# Patient Record
Sex: Female | Born: 1935 | Race: White | Hispanic: No | Marital: Married | Smoking: Never smoker
Health system: Southern US, Community
[De-identification: ages and names within clinical notes are randomized; demographics above are authoritative.]

## PROBLEM LIST (undated history)

## (undated) DIAGNOSIS — K648 Other hemorrhoids: Secondary | ICD-10-CM

## (undated) DIAGNOSIS — K222 Esophageal obstruction: Secondary | ICD-10-CM

## (undated) DIAGNOSIS — K589 Irritable bowel syndrome without diarrhea: Secondary | ICD-10-CM

## (undated) DIAGNOSIS — G20 Parkinson's disease: Secondary | ICD-10-CM

## (undated) DIAGNOSIS — K224 Dyskinesia of esophagus: Secondary | ICD-10-CM

## (undated) DIAGNOSIS — G20A1 Parkinson's disease without dyskinesia, without mention of fluctuations: Secondary | ICD-10-CM

## (undated) DIAGNOSIS — F0390 Unspecified dementia without behavioral disturbance: Secondary | ICD-10-CM

## (undated) DIAGNOSIS — E039 Hypothyroidism, unspecified: Secondary | ICD-10-CM

## (undated) DIAGNOSIS — F419 Anxiety disorder, unspecified: Secondary | ICD-10-CM

## (undated) DIAGNOSIS — K559 Vascular disorder of intestine, unspecified: Secondary | ICD-10-CM

## (undated) DIAGNOSIS — K219 Gastro-esophageal reflux disease without esophagitis: Secondary | ICD-10-CM

## (undated) DIAGNOSIS — E559 Vitamin D deficiency, unspecified: Secondary | ICD-10-CM

## (undated) DIAGNOSIS — N183 Chronic kidney disease, stage 3 unspecified: Secondary | ICD-10-CM

## (undated) DIAGNOSIS — K449 Diaphragmatic hernia without obstruction or gangrene: Secondary | ICD-10-CM

## (undated) DIAGNOSIS — G459 Transient cerebral ischemic attack, unspecified: Secondary | ICD-10-CM

## (undated) DIAGNOSIS — F329 Major depressive disorder, single episode, unspecified: Secondary | ICD-10-CM

## (undated) DIAGNOSIS — K5790 Diverticulosis of intestine, part unspecified, without perforation or abscess without bleeding: Secondary | ICD-10-CM

## (undated) DIAGNOSIS — G4733 Obstructive sleep apnea (adult) (pediatric): Secondary | ICD-10-CM

## (undated) DIAGNOSIS — B159 Hepatitis A without hepatic coma: Secondary | ICD-10-CM

## (undated) DIAGNOSIS — I10 Essential (primary) hypertension: Secondary | ICD-10-CM

## (undated) DIAGNOSIS — J189 Pneumonia, unspecified organism: Secondary | ICD-10-CM

## (undated) DIAGNOSIS — K56609 Unspecified intestinal obstruction, unspecified as to partial versus complete obstruction: Secondary | ICD-10-CM

## (undated) HISTORY — DX: Irritable bowel syndrome, unspecified: K58.9

## (undated) HISTORY — PX: INTRAOCULAR PROSTHESES INSERTION: SHX360

## (undated) HISTORY — DX: Gastro-esophageal reflux disease without esophagitis: K21.9

## (undated) HISTORY — DX: Unspecified intestinal obstruction, unspecified as to partial versus complete obstruction: K56.609

## (undated) HISTORY — DX: Vitamin D deficiency, unspecified: E55.9

## (undated) HISTORY — DX: Chronic kidney disease, stage 3 (moderate): N18.3

## (undated) HISTORY — DX: Essential (primary) hypertension: I10

## (undated) HISTORY — DX: Chronic kidney disease, stage 3 unspecified: N18.30

## (undated) HISTORY — DX: Hypothyroidism, unspecified: E03.9

## (undated) HISTORY — DX: Diaphragmatic hernia without obstruction or gangrene: K44.9

## (undated) HISTORY — DX: Vascular disorder of intestine, unspecified: K55.9

## (undated) HISTORY — DX: Transient cerebral ischemic attack, unspecified: G45.9

## (undated) HISTORY — DX: Parkinson's disease: G20

## (undated) HISTORY — DX: Parkinson's disease without dyskinesia, without mention of fluctuations: G20.A1

## (undated) HISTORY — DX: Esophageal obstruction: K22.2

## (undated) HISTORY — DX: Obstructive sleep apnea (adult) (pediatric): G47.33

## (undated) HISTORY — DX: Unspecified dementia, unspecified severity, without behavioral disturbance, psychotic disturbance, mood disturbance, and anxiety: F03.90

## (undated) HISTORY — PX: BLADDER SURGERY: SHX569

## (undated) HISTORY — PX: CATARACT EXTRACTION: SUR2

## (undated) HISTORY — DX: Other hemorrhoids: K64.8

## (undated) HISTORY — DX: Anxiety disorder, unspecified: F41.9

## (undated) HISTORY — DX: Major depressive disorder, single episode, unspecified: F32.9

## (undated) HISTORY — DX: Pneumonia, unspecified organism: J18.9

## (undated) HISTORY — DX: Diverticulosis of intestine, part unspecified, without perforation or abscess without bleeding: K57.90

## (undated) HISTORY — PX: CORNEAL TRANSPLANT: SHX108

## (undated) HISTORY — DX: Hepatitis a without hepatic coma: B15.9

## (undated) HISTORY — DX: Dyskinesia of esophagus: K22.4

---

## 1966-03-04 HISTORY — PX: TUBAL LIGATION: SHX77

## 1967-03-05 HISTORY — PX: ABDOMINAL HYSTERECTOMY: SHX81

## 1967-03-05 HISTORY — PX: APPENDECTOMY: SHX54

## 1993-03-04 HISTORY — PX: BREAST LUMPECTOMY: SHX2

## 1998-03-04 HISTORY — PX: CHOLECYSTECTOMY: SHX55

## 2001-04-21 ENCOUNTER — Encounter: Payer: Self-pay | Admitting: Gastroenterology

## 2003-03-05 DIAGNOSIS — K222 Esophageal obstruction: Secondary | ICD-10-CM

## 2003-03-05 HISTORY — DX: Esophageal obstruction: K22.2

## 2003-03-17 ENCOUNTER — Encounter: Admission: RE | Admit: 2003-03-17 | Discharge: 2003-03-17 | Payer: Self-pay | Admitting: Internal Medicine

## 2003-07-15 ENCOUNTER — Ambulatory Visit (HOSPITAL_COMMUNITY): Admission: RE | Admit: 2003-07-15 | Discharge: 2003-07-15 | Payer: Self-pay | Admitting: Gastroenterology

## 2003-07-18 ENCOUNTER — Encounter: Payer: Self-pay | Admitting: Gastroenterology

## 2003-07-18 ENCOUNTER — Other Ambulatory Visit: Admission: RE | Admit: 2003-07-18 | Discharge: 2003-07-18 | Payer: Self-pay | Admitting: Gastroenterology

## 2003-10-31 ENCOUNTER — Encounter: Admission: RE | Admit: 2003-10-31 | Discharge: 2003-10-31 | Payer: Self-pay | Admitting: Internal Medicine

## 2004-05-07 ENCOUNTER — Ambulatory Visit: Payer: Self-pay | Admitting: Family Medicine

## 2004-05-24 ENCOUNTER — Ambulatory Visit: Payer: Self-pay | Admitting: Family Medicine

## 2004-08-28 ENCOUNTER — Ambulatory Visit: Payer: Self-pay | Admitting: Family Medicine

## 2004-09-13 ENCOUNTER — Ambulatory Visit: Payer: Self-pay | Admitting: Family Medicine

## 2004-09-28 ENCOUNTER — Encounter: Admission: RE | Admit: 2004-09-28 | Discharge: 2004-09-28 | Payer: Self-pay | Admitting: Family Medicine

## 2004-10-01 ENCOUNTER — Ambulatory Visit: Payer: Self-pay | Admitting: Family Medicine

## 2004-10-08 ENCOUNTER — Ambulatory Visit: Payer: Self-pay

## 2004-12-07 ENCOUNTER — Ambulatory Visit: Payer: Self-pay | Admitting: Internal Medicine

## 2005-02-07 ENCOUNTER — Ambulatory Visit: Payer: Self-pay | Admitting: Family Medicine

## 2005-10-14 ENCOUNTER — Ambulatory Visit: Payer: Self-pay | Admitting: Family Medicine

## 2005-12-19 ENCOUNTER — Ambulatory Visit: Payer: Self-pay | Admitting: Family Medicine

## 2005-12-24 ENCOUNTER — Ambulatory Visit: Payer: Self-pay | Admitting: Family Medicine

## 2005-12-24 LAB — CONVERTED CEMR LAB
AST: 21 units/L (ref 0–37)
BUN: 14 mg/dL (ref 6–23)
CO2: 30 meq/L (ref 19–32)
Calcium: 9 mg/dL (ref 8.4–10.5)
Chloride: 99 meq/L (ref 96–112)
Chol/HDL Ratio, serum: 2.4
Cholesterol: 183 mg/dL (ref 0–200)
Creatinine, Ser: 1.2 mg/dL (ref 0.4–1.2)
GFR calc non Af Amer: 47 mL/min
Glomerular Filtration Rate, Af Am: 56 mL/min/{1.73_m2}
HDL: 75.2 mg/dL (ref >39.0)
Hgb A1c MFr Bld: 5.7 % (ref 4.6–6.0)
Total Bilirubin: 0.5 mg/dL (ref 0.3–1.2)
Triglyceride fasting, serum: 54 mg/dL (ref 0–149)

## 2006-03-18 ENCOUNTER — Ambulatory Visit: Payer: Self-pay | Admitting: Family Medicine

## 2006-03-18 LAB — CONVERTED CEMR LAB
AST: 30 units/L (ref 0–37)
Albumin: 3.8 g/dL (ref 3.5–5.2)
Alkaline Phosphatase: 62 units/L (ref 39–117)
BUN: 9 mg/dL (ref 6–23)
Basophils Relative: 0.8 % (ref 0.0–1.0)
Calcium: 9.7 mg/dL (ref 8.4–10.5)
GFR calc Af Amer: 62 mL/min
GFR calc non Af Amer: 52 mL/min
HCT: 37.5 % (ref 36.0–46.0)
Hemoglobin: 12.6 g/dL (ref 12.0–15.0)
MCHC: 33.6 g/dL (ref 30.0–36.0)
Monocytes Absolute: 0.6 10*3/uL (ref 0.2–0.7)
Monocytes Relative: 9.6 % (ref 3.0–11.0)
Neutrophils Relative %: 53.4 % (ref 43.0–77.0)
RBC: 3.84 M/uL — ABNORMAL LOW (ref 3.87–5.11)
RDW: 11.4 % — ABNORMAL LOW (ref 11.5–14.6)
Total Bilirubin: 0.7 mg/dL (ref 0.3–1.2)
Total Protein: 7 g/dL (ref 6.0–8.3)

## 2006-04-07 ENCOUNTER — Ambulatory Visit: Payer: Self-pay | Admitting: Family Medicine

## 2006-04-07 LAB — CONVERTED CEMR LAB
BUN: 15 mg/dL (ref 6–23)
Basophils Relative: 0.1 % (ref 0.0–1.0)
Calcium: 9.4 mg/dL (ref 8.4–10.5)
Chloride: 100 meq/L (ref 96–112)
Creatinine, Ser: 1 mg/dL (ref 0.4–1.2)
Eosinophils Relative: 4.4 % (ref 0.0–5.0)
Folate: 8.3 ng/mL
Free T4: 0.5 ng/dL — ABNORMAL LOW (ref 0.6–1.6)
GFR calc non Af Amer: 58 mL/min
Monocytes Relative: 7.4 % (ref 3.0–11.0)
Platelets: 139 10*3/uL — ABNORMAL LOW (ref 150–400)
RBC: 3.7 M/uL — ABNORMAL LOW (ref 3.87–5.11)
RDW: 11.7 % (ref 11.5–14.6)
Sodium: 136 meq/L (ref 135–145)
T3, Free: 2.5 pg/mL (ref 2.3–4.2)
TSH: 2.59 microintl units/mL (ref 0.35–5.50)
Vitamin B-12: 234 pg/mL (ref 211–911)
WBC: 6.3 10*3/uL (ref 4.5–10.5)

## 2006-04-14 ENCOUNTER — Encounter: Admission: RE | Admit: 2006-04-14 | Discharge: 2006-04-14 | Payer: Self-pay | Admitting: Family Medicine

## 2006-04-15 ENCOUNTER — Ambulatory Visit: Payer: Self-pay

## 2006-04-17 ENCOUNTER — Ambulatory Visit: Payer: Self-pay | Admitting: Family Medicine

## 2006-05-12 ENCOUNTER — Encounter: Payer: Self-pay | Admitting: Family Medicine

## 2006-06-02 DIAGNOSIS — R42 Dizziness and giddiness: Secondary | ICD-10-CM | POA: Insufficient documentation

## 2006-06-02 DIAGNOSIS — J309 Allergic rhinitis, unspecified: Secondary | ICD-10-CM | POA: Insufficient documentation

## 2006-06-02 DIAGNOSIS — R32 Unspecified urinary incontinence: Secondary | ICD-10-CM | POA: Insufficient documentation

## 2006-06-02 DIAGNOSIS — F329 Major depressive disorder, single episode, unspecified: Secondary | ICD-10-CM

## 2006-06-02 DIAGNOSIS — J45909 Unspecified asthma, uncomplicated: Secondary | ICD-10-CM | POA: Insufficient documentation

## 2006-06-02 DIAGNOSIS — F3289 Other specified depressive episodes: Secondary | ICD-10-CM | POA: Insufficient documentation

## 2006-06-02 DIAGNOSIS — N816 Rectocele: Secondary | ICD-10-CM

## 2006-06-02 DIAGNOSIS — Z9189 Other specified personal risk factors, not elsewhere classified: Secondary | ICD-10-CM

## 2006-06-02 DIAGNOSIS — Z9889 Other specified postprocedural states: Secondary | ICD-10-CM

## 2006-06-02 DIAGNOSIS — I10 Essential (primary) hypertension: Secondary | ICD-10-CM

## 2006-06-02 DIAGNOSIS — Z8709 Personal history of other diseases of the respiratory system: Secondary | ICD-10-CM | POA: Insufficient documentation

## 2006-06-23 ENCOUNTER — Ambulatory Visit: Payer: Self-pay | Admitting: Internal Medicine

## 2006-06-30 ENCOUNTER — Encounter: Payer: Self-pay | Admitting: Family Medicine

## 2006-06-30 ENCOUNTER — Ambulatory Visit: Payer: Self-pay | Admitting: Family Medicine

## 2006-06-30 DIAGNOSIS — J209 Acute bronchitis, unspecified: Secondary | ICD-10-CM | POA: Insufficient documentation

## 2006-07-03 ENCOUNTER — Encounter: Payer: Self-pay | Admitting: Family Medicine

## 2006-07-03 ENCOUNTER — Ambulatory Visit: Payer: Self-pay | Admitting: Family Medicine

## 2006-07-07 ENCOUNTER — Encounter: Payer: Self-pay | Admitting: Family Medicine

## 2006-07-07 ENCOUNTER — Ambulatory Visit: Payer: Self-pay | Admitting: Family Medicine

## 2006-07-16 ENCOUNTER — Ambulatory Visit: Payer: Self-pay | Admitting: Emergency Medicine

## 2006-07-30 ENCOUNTER — Ambulatory Visit: Payer: Self-pay | Admitting: Family Medicine

## 2006-07-30 ENCOUNTER — Encounter: Payer: Self-pay | Admitting: Family Medicine

## 2006-08-11 ENCOUNTER — Encounter: Payer: Self-pay | Admitting: Family Medicine

## 2006-08-12 ENCOUNTER — Telehealth: Payer: Self-pay | Admitting: Family Medicine

## 2006-08-26 ENCOUNTER — Ambulatory Visit: Payer: Self-pay | Admitting: Emergency Medicine

## 2006-08-29 ENCOUNTER — Encounter: Payer: Self-pay | Admitting: Family Medicine

## 2006-09-02 ENCOUNTER — Ambulatory Visit: Payer: Self-pay | Admitting: Family Medicine

## 2006-09-11 ENCOUNTER — Encounter: Payer: Self-pay | Admitting: Family Medicine

## 2006-09-15 ENCOUNTER — Telehealth (INDEPENDENT_AMBULATORY_CARE_PROVIDER_SITE_OTHER): Payer: Self-pay | Admitting: *Deleted

## 2006-09-16 ENCOUNTER — Ambulatory Visit: Payer: Self-pay | Admitting: Family Medicine

## 2006-09-16 ENCOUNTER — Encounter: Admission: RE | Admit: 2006-09-16 | Discharge: 2006-09-16 | Payer: Self-pay | Admitting: Family Medicine

## 2006-09-16 DIAGNOSIS — R413 Other amnesia: Secondary | ICD-10-CM

## 2006-09-17 ENCOUNTER — Encounter (INDEPENDENT_AMBULATORY_CARE_PROVIDER_SITE_OTHER): Payer: Self-pay | Admitting: *Deleted

## 2006-09-18 ENCOUNTER — Telehealth (INDEPENDENT_AMBULATORY_CARE_PROVIDER_SITE_OTHER): Payer: Self-pay | Admitting: *Deleted

## 2006-09-19 ENCOUNTER — Ambulatory Visit: Payer: Self-pay | Admitting: Family Medicine

## 2006-10-01 ENCOUNTER — Ambulatory Visit: Payer: Self-pay | Admitting: Family Medicine

## 2006-10-01 DIAGNOSIS — R55 Syncope and collapse: Secondary | ICD-10-CM

## 2006-10-02 LAB — CONVERTED CEMR LAB
Calcium: 9.2 mg/dL (ref 8.4–10.5)
Chloride: 99 meq/L (ref 96–112)
Creatinine, Ser: 1.1 mg/dL (ref 0.4–1.2)
Eosinophils Absolute: 0.6 10*3/uL (ref 0.0–0.6)
Eosinophils Relative: 6.2 % — ABNORMAL HIGH (ref 0.0–5.0)
GFR calc non Af Amer: 52 mL/min
Glucose, Bld: 88 mg/dL (ref 70–99)
MCV: 95.2 fL (ref 78.0–100.0)
Platelets: 215 10*3/uL (ref 150–400)
RBC: 3.81 M/uL — ABNORMAL LOW (ref 3.87–5.11)
RDW: 12.7 % (ref 11.5–14.6)
TSH: 2.56 microintl units/mL (ref 0.35–5.50)
WBC: 9.4 10*3/uL (ref 4.5–10.5)

## 2006-10-20 ENCOUNTER — Telehealth (INDEPENDENT_AMBULATORY_CARE_PROVIDER_SITE_OTHER): Payer: Self-pay | Admitting: *Deleted

## 2006-11-12 ENCOUNTER — Telehealth (INDEPENDENT_AMBULATORY_CARE_PROVIDER_SITE_OTHER): Payer: Self-pay | Admitting: *Deleted

## 2006-11-13 ENCOUNTER — Ambulatory Visit: Payer: Self-pay | Admitting: Family Medicine

## 2006-11-17 ENCOUNTER — Ambulatory Visit: Payer: Self-pay | Admitting: Critical Care Medicine

## 2006-11-17 ENCOUNTER — Telehealth: Payer: Self-pay | Admitting: Family Medicine

## 2006-11-19 ENCOUNTER — Telehealth (INDEPENDENT_AMBULATORY_CARE_PROVIDER_SITE_OTHER): Payer: Self-pay | Admitting: *Deleted

## 2006-12-20 DIAGNOSIS — Z9079 Acquired absence of other genital organ(s): Secondary | ICD-10-CM | POA: Insufficient documentation

## 2006-12-20 DIAGNOSIS — Z9089 Acquired absence of other organs: Secondary | ICD-10-CM

## 2006-12-20 DIAGNOSIS — K219 Gastro-esophageal reflux disease without esophagitis: Secondary | ICD-10-CM

## 2006-12-22 ENCOUNTER — Ambulatory Visit: Payer: Self-pay | Admitting: Family Medicine

## 2006-12-24 ENCOUNTER — Telehealth (INDEPENDENT_AMBULATORY_CARE_PROVIDER_SITE_OTHER): Payer: Self-pay | Admitting: *Deleted

## 2006-12-25 ENCOUNTER — Ambulatory Visit: Payer: Self-pay | Admitting: Cardiology

## 2006-12-25 ENCOUNTER — Inpatient Hospital Stay (HOSPITAL_COMMUNITY): Admission: EM | Admit: 2006-12-25 | Discharge: 2006-12-27 | Payer: Self-pay | Admitting: Emergency Medicine

## 2006-12-25 ENCOUNTER — Encounter: Payer: Self-pay | Admitting: Family Medicine

## 2006-12-26 ENCOUNTER — Ambulatory Visit: Payer: Self-pay | Admitting: Internal Medicine

## 2006-12-26 ENCOUNTER — Encounter: Payer: Self-pay | Admitting: Family Medicine

## 2006-12-30 ENCOUNTER — Telehealth (INDEPENDENT_AMBULATORY_CARE_PROVIDER_SITE_OTHER): Payer: Self-pay | Admitting: *Deleted

## 2007-01-01 ENCOUNTER — Ambulatory Visit: Payer: Self-pay | Admitting: Internal Medicine

## 2007-01-01 DIAGNOSIS — R269 Unspecified abnormalities of gait and mobility: Secondary | ICD-10-CM | POA: Insufficient documentation

## 2007-01-02 ENCOUNTER — Ambulatory Visit: Payer: Self-pay | Admitting: Internal Medicine

## 2007-01-04 ENCOUNTER — Encounter: Admission: RE | Admit: 2007-01-04 | Discharge: 2007-01-04 | Payer: Self-pay | Admitting: Internal Medicine

## 2007-01-05 ENCOUNTER — Telehealth (INDEPENDENT_AMBULATORY_CARE_PROVIDER_SITE_OTHER): Payer: Self-pay | Admitting: *Deleted

## 2007-01-06 ENCOUNTER — Telehealth (INDEPENDENT_AMBULATORY_CARE_PROVIDER_SITE_OTHER): Payer: Self-pay | Admitting: *Deleted

## 2007-01-08 ENCOUNTER — Encounter: Payer: Self-pay | Admitting: Internal Medicine

## 2007-01-09 ENCOUNTER — Telehealth: Payer: Self-pay | Admitting: Internal Medicine

## 2007-01-13 ENCOUNTER — Ambulatory Visit: Payer: Self-pay | Admitting: Emergency Medicine

## 2007-01-13 ENCOUNTER — Ambulatory Visit: Payer: Self-pay | Admitting: Family Medicine

## 2007-01-14 ENCOUNTER — Encounter: Payer: Self-pay | Admitting: Internal Medicine

## 2007-01-16 ENCOUNTER — Encounter: Payer: Self-pay | Admitting: Internal Medicine

## 2007-01-21 ENCOUNTER — Ambulatory Visit: Payer: Self-pay | Admitting: Family Medicine

## 2007-01-21 DIAGNOSIS — G459 Transient cerebral ischemic attack, unspecified: Secondary | ICD-10-CM | POA: Insufficient documentation

## 2007-01-21 DIAGNOSIS — R197 Diarrhea, unspecified: Secondary | ICD-10-CM | POA: Insufficient documentation

## 2007-01-30 LAB — CONVERTED CEMR LAB
Basophils Relative: 0.6 % (ref 0.0–1.0)
CO2: 31 meq/L (ref 19–32)
Creatinine, Ser: 0.9 mg/dL (ref 0.4–1.2)
Glucose, Bld: 99 mg/dL (ref 70–99)
HCT: 36 % (ref 36.0–46.0)
Hemoglobin: 12.2 g/dL (ref 12.0–15.0)
Monocytes Absolute: 0.9 10*3/uL — ABNORMAL HIGH (ref 0.2–0.7)
Neutrophils Relative %: 67 % (ref 43.0–77.0)
Potassium: 4 meq/L (ref 3.5–5.1)
RDW: 12.5 % (ref 11.5–14.6)
Sodium: 137 meq/L (ref 135–145)
TSH: 4.01 microintl units/mL (ref 0.35–5.50)
Total Bilirubin: 0.6 mg/dL (ref 0.3–1.2)
Total Protein: 6.7 g/dL (ref 6.0–8.3)

## 2007-02-02 ENCOUNTER — Ambulatory Visit (HOSPITAL_COMMUNITY): Admission: RE | Admit: 2007-02-02 | Discharge: 2007-02-02 | Payer: Self-pay | Admitting: Internal Medicine

## 2007-02-02 ENCOUNTER — Telehealth: Payer: Self-pay | Admitting: Family Medicine

## 2007-02-04 ENCOUNTER — Encounter: Payer: Self-pay | Admitting: Family Medicine

## 2007-02-09 ENCOUNTER — Encounter: Payer: Self-pay | Admitting: Family Medicine

## 2007-02-27 ENCOUNTER — Telehealth (INDEPENDENT_AMBULATORY_CARE_PROVIDER_SITE_OTHER): Payer: Self-pay | Admitting: *Deleted

## 2007-03-11 ENCOUNTER — Emergency Department (HOSPITAL_COMMUNITY): Admission: EM | Admit: 2007-03-11 | Discharge: 2007-03-11 | Payer: Self-pay | Admitting: Emergency Medicine

## 2007-04-08 ENCOUNTER — Telehealth (INDEPENDENT_AMBULATORY_CARE_PROVIDER_SITE_OTHER): Payer: Self-pay | Admitting: *Deleted

## 2007-04-27 ENCOUNTER — Encounter: Payer: Self-pay | Admitting: Family Medicine

## 2007-06-22 ENCOUNTER — Encounter: Payer: Self-pay | Admitting: Family Medicine

## 2007-07-30 ENCOUNTER — Emergency Department (HOSPITAL_COMMUNITY): Admission: EM | Admit: 2007-07-30 | Discharge: 2007-07-30 | Payer: Self-pay | Admitting: Emergency Medicine

## 2007-10-07 ENCOUNTER — Ambulatory Visit: Payer: Self-pay | Admitting: Gastroenterology

## 2007-10-07 DIAGNOSIS — R1319 Other dysphagia: Secondary | ICD-10-CM | POA: Insufficient documentation

## 2007-10-14 ENCOUNTER — Telehealth: Payer: Self-pay | Admitting: Gastroenterology

## 2007-11-03 ENCOUNTER — Encounter: Payer: Self-pay | Admitting: Gastroenterology

## 2007-11-03 ENCOUNTER — Other Ambulatory Visit: Admission: RE | Admit: 2007-11-03 | Discharge: 2007-11-03 | Payer: Self-pay | Admitting: Gastroenterology

## 2007-11-03 ENCOUNTER — Ambulatory Visit: Payer: Self-pay | Admitting: Gastroenterology

## 2007-11-05 ENCOUNTER — Telehealth (INDEPENDENT_AMBULATORY_CARE_PROVIDER_SITE_OTHER): Payer: Self-pay

## 2007-11-06 ENCOUNTER — Encounter: Payer: Self-pay | Admitting: Gastroenterology

## 2008-02-22 ENCOUNTER — Telehealth (INDEPENDENT_AMBULATORY_CARE_PROVIDER_SITE_OTHER): Payer: Self-pay | Admitting: *Deleted

## 2008-02-25 ENCOUNTER — Encounter: Payer: Self-pay | Admitting: Family Medicine

## 2008-03-04 DIAGNOSIS — K559 Vascular disorder of intestine, unspecified: Secondary | ICD-10-CM

## 2008-03-04 HISTORY — DX: Vascular disorder of intestine, unspecified: K55.9

## 2008-05-23 ENCOUNTER — Encounter: Payer: Self-pay | Admitting: Family Medicine

## 2008-11-10 ENCOUNTER — Encounter: Payer: Self-pay | Admitting: Gastroenterology

## 2008-11-20 ENCOUNTER — Encounter: Payer: Self-pay | Admitting: Gastroenterology

## 2008-11-23 ENCOUNTER — Encounter: Payer: Self-pay | Admitting: Gastroenterology

## 2008-12-19 ENCOUNTER — Encounter (INDEPENDENT_AMBULATORY_CARE_PROVIDER_SITE_OTHER): Payer: Self-pay | Admitting: *Deleted

## 2008-12-19 ENCOUNTER — Ambulatory Visit: Payer: Self-pay | Admitting: Gastroenterology

## 2008-12-19 DIAGNOSIS — K5730 Diverticulosis of large intestine without perforation or abscess without bleeding: Secondary | ICD-10-CM | POA: Insufficient documentation

## 2008-12-20 ENCOUNTER — Encounter: Payer: Self-pay | Admitting: Gastroenterology

## 2008-12-20 ENCOUNTER — Ambulatory Visit (HOSPITAL_COMMUNITY): Admission: RE | Admit: 2008-12-20 | Discharge: 2008-12-20 | Payer: Self-pay | Admitting: Gastroenterology

## 2008-12-22 ENCOUNTER — Telehealth: Payer: Self-pay | Admitting: Gastroenterology

## 2009-01-09 ENCOUNTER — Emergency Department (HOSPITAL_COMMUNITY): Admission: EM | Admit: 2009-01-09 | Discharge: 2009-01-09 | Payer: Self-pay | Admitting: Emergency Medicine

## 2009-01-23 ENCOUNTER — Telehealth: Payer: Self-pay | Admitting: Gastroenterology

## 2009-04-27 ENCOUNTER — Encounter: Admission: RE | Admit: 2009-04-27 | Discharge: 2009-04-27 | Payer: Self-pay | Admitting: Family Medicine

## 2009-05-16 ENCOUNTER — Emergency Department (HOSPITAL_COMMUNITY): Admission: EM | Admit: 2009-05-16 | Discharge: 2009-05-16 | Payer: Self-pay | Admitting: Emergency Medicine

## 2009-08-01 ENCOUNTER — Emergency Department (HOSPITAL_COMMUNITY): Admission: EM | Admit: 2009-08-01 | Discharge: 2009-08-02 | Payer: Self-pay | Admitting: Emergency Medicine

## 2009-08-09 ENCOUNTER — Encounter: Admission: RE | Admit: 2009-08-09 | Discharge: 2009-08-09 | Payer: Self-pay | Admitting: Neurology

## 2009-09-08 ENCOUNTER — Telehealth: Payer: Self-pay | Admitting: Gastroenterology

## 2009-10-15 ENCOUNTER — Emergency Department (HOSPITAL_COMMUNITY): Admission: EM | Admit: 2009-10-15 | Discharge: 2009-10-15 | Payer: Self-pay | Admitting: Emergency Medicine

## 2010-01-31 ENCOUNTER — Encounter: Admission: RE | Admit: 2010-01-31 | Discharge: 2010-01-31 | Payer: Self-pay | Admitting: Family Medicine

## 2010-02-05 ENCOUNTER — Emergency Department (HOSPITAL_COMMUNITY)
Admission: EM | Admit: 2010-02-05 | Discharge: 2010-02-05 | Payer: Self-pay | Source: Home / Self Care | Admitting: Emergency Medicine

## 2010-02-07 ENCOUNTER — Emergency Department (HOSPITAL_COMMUNITY)
Admission: EM | Admit: 2010-02-07 | Discharge: 2010-02-07 | Payer: Self-pay | Source: Home / Self Care | Admitting: Emergency Medicine

## 2010-02-22 ENCOUNTER — Ambulatory Visit: Payer: Self-pay | Admitting: Gastroenterology

## 2010-02-22 DIAGNOSIS — K2990 Gastroduodenitis, unspecified, without bleeding: Secondary | ICD-10-CM

## 2010-02-22 DIAGNOSIS — R1013 Epigastric pain: Secondary | ICD-10-CM

## 2010-02-22 DIAGNOSIS — K2970 Gastritis, unspecified, without bleeding: Secondary | ICD-10-CM | POA: Insufficient documentation

## 2010-02-28 ENCOUNTER — Telehealth: Payer: Self-pay | Admitting: Gastroenterology

## 2010-03-02 ENCOUNTER — Telehealth: Payer: Self-pay | Admitting: Gastroenterology

## 2010-03-07 ENCOUNTER — Ambulatory Visit: Admit: 2010-03-07 | Payer: Self-pay | Admitting: Gastroenterology

## 2010-04-03 NOTE — Progress Notes (Signed)
Summary: Schedule Colonoscopy   Phone Note Outgoing Call Call back at [facility] Phone [phone]   Call placed by: Harlow Mares CMA Duncan Dull),  September 08, 2009 12:04 PM Call placed to: Patient Summary of Call: Left message on patients machine to call back. patient was due for recall colonoscopy 04/2009  Initial call taken by: Harlow Mares CMA Duncan Dull),  September 08, 2009 12:04 PM  Follow-up for Phone Call        notice in the chart that the patient had a colonoscopy in [place] 11/23/2008, do you wish to change the recall colonoscopy?  Follow-up by: Harlow Mares CMA Duncan Dull),  September 13, 2009 10:17 AM  Additional Follow-up for Phone Call Additional follow up Details #1::        Per my last office note please cancel all colonoscopy recalls. Additional Follow-up by: Meryl Dare MD Clementeen Graham,  September 13, 2009 10:30 AM    Additional Follow-up for Phone Call Additional follow up Details #2::    noted and pt notified Follow-up by: Harlow Mares CMA Duncan Dull),  September 13, 2009 1:43 PM

## 2010-04-04 ENCOUNTER — Other Ambulatory Visit: Payer: Self-pay | Admitting: Urology

## 2010-04-04 ENCOUNTER — Ambulatory Visit (HOSPITAL_COMMUNITY)
Admission: RE | Admit: 2010-04-04 | Discharge: 2010-04-04 | Disposition: A | Discharge status: Home / Self Care | Payer: MEDICARE | Source: Ambulatory Visit | Attending: Urology | Admitting: Urology

## 2010-04-04 DIAGNOSIS — I771 Stricture of artery: Secondary | ICD-10-CM | POA: Insufficient documentation

## 2010-04-04 DIAGNOSIS — R05 Cough: Secondary | ICD-10-CM

## 2010-04-04 DIAGNOSIS — R059 Cough, unspecified: Secondary | ICD-10-CM | POA: Insufficient documentation

## 2010-04-05 NOTE — Progress Notes (Signed)
Summary: Triage   Phone Note Call from Patient Call back at Home Phone [phone]   Caller: Patient Call For: Dr. Russella Dar Reason for Call: Talk to Nurse Summary of Call: bleeding ulcers dx in [place] 8 mths ago, abd pain Initial call taken by: Karna Christmas,  March 02, 2010 3:43 PM  Follow-up for Phone Call        Patient calling to report for the last several days, she has had right sided abdominal pain. She states the pain is worse today. The pain is sharp, achy pain above the belly button. She states she did not have the pain when here on 02/22/10. Denies blood in stool now but states she had dark blood in stool several days ago. She states she has dark brown stools yesterday. She is taking Omeprazole 40 mg daily. Patient states she had "three bleeding ulcers 6-8 months ago at [facility]." Per Dr. Leone Payor- Increase Omeprazole to two times a day this weekend. If pain gets worse or has bleeding, go to ER or Urgent Care over the weekend.  Will need an OV next week with MD or extender. Follow-up by: Jesse Fall RN,  March 02, 2010 4:40 PM  Additional Follow-up for Phone Call Additional follow up Details #1::        Patient aware of above recommendations of Dr. Leone Payor. Patient will increase Omeprazole to two times a day. She understands to go to the ER or Urgent Care if needed over the weekend. We will call her on 03/06/10 to schedule appt. with extender next week. Additional Follow-up by: Jesse Fall RN,  March 02, 2010 4:42 PM     Appended Document: Triage Patient scheduled for 03/07/10 at 10:00 AM with Mike Gip, PA. Patient aware.   Appended Document: Triage Message left for patient to call back to r/s the appointment for tomorrow. Jesse Fall, RN 1/3/112 2:00PM  Appended Document: Triage Spoke with patient to r/s appointment with Mike Gip, PA. Patient does not want to r/s the appointment at this time. She states that when she increased the medication the  stomach pain and bleeding went away. She is having back pain and is seeing her MD for this tomorrow. She feels this is her main problem to focus on now.

## 2010-04-05 NOTE — Assessment & Plan Note (Signed)
Summary: med refill--ch.   History of Present Illness Visit Type: Follow-up Visit Primary GI MD: Elie Goody MD [facility] Primary Provider: Tracey Harries, MD Requesting Provider: Tracey Harries, M.D. Chief Complaint: Med refill, change medication, omeprazole does not agree with patient History of Present Illness:   Mrs. patient relates increased epigastric pain after she was changed to omeprazole. She discontinued omeprazole and notes a fairly constant epigastric pain that does not appear to change with meals, bowel movements or time of day. She has ongoing constipation. Her appetite is good and her weight is stable.   GI Review of Systems    Reports abdominal pain.     Location of  Abdominal pain: epigastric area.    Denies acid reflux, belching, bloating, chest pain, dysphagia with liquids, dysphagia with solids, heartburn, loss of appetite, nausea, vomiting, vomiting blood, weight loss, and  weight gain.      Reports constipation.     Denies anal fissure, black tarry stools, change in bowel habit, diarrhea, diverticulosis, fecal incontinence, heme positive stool, hemorrhoids, irritable bowel syndrome, jaundice, light color stool, liver problems, rectal bleeding, and  rectal pain.   Current Medications (verified): 1)  Singulair 10 Mg Tabs (Montelukast Sodium) .Marland Kitchen.. 1 Tablet By Mouth Once Daily 2)  Atenolol 25 Mg Tabs (Atenolol) .... Take 1 Tablet By Mouth Once Daily 3)  Imipramine Hcl 50 Mg  Tabs (Imipramine Hcl) .Marland Kitchen.. 1 Tablet Po  At Bedtime 4)  Nebulizer   Misc (Nebulizers) .... As Directed 5)  Albuterol Sulfate (2.5 Mg/73ml) 0.083%  Nebu (Albuterol Sulfate) .Marland Kitchen.. 1 Via Neb Qid As Needed 6)  Symbicort 160-4.5 Mcg/act  Aero (Budesonide-Formoterol Fumarate) .... 2 Puffs Two Times A Day 7)  Lorazepam 1 Mg  Tabs (Lorazepam) .Marland Kitchen.. 1 Tablet By Mouth Hree Times A Day As Needed 8)  Aspirin 81 Mg Tbec (Aspirin) .Marland Kitchen.. 1 Tablet By Mouth Once Daily 9)  Ropinirole Hcl 2 Mg Tabs (Ropinirole Hcl) .Marland Kitchen.. 1  Tablet By Mouth Three Times A Day 10)  Carbidopa-Levodopa 25-100 Mg Tabs (Carbidopa-Levodopa) .Marland Kitchen.. 1 Tablet By Mouth Three Times A Day 11)  Perphenazine 4 Mg Tabs (Perphenazine) .... Take 1 Tablet By Mouth Once Daily 12)  Sanctura Xr 60 Mg Xr24h-Cap (Trospium Chloride) .... Take 1 Capsule By Mouth Once Daily  Allergies (verified): 1)  ! Penicillin 2)  ! Erythromycin  Past History:  Past Medical History: Reviewed history from 12/19/2008 and no changes required. Allergic rhinitis Asthma Depression Dizziness or vertigo Hypertension Pneumonia, hx of Urinary incontinence Hiatal Hernia GERD Transient ischemic attacks Esophageal Stricture Diverticulosis Hepatitis A Hypothyroidism Parkinson's Disease Ischemic colitis 2010  Past Surgical History: Reviewed history from 12/19/2008 and no changes required. Hysterectomy (1969) Cholecystectomy (2000) Tubal [phone] Appendectomy(1969) Lt. Eye Implant at [facility] History: Reviewed history from 12/19/2008 and no changes required. Lung cancer: Father died at 20. Sister died at 82 with Cherlynn Polo disease Family History of Liver Disease/Cirrhosis:sister Family History of Colon Cancer:  Social History: Reviewed history from 10/07/2007 and no changes required. Married Retired Patient has never smoked.  Alcohol Use - no Daily Caffeine Use very rarely  Review of Systems       The patient complains of allergy/sinus, arthritis/joint pain, cough, fatigue, shortness of breath, and urination - excessive.         The pertinent positives and negatives are noted as above and in the HPI. All other ROS were reviewed and were negative.   Vital Signs:  Patient profile:   77 year old  female Height:      60 inches Weight:      150.38 pounds BMI:     29.48 Pulse rate:   80 / minute Pulse rhythm:   regular BP sitting:   164 / 82  (left arm) Cuff size:   regular  Vitals Entered By: June McMurray CMA Duncan Dull) (February 22, 2010  9:10 AM)  Physical Exam  General:  Well developed, well nourished, no acute distress. In a wheelchair. Head:  Normocephalic and atraumatic. Eyes:  PERRLA, no icterus. Mouth:  No deformity or lesions, dentition normal. Lungs:  Clear throughout to auscultation. Heart:  Regular rate and rhythm; no murmurs, rubs,  or bruits. Abdomen:  Soft, nontender and nondistended. No masses, hepatosplenomegaly or hernias noted. Normal bowel sounds. Neurologic:  Alert and  oriented x4;  grossly normal neurologically.  Impression & Recommendations:  Problem # 1:  ABDOMINAL PAIN-EPIGASTRIC (ICD-789.06) Epigastric pain exacerbated by omeprazole. I presume this is gastritis or reflux with omeprazole side effects. She appeared to tolerate pantoprazole well in the past, and therefore, will change to pantoprazole. If her epigastric pain does not adequately resolve, she will contact us for further followup.  Problem # 2:  GASTROESOPHAGEAL REFLUX DISEASE (ICD-530.81) See problem #1.  Problem # 3:  GASTRITIS (ICD-535.50) See problem #1.  Patient Instructions: 1)  Your prescription has been sent to your pharmacy.  2)  Please schedule a follow-up appointment as needed.  3)  Copy sent to : Tracey Harries, MD 4)  The medication list was reviewed and reconciled.  All changed / newly prescribed medications were explained.  A complete medication list was provided to the patient / caregiver.  Prescriptions: PANTOPRAZOLE SODIUM 40 MG TBEC (PANTOPRAZOLE SODIUM) one tablet by mouth once daily  #30 x 11   Entered by:   Christie Nottingham CMA (AAMA)   Authorized by:   Meryl Dare MD [facility]   Signed by:   Meryl Dare MD [facility] on 02/22/2010   Method used:   Electronically to        [facility]* (retail)       [address] [place], [place]  [address]       Ph: [phone]       Fax: [phone]   RxID:   [id number]

## 2010-04-05 NOTE — Progress Notes (Signed)
Summary: can not retain medicine ordered  Medications Added OMEPRAZOLE 40 MG CPDR (OMEPRAZOLE) one tablet by mouth once daily       Phone Note Call from Patient   Caller: spouseMolly Maduro [phone] Call For: Dr Russella Dar Reason for Call: Refill Medication Summary of Call: Cannot retain Pantoprazole and would like to speak to nurse. Initial call taken by: Leanor Kail [facility],  February 28, 2010 10:23 AM  Follow-up for Phone Call        Pt states she has tried pantoprazole for several weeks and this medication does not work as well as the one before she has tried. She also states that pantoprazole made her stomach ache and she would really like to be switched back. Told pt to stop the pantoprazole and I will send the omeprazole back into the pharmacy. Pt verbalized understanding.  Follow-up by: Christie Nottingham CMA Duncan Dull),  February 28, 2010 10:42 AM    New/Updated Medications: OMEPRAZOLE 40 MG CPDR (OMEPRAZOLE) one tablet by mouth once daily Prescriptions: OMEPRAZOLE 40 MG CPDR (OMEPRAZOLE) one tablet by mouth once daily  #30 x 11   Entered by:   Christie Nottingham CMA (AAMA)   Authorized by:   Meryl Dare MD [facility]   Signed by:   Christie Nottingham CMA Duncan Dull) on 02/28/2010   Method used:   Electronically to        [facility]* (retail)       [address] [place], [place]  [address]       Ph: [phone]       Fax: [phone]   RxID:   [id number]

## 2010-05-15 LAB — POCT I-STAT, CHEM 8
Creatinine, Ser: 1.2 mg/dL (ref 0.4–1.2)
Hemoglobin: 13.3 g/dL (ref 12.0–15.0)
Sodium: 136 mEq/L (ref 135–145)
TCO2: 27 mmol/L (ref 0–100)

## 2010-05-21 LAB — CBC
HCT: 33.7 % — ABNORMAL LOW (ref 36.0–46.0)
Hemoglobin: 11.5 g/dL — ABNORMAL LOW (ref 12.0–15.0)
MCV: 91.2 fL (ref 78.0–100.0)
RBC: 3.7 MIL/uL — ABNORMAL LOW (ref 3.87–5.11)
WBC: 6.8 10*3/uL (ref 4.0–10.5)

## 2010-05-21 LAB — GLUCOSE, CAPILLARY: Glucose-Capillary: 119 mg/dL — ABNORMAL HIGH (ref 70–99)

## 2010-05-21 LAB — URINALYSIS, ROUTINE W REFLEX MICROSCOPIC
Hgb urine dipstick: NEGATIVE
Protein, ur: NEGATIVE mg/dL
Urobilinogen, UA: 0.2 mg/dL (ref 0.0–1.0)

## 2010-05-21 LAB — DIFFERENTIAL
Eosinophils Absolute: 0.5 10*3/uL (ref 0.0–0.7)
Eosinophils Relative: 8 % — ABNORMAL HIGH (ref 0–5)
Lymphs Abs: 1.9 10*3/uL (ref 0.7–4.0)
Monocytes Absolute: 0.8 10*3/uL (ref 0.1–1.0)
Monocytes Relative: 12 % (ref 3–12)

## 2010-05-21 LAB — URINE MICROSCOPIC-ADD ON

## 2010-05-21 LAB — BASIC METABOLIC PANEL
BUN: 14 mg/dL (ref 6–23)
Chloride: 95 mEq/L — ABNORMAL LOW (ref 96–112)
Potassium: 3.6 mEq/L (ref 3.5–5.1)
Sodium: 129 mEq/L — ABNORMAL LOW (ref 135–145)

## 2010-05-22 ENCOUNTER — Emergency Department (HOSPITAL_COMMUNITY)
Admission: EM | Admit: 2010-05-22 | Discharge: 2010-05-22 | Disposition: A | Discharge status: Home / Self Care | Payer: MEDICARE | Attending: Emergency Medicine | Admitting: Emergency Medicine

## 2010-05-22 ENCOUNTER — Emergency Department (HOSPITAL_COMMUNITY): Payer: MEDICARE

## 2010-05-22 DIAGNOSIS — M542 Cervicalgia: Secondary | ICD-10-CM | POA: Insufficient documentation

## 2010-05-22 DIAGNOSIS — E039 Hypothyroidism, unspecified: Secondary | ICD-10-CM | POA: Insufficient documentation

## 2010-05-22 DIAGNOSIS — F3289 Other specified depressive episodes: Secondary | ICD-10-CM | POA: Insufficient documentation

## 2010-05-22 DIAGNOSIS — M25469 Effusion, unspecified knee: Secondary | ICD-10-CM | POA: Insufficient documentation

## 2010-05-22 DIAGNOSIS — R220 Localized swelling, mass and lump, head: Secondary | ICD-10-CM | POA: Insufficient documentation

## 2010-05-22 DIAGNOSIS — S022XXA Fracture of nasal bones, initial encounter for closed fracture: Secondary | ICD-10-CM | POA: Insufficient documentation

## 2010-05-22 DIAGNOSIS — G20 Parkinson's disease: Secondary | ICD-10-CM | POA: Insufficient documentation

## 2010-05-22 DIAGNOSIS — I10 Essential (primary) hypertension: Secondary | ICD-10-CM | POA: Insufficient documentation

## 2010-05-22 DIAGNOSIS — Y92009 Unspecified place in unspecified non-institutional (private) residence as the place of occurrence of the external cause: Secondary | ICD-10-CM | POA: Insufficient documentation

## 2010-05-22 DIAGNOSIS — Z7982 Long term (current) use of aspirin: Secondary | ICD-10-CM | POA: Insufficient documentation

## 2010-05-22 DIAGNOSIS — Z79899 Other long term (current) drug therapy: Secondary | ICD-10-CM | POA: Insufficient documentation

## 2010-05-22 DIAGNOSIS — S0003XA Contusion of scalp, initial encounter: Secondary | ICD-10-CM | POA: Insufficient documentation

## 2010-05-22 DIAGNOSIS — G20A1 Parkinson's disease without dyskinesia, without mention of fluctuations: Secondary | ICD-10-CM | POA: Insufficient documentation

## 2010-05-22 DIAGNOSIS — M25569 Pain in unspecified knee: Secondary | ICD-10-CM | POA: Insufficient documentation

## 2010-05-22 DIAGNOSIS — R221 Localized swelling, mass and lump, neck: Secondary | ICD-10-CM | POA: Insufficient documentation

## 2010-05-22 DIAGNOSIS — W108XXA Fall (on) (from) other stairs and steps, initial encounter: Secondary | ICD-10-CM | POA: Insufficient documentation

## 2010-05-22 DIAGNOSIS — S51009A Unspecified open wound of unspecified elbow, initial encounter: Secondary | ICD-10-CM | POA: Insufficient documentation

## 2010-05-22 DIAGNOSIS — J45909 Unspecified asthma, uncomplicated: Secondary | ICD-10-CM | POA: Insufficient documentation

## 2010-05-22 DIAGNOSIS — F329 Major depressive disorder, single episode, unspecified: Secondary | ICD-10-CM | POA: Insufficient documentation

## 2010-05-22 DIAGNOSIS — R51 Headache: Secondary | ICD-10-CM | POA: Insufficient documentation

## 2010-05-22 DIAGNOSIS — M25529 Pain in unspecified elbow: Secondary | ICD-10-CM | POA: Insufficient documentation

## 2010-05-22 DIAGNOSIS — S8000XA Contusion of unspecified knee, initial encounter: Secondary | ICD-10-CM | POA: Insufficient documentation

## 2010-05-22 DIAGNOSIS — K219 Gastro-esophageal reflux disease without esophagitis: Secondary | ICD-10-CM | POA: Insufficient documentation

## 2010-05-23 ENCOUNTER — Ambulatory Visit: Payer: MEDICARE | Admitting: Gastroenterology

## 2010-06-28 ENCOUNTER — Other Ambulatory Visit (HOSPITAL_COMMUNITY): Payer: Self-pay | Admitting: Gastroenterology

## 2010-06-28 ENCOUNTER — Ambulatory Visit (INDEPENDENT_AMBULATORY_CARE_PROVIDER_SITE_OTHER): Payer: Medicare Other | Admitting: Gastroenterology

## 2010-06-28 ENCOUNTER — Encounter: Payer: Self-pay | Admitting: Gastroenterology

## 2010-06-28 VITALS — BP 90/50 | HR 72 | Ht 62.0 in | Wt 154.0 lb

## 2010-06-28 DIAGNOSIS — K5900 Constipation, unspecified: Secondary | ICD-10-CM

## 2010-06-28 DIAGNOSIS — K219 Gastro-esophageal reflux disease without esophagitis: Secondary | ICD-10-CM

## 2010-06-28 DIAGNOSIS — R1319 Other dysphagia: Secondary | ICD-10-CM

## 2010-06-28 NOTE — Progress Notes (Signed)
History of Present Illness: This is a 77 year old female who is with her husband with several complaints. She has intermittent solid food dysphagia, intermittent reflux symptoms, occasional regurgitation after meals and constipation requiring occasional laxative usage. She underwent colonoscopy and BE at [facility] in 11/2008 for ischemic colitis. She has had prior upper endoscopies, most recently in 2009, and has known GERD and a prior history of a peptic stricture. She has worsening Parkinson's disease for the past 2 years.  Current Medications, Allergies, Past Medical History, Past Surgical History, Family History and Social History were reviewed in [employer] record.  Physical Exam: General: Well developed , well nourished, no acute distress. Debilitated in a wheelchair. Head: Normocephalic and atraumatic Eyes:  sclerae anicteric, EOMI Ears: Normal auditory acuity Mouth: No deformity or lesions Lungs: Clear throughout to auscultation Heart: Regular rate and rhythm; no murmurs, rubs or bruits Abdomen: Soft, non tender and non distended. No masses, hepatosplenomegaly or hernias noted. Normal Bowel sounds Musculoskeletal: Symmetrical with no gross deformities  Pulses:  Normal pulses noted Extremities: No clubbing, cyanosis, edema or deformities noted Neurological: Alert oriented x 4, generalized muscle weakness and slow movements consistent with Parkinson's disease Psychological:  Alert and cooperative. Normal mood and affect  Assessment and Recommendations:  1. GERD. With regurgitation. Intensify all antireflux measures. Trial of Dexilant 60 mg daily in place of omeprazole.  2. Dysphagia. I suspect one component is oropharyngeal dysphagia and is related to Parkinson's disease. Rule out a recurrent peptic stricture. Schedule barium esophagram and modified barium swallow study. If an esophageal stricture is noted will plan to proceed with upper endoscopy and  dilation. If her symptoms are oropharyngeal in nature, related to Parkinson's disease, further management per her neurologist and per recommendations made by speech pathology.  3. Chronic constipation. Likely related to limited mobility, Parkinson's disease and medication side effects. Continue laxative use as needed.

## 2010-06-28 NOTE — Patient Instructions (Addendum)
You have been scheduled for a Barium Swallow on 07/04/10 at 9:00am and a Modified Barium Swallow on 07/04/10 at 11:00am at [facility] 1st floor admitting department. Please arrive at 8:45am for check in and nothing to eat or drink after midnight.  Cc: Storm Frisk, MD         Kenney Houseman, MD

## 2010-06-29 ENCOUNTER — Telehealth: Payer: Self-pay

## 2010-06-29 NOTE — Telephone Encounter (Signed)
Called pt with no answer and no voicemail to inform her of her appt date and time for her Modified Barium Swallow and Barium Swallow on 07/05/10 at 9:45am.

## 2010-07-02 NOTE — Telephone Encounter (Signed)
Notified pt of appt date and time of MBS and BS. Pt's husband verbalized understanding of both appts.

## 2010-07-03 ENCOUNTER — Other Ambulatory Visit (HOSPITAL_COMMUNITY): Payer: Medicare Other

## 2010-07-03 ENCOUNTER — Telehealth: Payer: Self-pay | Admitting: Gastroenterology

## 2010-07-03 NOTE — Telephone Encounter (Signed)
I have left a voicemail for the patient to call back to reschedule asap.  I have canceled with radiology and speech therapy

## 2010-07-04 ENCOUNTER — Other Ambulatory Visit (HOSPITAL_COMMUNITY): Payer: Medicare Other

## 2010-07-04 ENCOUNTER — Ambulatory Visit (HOSPITAL_COMMUNITY): Payer: Medicare Other

## 2010-07-05 ENCOUNTER — Other Ambulatory Visit (HOSPITAL_COMMUNITY): Payer: Medicare Other

## 2010-07-05 ENCOUNTER — Ambulatory Visit (HOSPITAL_COMMUNITY): Payer: Medicare Other

## 2010-07-05 ENCOUNTER — Telehealth: Payer: Self-pay | Admitting: Gastroenterology

## 2010-07-05 NOTE — Telephone Encounter (Signed)
Patient is rescheduled for 07/17/10 11:00 Modified barium swallow and barium swallow at  9:30.  Patient is advised.

## 2010-07-06 ENCOUNTER — Other Ambulatory Visit (HOSPITAL_COMMUNITY): Payer: Self-pay | Admitting: Family Medicine

## 2010-07-17 ENCOUNTER — Ambulatory Visit (HOSPITAL_COMMUNITY)
Admission: RE | Admit: 2010-07-17 | Discharge: 2010-07-17 | Disposition: A | Discharge status: Home / Self Care | Payer: Medicare Other | Source: Ambulatory Visit | Attending: Gastroenterology | Admitting: Gastroenterology

## 2010-07-17 ENCOUNTER — Ambulatory Visit (HOSPITAL_COMMUNITY)
Admission: RE | Admit: 2010-07-17 | Discharge: 2010-07-17 | Disposition: A | Discharge status: Home / Self Care | Payer: Medicare Other | Source: Ambulatory Visit | Attending: Family Medicine | Admitting: Family Medicine

## 2010-07-17 ENCOUNTER — Other Ambulatory Visit (HOSPITAL_COMMUNITY): Payer: Medicare Other

## 2010-07-17 DIAGNOSIS — R1310 Dysphagia, unspecified: Secondary | ICD-10-CM | POA: Insufficient documentation

## 2010-07-17 DIAGNOSIS — K219 Gastro-esophageal reflux disease without esophagitis: Secondary | ICD-10-CM | POA: Insufficient documentation

## 2010-07-17 DIAGNOSIS — K224 Dyskinesia of esophagus: Secondary | ICD-10-CM | POA: Insufficient documentation

## 2010-07-17 DIAGNOSIS — G20A1 Parkinson's disease without dyskinesia, without mention of fluctuations: Secondary | ICD-10-CM | POA: Insufficient documentation

## 2010-07-17 DIAGNOSIS — G20 Parkinson's disease: Secondary | ICD-10-CM | POA: Insufficient documentation

## 2010-07-17 DIAGNOSIS — R112 Nausea with vomiting, unspecified: Secondary | ICD-10-CM | POA: Insufficient documentation

## 2010-07-17 NOTE — Assessment & Plan Note (Signed)
[facility]                             PULMONARY OFFICE NOTE   NAME:Deanna Page, Deanna Page                    MRN:          [id number]  DATE:11/17/2006                            DOB:          Jul 06, 1929    PULMONARY WORK-IN OFFICE NOTE.   Ms. Deanna Page is seen today as a work-in.  A 77 year old white female  previously labeled as having reflux disease only with chronic dyspnea,  no significant airflow obstruction by physical exam.  She comes in today  still coughing, wheezing, bringing up yellow mucus for 3 weeks, having  increased shortness of breath, no fever.  Primary care physician just  gave the patient 400 mg daily of Avelox which she just completed.  She  is short of breath, having increased cough.  She is on no inhaled  medications.  Her maintenance program includes Prilosec 20 mg daily,  Risperdal 2 mg h.s., Premarin daily, Singulair 10 mg daily, Norvasc 5 mg  daily, imipramine 150 mg h.s., Lorazepam 1 mg t.i.d. p.r.n.  She is  still having breakthrough heartburn despite being on daily Prilosec.   EXAM:  Temp 97, blood pressure 124/70, pulse 98, saturation 95% room  air.  CHEST:  Inspiratory and expiratory wheeze, scattered rhonchi.  CARDIAC EXAM:  A regular rate and rhythm without S3, normal S1 S2.  ABDOMEN:  Soft, nontender.  EXTREMITIES:  No edema, clubbing or venous disease.  SKIN:  Clear.  NEUROLOGIC EXAM:  Intact.  HEENT EXAM:  No jugular venous distention, no lymphadenopathy,  oropharynx clear.  NECK:  Supple.   LABORATORY DATA:  His pulmonary functions show severe obstructive defect  with an FEV1 of 42% predicted, FVC of 50% predicted, FEV1/FVC ratio of  81% but the FEF25/75 is low at 29% predicted, compatible with severe  obstructive defect.   IMPRESSION:  Asthmatic bronchitis with clear evidence of chronic airflow  obstruction.  Despite the fact the patient was never a smoker, I am  labeling her as severe asthma with acute  bronchitis exacerbated by  reflux disease.   PLAN:  1. Switch Prilosec to Zegerid 40 mg daily.  2. The patient was given a reflux diet and we went over this in      detail.  3. Initiate Symbicort 160/4.5 two sprays b.i.d.  4. Pulse prednisone 40 mg with a rapid taper.  5. Administer Levaquin 750 mg for 5 days.  6. The patient will return in 2 weeks for a recheck with Dr. Delton Coombes.     Charlcie Cradle Delford Field, MD, [facility]  Electronically Signed    PEW/MedQ  DD: 11/18/2006  DT: 11/18/2006  Job #: [id number]   cc:   Leslye Peer, MD  Lelon Perla, DO

## 2010-07-17 NOTE — Assessment & Plan Note (Signed)
[facility]                             PULMONARY OFFICE NOTE   NAME:Stoke, ALPHA CHOUINARD                    MRN:          [id number]  DATE:08/26/2006                            DOB:          [birth year]    SUBJECTIVE:  Ms. Chahal is a 77 year old woman with a possible history  of asthma, diagnosed clinically 10 years ago.  She presented to see me  in May for chronic cough and frequent episodes of mucopurulent  bronchitis.  She returns today for a regularly scheduled followup visit.  She was supposed to have pulmonary function tests today but  unfortunately she was unable to perform the test.  In particular, she  was unable to blow out for greater than 6 seconds which would be more  consistent with restrictive disease than with obstructive disease.  She  tells me that her breathing is doing quite well and her cough is much  improved.  She believes that it has improved following discontinuation  of her Singulair.  Her cough is less frequent and is only occasionally  productive.  She has had some problems with allergic rhinitis during the  spring but this is getting better.  She is not currently using any  medications for allergies.  She has also had problems with GERD and uses  Prilosec 20 mg daily.   MEDICATIONS:  1. Imipramine 150 mg q.h.s.  2. Norvasc 5 mg daily.  3. Premarin 0.625 mg daily.  4. __________ 1000 mcg daily.  5. Risperdal 1 mg q.h.s. and 0.5 mg q.a.m.  6. Prilosec 20 mg daily.  7. Lorazepam 1 mg t.i.d. p.r.n.   PHYSICAL EXAMINATION:  GENERAL:  This is a pleasant, elderly woman in no  distress.  Weight is 164 pounds, temperature 98.1, blood pressure  124/78, heart rate is 96, SPO2 93% on room air.  HEENT EXAM:  The posterior pharynx is benign with only some mild  erythema.  NECK:  Supple without lymphadenopathy or stridor.  LUNGS:  Clear to auscultation bilaterally.  HEART:  Regular rate and rhythm without murmur.  ABDOMEN:   Obese but soft, nontender with positive bowel sounds.  EXTREMITIES:  No clubbing, cyanosis or edema.   IMPRESSION:  1. Chronic cough.  2. Possible history of airflow limitation. She was unable to do      pulmonary function testing but given the fact that she was unable      to blow for 6 seconds, and her clinical history which is      inconsistent with chronic FO limitation, I doubt that she has true      asthma.  3. Gastroesophageal reflux disease which may be factor contributing to      her cough.  4. Allergic rhinitis which is probably another factor contributing to      her cough.   PLAN:  1. I will not start her on any bronchodilator's.  Have asked her to      use her Prilosec 20 mg daily and to use it twice a day for 1 month      at periods of time when  her cough is worsening.  2. I have asked her to consider using Claritin, a nasal steroid and      possible nasal saline washes when her allergies flare next spring.  3. She will followup with Dr. Laury Axon and with me on an as needed basis.     Leslye Peer, MD  Electronically Signed    RSB/MedQ  DD: 08/26/2006  DT: 08/26/2006  Job #: [id number]   cc:   Lelon Perla, DO

## 2010-07-17 NOTE — Consult Note (Signed)
NAMECAROLLEE, NUSSBAUMER             ACCOUNT NO.:  [id number]   MEDICAL RECORD NO.:  [id number]          PATIENT TYPE:  INP   LOCATION:  6743                         FACILITY:  [facility]   PHYSICIAN:  Corwin Levins, MD      DATE OF BIRTH:  05-30-1929   DATE OF CONSULTATION:  DATE OF DISCHARGE:  12/27/2006                                 CONSULTATION   DISCHARGE DIAGNOSES:  Include:  1. Asthmatic bronchitis, questionable left lower lobe pneumonia.  2. Dysphagia by modified barium swallow, question if exacerbated by      breathing symptoms.  3. Questionable recurrent episodes versus weakness and falls secondary      to #1.  4. Gastroesophageal reflux disease.  5. Hypertension.  6. History asthma.  7. Questionable obstructive sleep apnea.  8. Status post cholecystectomy.  9. Status post total abdominohysterectomy.  10.Status post appendectomy.  11.Status post hysterectomy.   PROCEDURE:  CT angio of the chest:  No evidence of pulmonary embolism  December 25, 2006.   CONSULTS:  None.   HISTORY AND PHYSICAL:  See that dictated date of admission, December 25, 2006, per Dr. Felicity Coyer.   HOSPITAL COURSE:  Ms. Banbury is a 77 year old white female who  presented with recurrent falls of unclear etiology.  CT of the head was  negative.  Seizure was very much doubted because of lack of actual loss  of consciousness or amnesia event; there was no jerking and evidence;  therefore, EEG was not pursued.  A 2-D echocardiogram was obtained which  was essentially within normal limits, no wall motion abnormalities and  normal ejection fraction.  Telemetry proved normal throughout her  hospitalization.  Her main symptoms otherwise surrounded asthma  exacerbation.  There was some sense that possibly hypoxia may have  contributed to her weakness and falls.  There were no falls or evidence  of weakness thereafter.  When she was hospitalized, she was treated in  the usual fashion with IV fluids,  antibiotics, nebulizer treatments to  which she responded rapidly.  She was changed to p.o. Avelox, breathing  treatments, O2 and p.o. prednisone.  At the time of discharge, she was  doing quite nicely, ambulatory without difficulty, no longer hypoxic  with few wheezes evident, and she was tolerating her other medications  well.  As she was in this condition, we felt she had gained maximum  benefit from this hospitalization and is to be discharged home.  It is  to be noted that she had some difficulty with swallowing initially and  due to the brief hospitalization, there was no followup on the MBS that  showed some swallowing delay with chin tuck; there was some silent  penetration of thin liquids.  Recommendation at the time was to change  liquids to nectar-thick, medications full and pureed and small bites and  sips otherwise.  It was felt that she might show some improvement with  treatment otherwise and it is felt this would not delay her discharge.  Suggestion was made by Dr. Felicity Coyer that she might pursue home health  speech therapy to follow up after  discharge.   DISPOSITION:  Discharge to home in good condition.  There are no  activity or dietary restrictions besides that on admission with low  cholesterol, low-sodium diet.  She will follow up with Dr. Lysbeth Galas, her  primary care physician, within one to two weeks.   DISCHARGE MEDICATIONS:  To include:  1. Avelox 400 mg p.o. q.day for eight days.  2. Prednisone taper.  3. Citalopram 20 mg p.o. q.day.  4. Amlodipine 5 mg p.o. q.day  5.Singulairri 10 mg p.o. q.day.  1. Imipramine 150 mg q.h.s.  2. Enablex 15 mg p.o. q.day.  3. Premarin 0.625 mg p.o. q.day.  4. Risperdal mg q.h.s.  5. Lorazepam 1 mg t.i.d. p.r.n.      Corwin Levins, MD  Electronically Signed     JWJ/MEDQ  D:  12/27/2006  T:  12/27/2006  Job:  [id number]

## 2010-07-17 NOTE — H&P (Signed)
NAME:  Deanna Page, Deanna Page                 ACCOUNT NO.:  o   MEDICAL RECORD NO.:  [id number]          PATIENT TYPE:  EMS   LOCATION:  MAJO                         FACILITY:  [facility]   PHYSICIAN:  Valerie A. Felicity Coyer, MDDATE OF BIRTH:  [birth year]   DATE OF ADMISSION:  12/25/2006  DATE OF DISCHARGE:                              HISTORY & PHYSICAL   PRIMARY CARE PHYSICIAN:  Lelon Perla, D.O.   PULMONOLOGIST:  Leslye Peer, M.D.   CHIEF COMPLAINT:  Recurrent falls, cough.   HISTORY OF PRESENT ILLNESS:  The patient is a 77 year old white female  with history of severe reflux disease prompting bronchitic asthma who  presents to the emergency room today with several complaints.  The first  complaint is that of recurrent falls.  She reports that yesterday while  walking out of the drug store to her car, she suddenly fell without  warning signs of her legs giving out and hit her head against the  concrete.  She had no loss of consciousness, no loss of bowel or  bladder, no evidence of seizure or history of seizure disorder.  She was  able to pick herself up and had no amnesia of the event and subsequently  went home.  The day prior to that, she had a similar event that occurred  while walking to the mailbox.  She reports on route to the mailbox from  her house, she suddenly began to feel as if her legs were giving way.  Says she remembers falling but could not stop herself from doing so and  landed on the ground.  There was no injury known at that time, and  again, she was able to pick refill up and proceed on with her chores.  When asked if she had previous other attacks such as this, she reports  approximately 6 months ago a single event while walking across the  street to a neighbor where she fell down without symptoms.  Because of  concerns regarding the increase of this attacks, she came to the  emergency room for evaluation, specifically concerned that something was  wrong with  her head.   She also notes she was seen by her primary care physician on Monday  prior to these falls for increasing cough.  She was placed on Levaquin  as well as a prednisone taper by her primary M.D. due to concerns for  asthmatic bronchitis.  She says she has been having increasing amounts  of yellow phlegm every morning but no specific fevers or chills.  She  does have chest pain with coughing but no travel history, no leg  swelling, no pleuritic-type pain.   PAST MEDICAL HISTORY:  1. Hypertension.  2. Asthmatic bronchitis.  3. Possible obstructive sleep apnea.  4. GERD.  5. History of anxiety and depression.   PAST SURGICAL HISTORY:  1. Recent collagen injections into her bladder by Dr. Annabell Howells 2 weeks      ago and 8 weeks ago.  2. Status post cholecystectomy in 2000 secondary to cholecystitis.  3. Status post hysterectomy and 1969 and bilateral  tubal ligation in      1968.  4. Status post appendectomy in 1969.   CURRENT MEDICATIONS:  1. Celexa 20 mg daily.  2. Levaquin 750 mg daily x3 days.  3. Norvasc 5 mg daily.  4. Ativan 1 mg t.i.d. p.r.n.  5. Prednisone taper, currently at 30 mg daily since December 22, 2006.  6. Risperdal 2 mg q.h.s.  7. Singulair 10 mg daily p.r.n.  8. Premarin 0.625 mg daily.  9. Imipram 150 mg q.h.s.  10.Enablex 15 mg daily.   ALLERGIES:  SHE IS ALLERGIC TO PENICILLIN AND ERYTHROMYCIN BOTH OF WHICH  CAUSE RASH.   FAMILY HISTORY:  Brother with heart disease, another brother with  asthma, and a sister with an unknown type of cancer.   SOCIAL HISTORY:  No history of tobacco or alcohol.  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   REVIEW OF SYSTEMS:  Please see HPI above for pertinent positives.  She  also notes chronic dyspnea on exertion.  She notes reflux with her  regurgitation, specifically noting that solid foods stick in her  throat, pointing to her epiglottis area, and reports she must  regurgitate food if she does  not swallow this the small enough.  Chronic  cough but worse in the last week.  Also notes cardiac catheterization 5-  6 years ago without blockage per her report.  Again, no personal history  of coronary disease or history of seizures.  Other review of systems  reviewed and negative.   PHYSICAL EXAMINATION:  VITAL SIGNS:  Temperature of 97.0, blood pressure  174/97, heart rate of 89, respirations 18, saturation 98% on room air by  ER report.  She is at 93-95% on 1.5 liters at my visit.  GENERAL:  She is a heavy-set but appropriately appearing age white  female with her spouse at bedside.  She is in no acute distress, angled  at a 45-degree recline on the ER stretcher.  HEENT:  Unremarkable.  PERRL.  EOMI.  NECK:  Thick but supple.  No appreciable JVD or goiter.  No masses felt.  LUNGS:  Bilateral rhonchi with considerable upper airway noise, also  crackles at the right base with decreased air movement at the left base.  There is no expiratory wheeze or increased work of breathing.  CARDIOVASCULAR:  Regular rate and rhythm.  No rubs or gallops.  ABDOMEN:  Obese, soft, nontender, without palpable masses.  No bruits.  Good bowel sounds.  EXTREMITIES:  No edema or swelling bilaterally in the lower extremities.  NEUROLOGIC:  She is awake, alert, oriented x4.  Cranial nerves II-XII  are symmetrically intact.  She moves all extremities spontaneously with  good motion and strength.   LABORATORY DATA:  Normal CBC with white count of 7.6, hemoglobin 12.6,  and platelets 185.  ISTAT 8 and creatinine unremarkable.   Two-view chest x-ray with patchy left lower lobe infiltrate.  Head CT  without contrast:  No acute changes, atrophy, and small-vessel disease.  EKG shows normal sinus rhythm at 90 beats per minute, septal Qs, and  left atrial enlargement but no acute change.   ASSESSMENT AND PLAN:  1. Recurrent falls.  Question if these are drop attacks.  She has      had 3 in the last 3 days.  No  loss of consciousness with      uncontrolled falling to the ground with exertional activity and no      warning symptoms.  No evidence of incontinence or seizures.  I      question arrhythmia, question hypoxic event, question hemodynamic      instability.  Will admit to telemetry, check 2-D echocardiogram  in      to evaluate left ventricular function, cycle cardiac enzymes, will      check oxygen saturations with and without oxygen at rest and      exertion, check orthostatics.  Head CT is negative.  I doubt      seizure activity given no loss of consciousness or amnesia events,      no jerking reported, so will not pursue an electroencephalogram at      this time.  Again, no acute changes on electrocardiogram.  Further      workup and recommendations to follow based on these results.  2. Probable left lower lobe pneumonia.  Chest x-ray changes as noted      with clinical sputum and recent addition of Levaquin 3 days ago by      primary doctor.  The patient has had no fever, and laboratory has      no leukocytosis.  Will continue her antibiotics, changing to      intravenous Avelox.  Continue steroids for the bronchitic asthma      component of her disease and also check CT chest to rule out      pulmonary embolism given symptoms of #1 and to further evaluate      lung parenchyma.  I wonder if there is an element of reflux and/or      aspiration given her history of choking on solids.  Will continue      proton pump inhibitor (although this medication is not currently in      her home arsenal supply which she brought) and check with ST for a      modified barium swallow to rule out esophageal dysmotility,      stricture, or other problems.  Consider gastroenterology evaluation      as needed and will also continue home oxygen as needed for      pneumonia.  Question element of obstructive sleep apnea.      Outpatient sleep study if not previously done.  3. History of hypertension.   Continue Norvasc and will add as needed      clonidine to uncontrolled nature.  Will avoid angiotensin-      converting enzyme inhibitors due to cough history and reflux.  4. Gastroesophageal reflux disease.  See number 2.  Continue proton      pump inhibitors.  Consider Reglan.  5. History of anxiety/depression.  Continue home psychiatric      medications.  6. Recent urologic procedure.  Continue Enablex and outpatient      followup with Dr. Annabell Howells as needed.      Valerie A. Felicity Coyer, MD  Electronically Signed     VAL/MEDQ  D:  12/25/2006  T:  12/26/2006  Job:  [id number]

## 2010-07-17 NOTE — Assessment & Plan Note (Signed)
[facility]                             PULMONARY OFFICE NOTE   NAME:Page, Deanna PESNELL                    MRN:          [id number]  DATE:01/13/2007                            DOB:          April 19, 1929    SUBJECTIVE:  Deanna Page is a 77 year old woman with a history of  suspected asthma, a chronic cough and reflux disease.  I have followed  her for her chronic cough and episodic mucopurulent bronchitis.  She was  last seen in our office as a work-in by Dr. Charlcie Cradle. Delford Field on  November 17, 2006.  At that time, her exam was significant for wheezing  in conjunction with her cough.  On my previous exam she had not shown  any wheezing.  She also had in-office spirometry that showed evidence  for air flow limitation, versus mixed disease.  She was started on  Symbicort and treated with a prednisone  taper with improvement.  Unfortunately, since that visit she has had other problems.  She has  experienced what she and her husband believe to be TIAs.  She is  scheduled to see neurology tomorrow.  She has also been seen by Dr. Willow Ora at the end of October for dyspnea and suspected pneumonia.  A CT  pulmonary angiogram was performed which ruled out pulmonary embolus and  which is detailed further below.  She was treated with antibiotics on  that occasion.  She now tells me that her breathing is back to normal.  During our exam, she repeats questions frequently and she is a little  confused.   CURRENT MEDICATIONS:  1. Imipramine 150 mg q.h.s.  2. Norvasc 5 mg daily.  3. Singulair 10 mg daily.  4. Premarin 0.625 mg daily.  5. Risperdal 2 mg q.h.s.  6. Symbicort 160/4.5 mg, two puffs twice daily.  7. Aspirin 81 mg daily.  8. Citalopram 20 mg daily.  9. Pantoprazole 40 mg daily.  10.Enablex 15 mg daily.  11.Lorazepam 1 mg three times daily p.r.n.  12.Albuterol two puffs q.4h. p.r.n. shortness of breath.   PHYSICAL EXAMINATION:  GENERAL:  This is an  elderly somewhat confused  woman, in no distress.  She is repeating questions and shows poor short-  term memory, which is new, compared with our previous exams.  NECK:  Supple, without lymphadenopathy or stridor.  HEENT:  Oropharynx clear.  LUNGS:  Clear to auscultation bilaterally.  She has no wheezing on  forced expiration.  HEART:  Regular without murmur.  ABDOMEN:  Obese, soft, nontender, with positive bowel sounds.  EXTREMITIES:  No clubbing, cyanosis or edema.   STUDIES:  A CT pulmonary angiogram was performed on December 25, 2006.  This showed no evidence of a pulmonary embolus or aortic aneurysm.  There were no notable infiltrates.  She did have some dependent  bilateral lower lobe atelectasis.  There is no evidence for pneumonia.   IMPRESSION:  1. Chronic cough, which appears to be well-compensated at this time.  2. Probable asthma, although we have been unable to document this by      spirometry or  by pulmonary function testing to date.  Her inter-      office spirometry performed at her last visit was as consistent      with restrictive disease as air flow limitation.  3. Gastroesophageal reflux disease.  4. Transient ischemic attacks and short-term memory effects.   PLAN:  1. I will continue her Symbicort as ordered.  2. She will use albuterol on an as-needed basis.  3. She will follow up with me in three months, or sooner if she has      any difficulty in the interim.     Leslye Peer, MD  Electronically Signed    RSB/MedQ  DD: 01/13/2007  DT: 01/13/2007  Job #: [id number]   cc:   Lelon Perla, DO

## 2010-07-17 NOTE — Assessment & Plan Note (Signed)
[facility]                             PULMONARY OFFICE NOTE   NAME:Deanna Page, Deanna Page                    MRN:          [id number]  DATE:07/16/2006                            DOB:          August 19, 1929    REASON FOR CONSULTATION:  We were asked by Dr. Loreen Freud to evaluate  Ms. Babiarz for cough and frequent episodes of bronchitis.   BRIEF HISTORY:  Ms. Frederic is a 77 year old woman with a history of  hypertension, reported obstructive sleep apnea, and possible asthma.  She tells me that she has had frequent episodes of bronchitis with  purulent sputum for many years.  She was diagnosed by Dr. Quincy Simmonds  in [place], [place] with probable asthma approximately 10 years  ago.  She states that she has episodes of severe bronchitis at least  once a year since that diagnosis was made.  She has been treated in the  past with both Advair and short-acting bronchodilators.  She tells me  that inhalers do not help.  Approximately 3-1/2 to 4 weeks ago she  developed a dry cough in the setting of body aches and upper respiratory  infection-type symptoms.  Over approximately 1 week this evolved into a  deeper cough with purulent green to yellow sputum.  She was seen by Dr.  Laury Axon, and treated with a prednisone taper, and was started on  Symbicort.  She stopped the Symbicort approximately 1 week ago.  She  tells me that her cough is now resolved.  Notably, her lisinopril was  also changed to Norvasc.  She tells me that she is now at her usual  baseline.  She does have daily dyspnea with exertion, although she is  able to walk indefinitely at her own pace.  She keeps her grandchild,  and this does tire her, but she is able to do the work.  She is able to  walk through a store without stopping.  She has significant allergy  symptoms when she is exposed to pet dander.   REVIEW OF SYSTEMS:  Otherwise, significant for some acid indigestion and  heartburn,  difficulty swallowing, some nasal congestion, anxiety and  depression.   PAST MEDICAL HISTORY:  1. Hypertension.  2. Asthma diagnosed approximately 10 years ago.  She states that she      has had pulmonary function testing, but she is not sure when these      were done, or where they were done.  3. Possible obstructive sleep apnea.  4. Cholecystitis, status post cholecystectomy 8 years ago.  5. Hysterectomy in 1969.  6. Tubal ligation 1968.  7. Appendectomy 1969.   ALLERGIES:  1. PENICILLIN.  2. ERYTHROMYCIN, BOTH CAUSE A RASH.   SOCIAL HISTORY:  Patient is married.  She lives with her husband.  She  is a retired Engineer, manufacturing.  She has also worked in a hospital in the  past, although she denies any known TB exposure.  She currently takes  care of her 59-month-old grandson.  She is a never-smoker, and does not  use alcohol.   FAMILY HISTORY:  Significant  for asthma in her brother, coronary artery  disease in her brother, and cancer of unknown type in her sister.   PHYSICAL EXAMINATION:  GENERAL:  This is a pleasant, somewhat forgetful,  elderly woman who is in no distress on room air.  Her weight is 160 pounds.  Temperature 97.9.  Blood pressure 132/78.  Heart rate 95.  SPO2 95% on room air.  HEENT EXAM:  The oropharynx is clear.  She has some mild posterior  oropharyngeal erythema.  She has no stridor or lymphadenopathy in her neck.  LUNGS:  Without wheezing or crackles.  There is no wheeze on forced  expiration.  HEART:  Has a regular rate and rhythm without murmur.  ABDOMEN:  Soft, non-tender, non-distended.  Positive bowel sounds.  EXTREMITIES:  Have no cyanosis, clubbing or edema.  NEUROLOGIC:  She has grossly nonfocal exam, although she is somewhat  forgetful, as mentioned above.   X-RAY DATA:  A chest x-ray performed the end of April 2008 was within  normal limits without any evidence of infiltrate or effusion.   IMPRESSION:  Cough with frequent episodes of  mucopurulent bronchitis.  She has been given the diagnosis of asthma, but it is not clear to me  that she actually has reactive airways disease.  This may be chronic  cough and upper airway instability due to her allergies, or also due to  the influence of gastroesophageal reflux disease.   EXAMINATION:  1. We will obtain full pulmonary function testing to establish the      presence or absence of airflow limitation.  If she does have      asthma, then she would likely merit maintenance therapy.  2. I agree with discontinuation of her ACE inhibitor.  3. We may need to start empiric therapy for GERD or allergic rhinitis      if her cough continues.  4. I will follow up with Ms. Rode at our next available      appointment to review the results of her pulmonary function      testing, and to plan bronchodilator therapy if it is indicated.     Leslye Peer, MD     RSB/MedQ  DD: 07/16/2006  DT: 07/16/2006  Job #: [id number]   cc:   Lelon Perla, DO

## 2010-07-26 ENCOUNTER — Encounter: Payer: Self-pay | Admitting: Gastroenterology

## 2010-09-20 ENCOUNTER — Other Ambulatory Visit: Payer: Self-pay | Admitting: Family Medicine

## 2010-09-20 NOTE — Telephone Encounter (Signed)
Last OV 01-21-07, last Filled 08-28-09 #[phone]

## 2010-09-21 ENCOUNTER — Encounter: Payer: Self-pay | Admitting: *Deleted

## 2010-09-21 NOTE — Telephone Encounter (Signed)
Rx sent to pharmacy. Letter Mail

## 2010-09-26 ENCOUNTER — Telehealth: Payer: Self-pay | Admitting: *Deleted

## 2010-09-26 NOTE — Telephone Encounter (Addendum)
Deanna Page called to report that he received letter from our office advise Pt that she was due for OV. Deanna Page states that Pt is no longer seeing dr Laury Axon as her PCP. Pt currently has new PCP that is treating and Rx her med for her. Called pharmacy and cancel Rx sent in for Singulair. Pharmacy advise Pt has pick up 1 month of Rx thus far. Pharmacy advise to void all future refill on med and to contact Pt new PCP for new Rx.

## 2010-10-25 ENCOUNTER — Other Ambulatory Visit: Payer: Self-pay | Admitting: Urology

## 2010-10-25 ENCOUNTER — Encounter (HOSPITAL_COMMUNITY): Payer: Medicare Other

## 2010-10-25 LAB — CBC
HCT: 37.9 % (ref 36.0–46.0)
MCHC: 33.2 g/dL (ref 30.0–36.0)
MCV: 93.3 fL (ref 78.0–100.0)
Platelets: 201 10*3/uL (ref 150–400)
RDW: 13.5 % (ref 11.5–15.5)
WBC: 7.2 10*3/uL (ref 4.0–10.5)

## 2010-10-25 LAB — SURGICAL PCR SCREEN
MRSA, PCR: NEGATIVE
Staphylococcus aureus: NEGATIVE

## 2010-10-25 LAB — COMPREHENSIVE METABOLIC PANEL
AST: 22 U/L (ref 0–37)
Albumin: 3.9 g/dL (ref 3.5–5.2)
BUN: 22 mg/dL (ref 6–23)
Chloride: 99 mEq/L (ref 96–112)
Creatinine, Ser: 1.24 mg/dL — ABNORMAL HIGH (ref 0.50–1.10)
Total Bilirubin: 0.2 mg/dL — ABNORMAL LOW (ref 0.3–1.2)
Total Protein: 7.6 g/dL (ref 6.0–8.3)

## 2010-10-26 LAB — URINE CULTURE
Colony Count: 80000
Culture  Setup Time: 201208240043
Special Requests: NEGATIVE

## 2010-11-01 ENCOUNTER — Ambulatory Visit (HOSPITAL_COMMUNITY)
Admission: RE | Admit: 2010-11-01 | Discharge: 2010-11-01 | Disposition: A | Discharge status: Home / Self Care | Payer: Medicare Other | Source: Ambulatory Visit | Attending: Urology | Admitting: Urology

## 2010-11-01 DIAGNOSIS — G20A1 Parkinson's disease without dyskinesia, without mention of fluctuations: Secondary | ICD-10-CM | POA: Insufficient documentation

## 2010-11-01 DIAGNOSIS — Z01812 Encounter for preprocedural laboratory examination: Secondary | ICD-10-CM | POA: Insufficient documentation

## 2010-11-01 DIAGNOSIS — J45909 Unspecified asthma, uncomplicated: Secondary | ICD-10-CM | POA: Insufficient documentation

## 2010-11-01 DIAGNOSIS — N3642 Intrinsic sphincter deficiency (ISD): Secondary | ICD-10-CM | POA: Insufficient documentation

## 2010-11-01 DIAGNOSIS — N393 Stress incontinence (female) (male): Secondary | ICD-10-CM | POA: Insufficient documentation

## 2010-11-01 DIAGNOSIS — I10 Essential (primary) hypertension: Secondary | ICD-10-CM | POA: Insufficient documentation

## 2010-11-01 DIAGNOSIS — G20 Parkinson's disease: Secondary | ICD-10-CM | POA: Insufficient documentation

## 2010-11-07 NOTE — Op Note (Signed)
NAMEADRINE, HAYWORTH             ACCOUNT NO.:  [id number]  MEDICAL RECORD NO.:  [id number]  LOCATION:  DAYL                         FACILITY:  [facility]  PHYSICIAN:  Excell Seltzer. Annabell Howells, M.D.    DATE OF BIRTH:  [birth year]  DATE OF PROCEDURE:  11/01/2010 DATE OF DISCHARGE:  11/01/2010                              OPERATIVE REPORT   PROCEDURE:  SPARC sling.  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  SURGEON:  Excell Seltzer. Annabell Howells, M.D.  ANESTHESIA:  General.  SPECIMEN:  None.  DRAINS:  16-French Foley catheter and vaginal pack.  BLOOD LOSS:  Minimal.  COMPLICATIONS:  The right trocar was through the bladder on the initial pass, but that was recognized and the trocar was repositioned. I will just leave the Foley for a more prolonged period after the procedure.  INDICATIONS:  Ms. Deanna Page is a 77 year old white female with a history of intrinsic sphincter deficiency and incontinence, who has elected to undergo a SPARC sling.  She had a collagen procedure in 2008, it was transiently effective, and has had a prior bladder tack.  She does have a large capacity bladder with a weak, but effective, detrusor contraction and only mild elevated residual urine.  It was felt that a sling would be more appropriate than another bulking agent since it could be removed __________.  FINDINGS OF PROCEDURE:  The patient was taken to the operating room where a general anesthetic was induced.  She was given 400 mg of Cipro IV and fitted with PAS hose.  She was then placed in the lithotomy position.  Her perineum and genitalia were prepped with Betadine solution and she was draped in the usual sterile fashion.  A Foley catheter was inserted and the bladder was drained.  The anterior vaginal wall was then infiltrated with 2% lidocaine with epinephrine, approximately  10 cc was used.  Two small incisions were made approximately 2 cm on the either side of the midline just above the pubis.  The fat was spread with a hemostat in these incisions.  An anterior vaginal wall incision was made over the midurethra with a knife.  Strully scissors were used to elevate the mucosa of the pubourethral fascia on each side.  There was some scarring from the prior surgery and some limited mobility of the bladder neck.  The right SPARC trocar was passed.  Initially, there was some difficulty passing the trocar like old scar and I backed it out and repositioned it more superiorly, so I would not have to torque it around the pubis as much and then advanced it into the vaginal incision under finger guidance.  Because of the difficulty with the initial passage, I elected to do cystoscopy at this point.  This 22-French cystoscope with 70-degree lens was passed.  Inspection revealed a puncture wound on the lateral wall of the bladder and at the bladder neck consistent with trocar passage; however, with the repositioning of the trocar, it was no longer through the bladder and did not appear to be dangerously submucosal, but more associated with thicker tissue between the mucosa and the trocar.  No urethral injury was noted.  At this point, the left trocar was placed with great care once again from the more superior position to avoid torquing with possible passage through the bladder wall. Cystoscopy was performed after the passage of this trocar.  No evidence of bladder wall injury was noted.  The right side was inspected once again and the trocar was not visualized.  The [facility] mesh was then snapped to the trocars and drawn into an initial position.  Restaging cystoscopy once again revealed no evidence of bladder wall or urethral injury.  At this point, the trocars were cut from the mesh and the sleeves were removed using a small right-angle clamp beneath the mesh to  ensure proper tensioning.  Because of her somewhat weak bladder, I did not want to make it too tight, so proper looseness of the sling at the midurethral level was ensured.  The Foley catheter had been reinserted before final tensioning.  At this point, the anterior vaginal wall was closed with a running locked 2-0 Vicryl suture.  The abdominal ends of the mesh were trimmed, allowed to draw back into subcutaneous tissues.  The abdominal incisions were cleansed, dried, and then closed with Dermabond.  A 2-inch Iodoform vaginal pack was placed and the catheter was placed to straight drainage.  Dressing was applied to the wound after removal of the drapes.  Patient was taken down from the lithotomy position.  Her anesthetic was reversed.  She was moved to recovery room in stable condition.  The vaginal pack will be left for an hour.  She will be sent home with the Foley through the weekend because of the bladder perforation, but that should not be problematic since it was identified and the trocar was repositioned with appropriate spacing from the bladder wall.     Excell Seltzer. Annabell Howells, M.D.     JJW/MEDQ  D:  11/01/2010  T:  11/01/2010  Job:  [id number]  cc:   Donzetta Sprung, MD Fax: [phone]  Electronically Signed by Bjorn Pippin M.D. on 11/07/2010 01:24:56 PM

## 2010-12-12 LAB — CBC
HCT: 37.2
MCHC: 33.8
MCV: 94.4
Platelets: 177
Platelets: 185
RBC: 4.01
RDW: 13.5
WBC: 7.6
WBC: 8.5

## 2010-12-12 LAB — DIFFERENTIAL
Basophils Absolute: 0
Basophils Relative: 1
Eosinophils Absolute: 0.2
Eosinophils Relative: 3
Lymphs Abs: 1.6
Neutrophils Relative %: 66

## 2010-12-12 LAB — I-STAT 8, (EC8 V) (CONVERTED LAB)
Acid-Base Excess: 2
Glucose, Bld: 92
Potassium: 3.6
TCO2: 30
pCO2, Ven: 52.2 — ABNORMAL HIGH
pH, Ven: 7.349 — ABNORMAL HIGH

## 2010-12-12 LAB — CK TOTAL AND CKMB (NOT AT ARMC)
CK, MB: 2.1
CK, MB: 2.3
CK, MB: 2.5
Relative Index: 0.9
Relative Index: 1
Relative Index: 1.2
Total CK: 203 — ABNORMAL HIGH
Total CK: 224 — ABNORMAL HIGH
Total CK: 247 — ABNORMAL HIGH

## 2010-12-12 LAB — BASIC METABOLIC PANEL
BUN: 12
Creatinine, Ser: 1.14
GFR calc Af Amer: 56 — ABNORMAL LOW
GFR calc non Af Amer: 46 — ABNORMAL LOW
Potassium: 4.1

## 2010-12-12 LAB — POCT I-STAT CREATININE
Creatinine, Ser: 1.3 — ABNORMAL HIGH
Operator id: 198171

## 2010-12-12 LAB — TROPONIN I: Troponin I: 0.02

## 2011-05-13 ENCOUNTER — Other Ambulatory Visit: Payer: Self-pay | Admitting: Family Medicine

## 2011-05-13 ENCOUNTER — Ambulatory Visit: Payer: Self-pay

## 2011-05-13 ENCOUNTER — Ambulatory Visit
Admission: RE | Admit: 2011-05-13 | Discharge: 2011-05-13 | Disposition: A | Discharge status: Home / Self Care | Payer: No Typology Code available for payment source | Source: Ambulatory Visit | Attending: Family Medicine | Admitting: Family Medicine

## 2011-05-13 DIAGNOSIS — R52 Pain, unspecified: Secondary | ICD-10-CM

## 2011-05-15 ENCOUNTER — Other Ambulatory Visit: Payer: Self-pay | Admitting: Family Medicine

## 2011-05-15 ENCOUNTER — Ambulatory Visit
Admission: RE | Admit: 2011-05-15 | Discharge: 2011-05-15 | Disposition: A | Discharge status: Home / Self Care | Payer: No Typology Code available for payment source | Source: Ambulatory Visit | Attending: Family Medicine | Admitting: Family Medicine

## 2011-05-15 DIAGNOSIS — M25569 Pain in unspecified knee: Secondary | ICD-10-CM

## 2011-11-18 ENCOUNTER — Other Ambulatory Visit (HOSPITAL_COMMUNITY): Payer: Self-pay | Admitting: Family Medicine

## 2011-11-18 DIAGNOSIS — R1310 Dysphagia, unspecified: Secondary | ICD-10-CM

## 2011-11-20 ENCOUNTER — Other Ambulatory Visit (HOSPITAL_COMMUNITY): Payer: Self-pay

## 2011-11-20 ENCOUNTER — Ambulatory Visit (HOSPITAL_COMMUNITY): Admission: RE | Admit: 2011-11-20 | Payer: No Typology Code available for payment source | Source: Ambulatory Visit

## 2011-11-28 ENCOUNTER — Ambulatory Visit (HOSPITAL_COMMUNITY)
Admission: RE | Admit: 2011-11-28 | Discharge: 2011-11-28 | Disposition: A | Discharge status: Home / Self Care | Payer: Medicare (Managed Care) | Source: Ambulatory Visit | Attending: Family Medicine | Admitting: Family Medicine

## 2011-11-28 DIAGNOSIS — R1310 Dysphagia, unspecified: Secondary | ICD-10-CM

## 2011-11-28 DIAGNOSIS — G20 Parkinson's disease: Secondary | ICD-10-CM | POA: Insufficient documentation

## 2011-11-28 DIAGNOSIS — G20A1 Parkinson's disease without dyskinesia, without mention of fluctuations: Secondary | ICD-10-CM | POA: Insufficient documentation

## 2011-11-28 DIAGNOSIS — R079 Chest pain, unspecified: Secondary | ICD-10-CM | POA: Insufficient documentation

## 2013-01-20 ENCOUNTER — Telehealth: Payer: Self-pay | Admitting: *Deleted

## 2013-01-20 NOTE — Telephone Encounter (Signed)
Patient last saw Dr. Vickey Huger on 01-16-13, husband is asking for a one time visit with Dr. Hosie Poisson.  He spoke to him at [facility] when he did the presentation.  Please advise.

## 2013-01-21 NOTE — Telephone Encounter (Signed)
I didn't realize that she was Deanna Page's patient. I am happy to see the patient once if [name] is ok with it.

## 2013-01-26 ENCOUNTER — Telehealth: Payer: Self-pay | Admitting: Neurology

## 2013-01-27 NOTE — Telephone Encounter (Signed)
See encounter from 01/20/13

## 2013-01-27 NOTE — Telephone Encounter (Signed)
Per Dr Dutch Quint to see Dr Hosie Poisson, scheduled patient for upcoming appt./confirmed with Sonya at [facility] per husband's request

## 2013-01-27 NOTE — Telephone Encounter (Signed)
done

## 2013-02-24 ENCOUNTER — Encounter: Payer: Self-pay | Admitting: Neurology

## 2013-03-01 ENCOUNTER — Ambulatory Visit (INDEPENDENT_AMBULATORY_CARE_PROVIDER_SITE_OTHER): Payer: PRIVATE HEALTH INSURANCE | Admitting: Neurology

## 2013-03-01 ENCOUNTER — Encounter: Payer: Self-pay | Admitting: Neurology

## 2013-03-01 ENCOUNTER — Encounter (INDEPENDENT_AMBULATORY_CARE_PROVIDER_SITE_OTHER): Payer: Self-pay

## 2013-03-01 VITALS — BP 99/58 | HR 56 | Ht 63.0 in | Wt 152.0 lb

## 2013-03-01 DIAGNOSIS — G20 Parkinson's disease: Secondary | ICD-10-CM

## 2013-03-01 MED ORDER — CARBIDOPA-LEVODOPA 25-100 MG PO TABS: 0.5000 | ORAL_TABLET | Freq: Three times a day (TID) | ORAL | Status: DC

## 2013-03-01 NOTE — Patient Instructions (Signed)
Overall you are doing fairly well but I do want to suggest a few things today:   Remember to drink plenty of fluid, eat healthy meals and do not skip any meals. Try to eat protein with a every meal and eat a healthy snack such as fruit or nuts in between meals. Try to keep a regular sleep-wake schedule and try to exercise daily, particularly in the form of walking, 20-30 minutes a day, if you can.   As far as your medications are concerned, I would like to suggest the following: 1)Start Sinemet 25-100 IR, take 1/2 tablet 3 times a day at 8am, noon and 4pm.  2)Continue Requip at current dose and schedule  We will place a referral for physical therapy, they will call you to schedule this  I would like to see you back in 3 to 4 months, sooner if we need to. Please call us with any interim questions, concerns, problems, updates or refill requests.   Please also call us for any test results so we can go over those with you on the phone.  My clinical assistant and will answer any of your questions and relay your messages to me and also relay most of my messages to you.   Our phone number is [phone]. We also have an after hours call service for urgent matters and there is a physician on-call for urgent questions. For any emergencies you know to call 911 or go to the nearest emergency room

## 2013-03-01 NOTE — Progress Notes (Signed)
[facility]   Provider:  Dr Hosie Poisson Referring Provider: Jethro Bastos, MD Primary Care Physician:  Thane Edu, MD  CC:  PD  HPI:  Deanna Page is a 79 y.o. female here for a PD consultation. Is followed by Dr Vickey Huger for PD with last visit in 2012.   Diagnosed with PD around 4 to 5 years ago. Initially noted rest tremor and difficulty walking. Predominantly a rest tremor but has mild action/postural component. Notes muscle stiffness. Notes some generalized bradykinesia, some shuffling of gait. No falls recently. Has some RBD. Has some constipation. No memory troubles, no hallucinations. No difficulty with sense of smell. Currently taking Requip 2mg  3x a day, has been on this regimen since diagnosis. Also taking Artane 2mg  daily. Tried Sinemet in the past, unsure if it helped but noted it caused a lot of problems.   Current main concerns with PD are tremors, bradykinesia and gait difficulty.   Has family history of PD.   Concerns/Questions:Review of Systems: Out of a complete 14 system review, the patient complains of only the following symptoms, and all other reviewed systems are negative. Positive tremor gait instability fatigue  History   Social History  . Marital Status: Married    Spouse Name: Molly Maduro    Number of Children: 3  . Years of Education: 12   Occupational History  . Retired    Social History Main Topics  . Smoking status: Never Smoker   . Smokeless tobacco: Never Used  . Alcohol Use: No  . Drug Use: No  . Sexual Activity: Not on file   Other Topics Concern  . Not on file   Social History Narrative   Patient is married Molly Maduro) and lives with her husband.   Patient has three children.   Patient is retired.   Patient has an high school education.   Patient does drink caffeine beverages.   Patient is left handed.             Family History  Problem Relation Age of Onset  . Lung cancer Father   . ALS Sister    . Liver disease Sister   . Colon cancer Neg Hx   . Stroke Mother   . Parkinson's disease Mother   . Parkinson's disease Maternal Grandmother   . Parkinson's disease Maternal Aunt   . Emphysema Sister   . Emphysema Brother     Past Medical History  Diagnosis Date  . Allergic rhinitis   . Asthma   . Depression   . Hypertension   . Pneumonia   . Urinary incontinence   . Hiatal hernia   . GERD (gastroesophageal reflux disease)   . TIA (transient ischemic attack)   . Esophageal stricture 2005  . Diverticulosis   . Hepatitis A   . Hypothyroidism   . Parkinson disease   . Ischemic colitis, enteritis, or enterocolitis 2010  . OSA (obstructive sleep apnea)     Past Surgical History  Procedure Laterality Date  . Abdominal hysterectomy  1969  . Cholecystectomy  2000  . Tubal ligation  1968  . Appendectomy  1969  . Intraocular prostheses insertion      Left eye at [facility]  . Breast lumpectomy  1995  . Cataract extraction    . Bladder surgery      Current Outpatient Prescriptions  Medication Sig Dispense Refill  . albuterol (PROVENTIL) (2.5 MG/3ML) 0.083% nebulizer solution 2.5 mg. 1 via nebulizer four times a day as needed       .  aspirin 81 MG tablet Take 81 mg by mouth daily.        Marland Kitchen atenolol (TENORMIN) 25 MG tablet Take 25 mg by mouth daily.        . citalopram (CELEXA) 10 MG tablet Take 10 mg by mouth at bedtime.        Marland Kitchen esomeprazole (NEXIUM) 40 MG capsule Take 40 mg by mouth daily before breakfast.        . imipramine (TOFRANIL) 50 MG tablet Take 50 mg by mouth at bedtime.        Marland Kitchen perphenazine 4 MG tablet 4 mg. One tablet by mouth once a day       . rOPINIRole (REQUIP) 2 MG tablet Take 2 mg by mouth 3 (three) times daily.        Marland Kitchen SINGULAIR 10 MG tablet TAKE ONE TABLET BY MOUTH EVERY DAY AS NEEDED  90 each  3  . trihexyphenidyl (ARTANE) 2 MG tablet Take 2 mg by mouth daily.        Marland Kitchen trimethoprim (TRIMPEX) 100 MG tablet Take 100 mg by mouth every evening.         No  current facility-administered medications for this visit.    Allergies as of 03/01/2013 - Review Complete 03/01/2013  Allergen Reaction Noted  . Erythromycin  09/02/2006  . Penicillins  09/02/2006    Vitals: BP 99/58  Pulse 56  Ht 5\' 3"  (1.6 m)  Wt 152 lb (68.947 kg)  BMI 26.93 kg/m2 Last Weight:  Wt Readings from Last 1 Encounters:  03/01/13 152 lb (68.947 kg)   Last Height:   Ht Readings from Last 1 Encounters:  03/01/13 5\' 3"  (1.6 m)     Physical exam: Exam: Gen: NAD, conversant Eyes: anicteric sclerae, moist conjunctivae HENT: Atraumatic, oropharynx clear Lungs: CTA, no wheezing, rales, rhonic                          CV: RRR, no MRG Abdomen: Soft, non-tender;  Extremities: No peripheral edema  Skin: Normal temperature, no rash,  Psych: Appropriate affect, pleasant  Neuro: MS: AA&Ox3, appropriately interactive, normal affect   Speech: fluent w/o paraphasic error  Memory: good recent and remote recall  + Glabellar and applause sign  CN: PERRL, EOMI no nystagmus, no ptosis, sensation intact to LT V1-V3 bilat, face symmetric, no weakness, hearing grossly intact, palate elevates symmetrically, shoulder shrug 5/5 bilat,  tongue protrudes midline, no fasiculations noted.  Motor: increased tone/cogwheeling rigidity RUE>LUE Strength: 5/5  In all extremities  Coord: Severe bradykinesia noted with finger tapping and hand opening bilateral hands, right greater than left, bradykinesia noted in bilateral lower extremities with foot tapping left greater than right. Regurgitant tremor with hands extended, mild intention tremor.   Reflexes: symmetrical, bilat downgoing toes  Sens: LT intact in all extremities  Gait: Stands from chair without pushing off, minimal to no armswing bilaterally, re\re emergent tremor both hands, mildly stooped, marked shuffling   Assessment:  After physical and neurologic examination, review of laboratory studies, imaging, neurophysiology  testing and pre-existing records, assessment will be reviewed on the problem list.  Plan:  Treatment plan and additional workup will be reviewed under Problem List.  1)PD 2)Gait instability  79 year old woman with history of Parkinson's disease presenting for initial evaluation. She is currently taking Requip 2 mg 3 times a day. Overall she appears undermedicated on exam. They do note that she has tried Sinemet in the past, unsure what  dose, states she was unable to tolerate, unsure what her adverse effects were. Discussed different therapeutic options with them. At this time will try patient on low-dose Sinemet 25 100 immediate release, one half tablet 3 times a day at 8 AM, noon and 4 PM. If tolerating this will increase slowly. If unable to tolerate would consider increasing Requip, or switch to alternative dopamine agonists, such as Neupro patch. Will check MOCA at next visit. Referral placed for BIG therapy. Follow up in 3 to 4 months or as needed.   Elspeth Cho, DO  [facility] [address] [place], [place] [address]  Phone [phone] Fax [phone]

## 2013-03-30 ENCOUNTER — Telehealth: Payer: Self-pay | Admitting: Neurology

## 2013-03-30 NOTE — Telephone Encounter (Signed)
Called Sonya at [facility], who is working with the patient, called to reschedule the patient on 05/31/13 at 1:30 pm. Lamar LaundrySonya stated that she would contact the patient with this date and time. I advised sonya that if the patient has any other problems, questions or concerns to call the office. Sonya verbalized understanding.

## 2013-03-31 ENCOUNTER — Ambulatory Visit: Payer: PRIVATE HEALTH INSURANCE | Admitting: Physical Therapy

## 2013-05-31 ENCOUNTER — Encounter: Payer: Self-pay | Admitting: Neurology

## 2013-05-31 ENCOUNTER — Ambulatory Visit (INDEPENDENT_AMBULATORY_CARE_PROVIDER_SITE_OTHER): Payer: PRIVATE HEALTH INSURANCE | Admitting: Neurology

## 2013-05-31 ENCOUNTER — Encounter (INDEPENDENT_AMBULATORY_CARE_PROVIDER_SITE_OTHER): Payer: Self-pay

## 2013-05-31 ENCOUNTER — Ambulatory Visit: Payer: PRIVATE HEALTH INSURANCE | Admitting: Neurology

## 2013-05-31 VITALS — BP 128/76 | HR 55

## 2013-05-31 DIAGNOSIS — R2681 Unsteadiness on feet: Secondary | ICD-10-CM

## 2013-05-31 DIAGNOSIS — G20 Parkinson's disease: Secondary | ICD-10-CM

## 2013-05-31 DIAGNOSIS — R269 Unspecified abnormalities of gait and mobility: Secondary | ICD-10-CM

## 2013-05-31 MED ORDER — ROTIGOTINE 6 MG/24HR TD PT24: 1.0000 | MEDICATED_PATCH | Freq: Every day | TRANSDERMAL | Status: DC

## 2013-05-31 NOTE — Progress Notes (Signed)
Deanna Page   Provider:  Dr Hosie Poisson Referring Provider: Jethro Bastos, MD Primary Care Physician:  Thane Edu, MD  CC:  PD  HPI:  Deanna Page is a 80 y.o. female here for a PD follow up. Was followed by Dr Vickey Huger in the past. Last visit was 03/01/2013 at which time she was started on low dose Sinemet 25-100 IR 1/2 tablet TID. States that the tremor fluctuates, has some good days and bad days. Has been tolerating the Sinemet well, was increased to 1 whole tablet TID this past week. Currently taking it at 10-11am, 4pm and 9pm. Notes having some difficulty walking, denies any recent falls. Denies any memory difficulties, no hallucinations.   Overall notes poor compliance with her medications. Will often miss a Requip or Sinemet on a regular basis.    Last visit 02/2013: Diagnosed with PD around 4 to 5 years ago. Initially noted rest tremor and difficulty walking. Predominantly a rest tremor but has mild action/postural component. Notes muscle stiffness. Notes some generalized bradykinesia, some shuffling of gait. No falls recently. Has some RBD. Has some constipation. No memory troubles, no hallucinations. No difficulty with sense of smell. Currently taking Requip 2mg  3x a day, has been on this regimen since diagnosis. Also taking Artane 2mg  daily. Tried Sinemet in the past, unsure if it helped but noted it caused a lot of problems.   Current main concerns with PD are tremors, bradykinesia and gait difficulty.   Has family history of PD.   Concerns/Questions:Review of Systems: Out of a complete 14 system review, the patient complains of only the following symptoms, and all other reviewed systems are negative. Positive tremor gait instability fatigue  History   Social History  . Marital Status: Married    Spouse Name: Molly Maduro    Number of Children: 3  . Years of Education: 12   Occupational History  . Retired    Social History Main Topics   . Smoking status: Never Smoker   . Smokeless tobacco: Never Used  . Alcohol Use: No  . Drug Use: No  . Sexual Activity: Not on file   Other Topics Concern  . Not on file   Social History Narrative   Patient is married Molly Maduro) and lives with her husband.   Patient has three children.   Patient is retired.   Patient has an high school education.   Patient does drink caffeine beverages.   Patient is left handed.             Family History  Problem Relation Age of Onset  . Lung cancer Father   . ALS Sister   . Liver disease Sister   . Colon cancer Neg Hx   . Stroke Mother   . Parkinson's disease Mother   . Parkinson's disease Maternal Grandmother   . Parkinson's disease Maternal Aunt   . Emphysema Sister   . Emphysema Brother     Past Medical History  Diagnosis Date  . Allergic rhinitis   . Asthma   . Depression   . Hypertension   . Pneumonia   . Urinary incontinence   . Hiatal hernia   . GERD (gastroesophageal reflux disease)   . TIA (transient ischemic attack)   . Esophageal stricture 2005  . Diverticulosis   . Hepatitis A   . Hypothyroidism   . Parkinson disease   . Ischemic colitis, enteritis, or enterocolitis 2010  . OSA (obstructive sleep apnea)  Past Surgical History  Procedure Laterality Date  . Abdominal hysterectomy  1969  . Cholecystectomy  2000  . Tubal ligation  1968  . Appendectomy  1969  . Intraocular prostheses insertion      [facility] eye at [facility]  . Breast lumpectomy  1995  . Cataract extraction    . Bladder surgery      Current Outpatient Prescriptions  Medication Sig Dispense Refill  . albuterol (PROVENTIL) (2.5 MG/3ML) 0.083% nebulizer solution 2.5 mg. 1 via nebulizer four times a day as needed       . aspirin 81 MG tablet Take 81 mg by mouth daily.        Marland Kitchen. atenolol (TENORMIN) 25 MG tablet Take 25 mg by mouth daily.        . carbidopa-levodopa (SINEMET IR) 25-100 MG per tablet Take 0.5 tablets by mouth 3 (three) times daily.   60 tablet  2  . citalopram (CELEXA) 10 MG tablet Take 10 mg by mouth at bedtime.        Marland Kitchen. esomeprazole (NEXIUM) 40 MG capsule Take 40 mg by mouth daily before breakfast.        . imipramine (TOFRANIL) 50 MG tablet Take 50 mg by mouth at bedtime.        Marland Kitchen. perphenazine 4 MG tablet 4 mg. One tablet by mouth once a day       . SINGULAIR 10 MG tablet TAKE ONE TABLET BY MOUTH EVERY DAY AS NEEDED  90 each  3  . trihexyphenidyl (ARTANE) 2 MG tablet Take 2 mg by mouth daily.        Marland Kitchen. trimethoprim (TRIMPEX) 100 MG tablet Take 100 mg by mouth every evening.        . rotigotine (NEUPRO) 6 MG/24HR Place 1 patch onto the skin daily.  30 patch  12   No current facility-administered medications for this visit.    Allergies as of 05/31/2013 - Review Complete 05/31/2013  Allergen Reaction Noted  . Erythromycin  09/02/2006  . Penicillins  09/02/2006    Vitals: BP 128/76  Pulse 55 Last Weight:  Wt Readings from Last 1 Encounters:  03/01/13 152 lb (68.947 kg)   Last Height:   Ht Readings from Last 1 Encounters:  03/01/13 5\' 3"  (1.6 m)     Physical exam: Exam: Gen: NAD, conversant Eyes: anicteric sclerae, moist conjunctivae HENT: Atraumatic, oropharynx clear Lungs: CTA, no wheezing, rales, rhonic                          CV: RRR, no MRG Abdomen: Soft, non-tender;  Extremities: No peripheral edema  Skin: Normal temperature, no rash,  Psych: Appropriate affect, pleasant  Neuro: MS: AA&Ox3, appropriately interactive, normal affect   Speech: fluent w/o paraphasic error  Memory: good recent and remote recall  + Glabellar and applause sign  CN: PERRL, EOMI no nystagmus, no ptosis, sensation intact to LT V1-V3 bilat, face symmetric, no weakness, hearing grossly intact, palate elevates symmetrically, shoulder shrug 5/5 bilat,  tongue protrudes midline, no fasiculations noted.  Motor: increased tone/cogwheeling rigidity RUE>LUE Strength: 5/5  In all extremities  Coord: Moderate to  severe bilateral rest tremor, Severe bradykinesia noted with finger tapping and hand opening bilateral hands, right greater than left, bradykinesia noted in bilateral lower extremities with foot tapping left greater than right. Re-emergent tremor with hands extended, mild intention tremor.   Reflexes: symmetrical, bilat downgoing toes  Sens: LT intact in all extremities  Gait: Stands from chair without pushing off, minimal to no armswing bilaterally, re\re emergent tremor both hands, mildly stooped, marked shuffling   Assessment:  After physical and neurologic examination, review of laboratory studies, imaging, neurophysiology testing and pre-existing records, assessment will be reviewed on the problem list.  Plan:  Treatment plan and additional workup will be reviewed under Problem List.  1)PD 2)Gait instability  80 year old woman with history of Parkinson's disease presenting for follow up evaluation. She is currently taking Requip 2 mg 3 times a day and Sinemet 25-100 TID. Overall she still appears undermedicated with some progression of symptoms. Based on history it appears there is poor overall compliance with current medication regimen with frequent missed doses. Counseled them on importance of not missing a dose and taking on a set schedule. Will take Sinemet at 11am, 3pm and 7pm. Will discontinue Requip and start Neuro 6mg  patch with hope of improved compliance in once a day dosing. Marland Kitchen Will check MOCA at next visit. Follow up in 3 to 4 months or as needed.   Elspeth Cho, DO  [facility] [address] [place], [place] [address]  Phone [phone] Fax [phone]

## 2013-05-31 NOTE — Patient Instructions (Signed)
Overall you are doing fairly well but I do want to suggest a few things today:   Remember to drink plenty of fluid, eat healthy meals and do not skip any meals. Try to eat protein with a every meal and eat a healthy snack such as fruit or nuts in between meals. Try to keep a regular sleep-wake schedule and try to exercise daily, particularly in the form of walking, 20-30 minutes a day, if you can.   As far as your medications are concerned, I would like to suggest the following: 1)Please continue to take your Sinemet 25-100 on a set schedule at 11am, 3pm and 7pm. It is important to not miss a dose of this medication 2)Please discontinue the Requip. In its place you will start Neupro 6mg . It is a once a day patch.   I would like to see you back in 4 months, sooner if we need to. Please call us with any interim questions, concerns, problems, updates or refill requests.   Please also call us for any test results so we can go over those with you on the phone.  My clinical assistant and will answer any of your questions and relay your messages to me and also relay most of my messages to you.   Our phone number is [phone]. We also have an after hours call service for urgent matters and there is a physician on-call for urgent questions. For any emergencies you know to call 911 or go to the nearest emergency room

## 2013-10-04 ENCOUNTER — Ambulatory Visit (INDEPENDENT_AMBULATORY_CARE_PROVIDER_SITE_OTHER): Payer: PRIVATE HEALTH INSURANCE | Admitting: Neurology

## 2013-10-04 ENCOUNTER — Encounter: Payer: Self-pay | Admitting: Neurology

## 2013-10-04 VITALS — BP 117/61 | HR 51 | Ht 63.0 in | Wt 141.0 lb

## 2013-10-04 DIAGNOSIS — G20A1 Parkinson's disease without dyskinesia, without mention of fluctuations: Secondary | ICD-10-CM | POA: Diagnosis not present

## 2013-10-04 DIAGNOSIS — G20 Parkinson's disease: Secondary | ICD-10-CM | POA: Diagnosis not present

## 2013-10-04 NOTE — Patient Instructions (Signed)
Overall you are doing fairly well but I do want to suggest a few things today:   Remember to drink plenty of fluid, eat healthy meals and do not skip any meals. Try to eat protein with a every meal and eat a healthy snack such as fruit or nuts in between meals. Try to keep a regular sleep-wake schedule and try to exercise daily, particularly in the form of walking, 20-30 minutes a day, if you can.   As far as your medications are concerned, I would like to suggest the following: 1)Please increase your Sinemet 25-100 to 1.5 tablets four times a day to be taken at 9:30am, 1:30pm, 5:30pm and 9:30pm 2)Please continue your requip three times a day. Please call us and update your dose and schedule  Follow up with Dr Porfirio Mylararmen Dohmeier in 4 months. Please call us with any interim questions, concerns, problems, updates or refill requests.   My clinical assistant and will answer any of your questions and relay your messages to me and also relay most of my messages to you.   Our phone number is [phone]. We also have an after hours call service for urgent matters and there is a physician on-call for urgent questions. For any emergencies you know to call 911 or go to the nearest emergency room

## 2013-10-04 NOTE — Progress Notes (Signed)
[facility]   Provider:  Dr Hosie Poisson Referring Provider: Jethro Bastos, MD Primary Care Physician:  Thane Edu, MD  CC:  PD  HPI:  Deanna Page is a 80 y.o. female here for a PD follow up. Was followed by Dr Vickey Huger in the past. Last visit was 05/2013 at which time she was started on Neupro patch. They note they stopped this a few days after she started and went back to Requip. Unclear why it was stopped, they are also unsure what dose of Requip she is currently on.  Currently taking Sinemet 25-100 TID, they note she takes it at "various times". Takes the 1st dose around 11, takes next dose at 4pm, takes the last dose at bedtime. Notes she forgets to take it a few times a week. Often takes it on a different schedule. Notes improvement when taking this medication, feels more relaxed. Has decreased tremor and muscle shaking. Main concern today is increased shaking and severe anxiety.    Initial visit 02/2013: Diagnosed with PD around 4 to 5 years ago. Initially noted rest tremor and difficulty walking. Predominantly a rest tremor but has mild action/postural component. Notes muscle stiffness. Notes some generalized bradykinesia, some shuffling of gait. No falls recently. Has some RBD. Has some constipation. No memory troubles, no hallucinations. No difficulty with sense of smell. Currently taking Requip 2mg  3x a day, has been on this regimen since diagnosis. Also taking Artane 2mg  daily. Tried Sinemet in the past, unsure if it helped but noted it caused a lot of problems.   Current main concerns with PD are tremors, bradykinesia and gait difficulty.   Has family history of PD.   Concerns/Questions:Review of Systems: Out of a complete 14 system review, the patient complains of only the following symptoms, and all other reviewed systems are negative. Positive tremor gait instability fatigue  History   Social History  . Marital Status: Married   Spouse Name: Molly Maduro    Number of Children: 3  . Years of Education: 12   Occupational History  . Retired    Social History Main Topics  . Smoking status: Never Smoker   . Smokeless tobacco: Never Used  . Alcohol Use: No  . Drug Use: No  . Sexual Activity: Not on file   Other Topics Concern  . Not on file   Social History Narrative   Patient is married Molly Maduro) and lives with her husband.   Patient has three children.   Patient is retired.   Patient has an high school education.   Patient does drink caffeine beverages.   Patient is left handed.             Family History  Problem Relation Age of Onset  . Lung cancer Father   . ALS Sister   . Liver disease Sister   . Colon cancer Neg Hx   . Stroke Mother   . Parkinson's disease Mother   . Parkinson's disease Maternal Grandmother   . Parkinson's disease Maternal Aunt   . Emphysema Sister   . Emphysema Brother     Past Medical History  Diagnosis Date  . Allergic rhinitis   . Asthma   . Depression   . Hypertension   . Pneumonia   . Urinary incontinence   . Hiatal hernia   . GERD (gastroesophageal reflux disease)   . TIA (transient ischemic attack)   . Esophageal stricture 2005  . Diverticulosis   . Hepatitis A   .  Hypothyroidism   . Parkinson disease   . Ischemic colitis, enteritis, or enterocolitis 2010  . OSA (obstructive sleep apnea)     Past Surgical History  Procedure Laterality Date  . Abdominal hysterectomy  1969  . Cholecystectomy  2000  . Tubal ligation  1968  . Appendectomy  1969  . Intraocular prostheses insertion      [facility]  . Breast lumpectomy  1995  . Cataract extraction    . Bladder surgery      Current Outpatient Prescriptions  Medication Sig Dispense Refill  . albuterol (PROVENTIL) (2.5 MG/3ML) 0.083% nebulizer solution 2.5 mg. 1 via nebulizer four times a day as needed       . aspirin 81 MG tablet Take 81 mg by mouth daily.        Marland Kitchen. atenolol (TENORMIN) 25 MG  tablet Take 25 mg by mouth daily.        . carbidopa-levodopa (SINEMET IR) 25-100 MG per tablet Take 0.5 tablets by mouth 3 (three) times daily.  60 tablet  2  . citalopram (CELEXA) 10 MG tablet Take 10 mg by mouth at bedtime.        Marland Kitchen. esomeprazole (NEXIUM) 40 MG capsule Take 40 mg by mouth daily before breakfast.        . imipramine (TOFRANIL) 50 MG tablet Take 50 mg by mouth at bedtime.        Marland Kitchen. perphenazine 4 MG tablet 4 mg. One tablet by mouth once a day       . rotigotine (NEUPRO) 6 MG/24HR Place 1 patch onto the skin daily.  30 patch  12  . SINGULAIR 10 MG tablet TAKE ONE TABLET BY MOUTH EVERY DAY AS NEEDED  90 each  3  . trihexyphenidyl (ARTANE) 2 MG tablet Take 2 mg by mouth daily.        Marland Kitchen. trimethoprim (TRIMPEX) 100 MG tablet Take 100 mg by mouth every evening.         No current facility-administered medications for this visit.    Allergies as of 10/04/2013 - Review Complete 10/04/2013  Allergen Reaction Noted  . Erythromycin  09/02/2006  . Penicillins  09/02/2006    Vitals: BP 117/61  Pulse 51  Ht 5\' 3"  (1.6 m)  Wt 141 lb (63.957 kg)  BMI 24.98 kg/m2 Last Weight:  Wt Readings from Last 1 Encounters:  10/04/13 141 lb (63.957 kg)   Last Height:   Ht Readings from Last 1 Encounters:  10/04/13 5\' 3"  (1.6 m)     Physical exam: Exam: Gen: NAD, conversant Eyes: anicteric sclerae, moist conjunctivae HENT: Atraumatic, oropharynx clear Lungs: CTA, no wheezing, rales, rhonic                          CV: RRR, no MRG Abdomen: Soft, non-tender;  Extremities: No peripheral edema  Skin: Normal temperature, no rash,  Psych: Appropriate affect, pleasant  Neuro: MS: AA&Ox3, appropriately interactive, normal affect   Speech: fluent w/o paraphasic error  Memory: good recent and remote recall  + Glabellar and applause sign  CN: PERRL, EOMI no nystagmus, no ptosis, sensation intact to LT V1-V3 bilat, face symmetric, no weakness, hearing grossly intact, palate elevates  symmetrically, shoulder shrug 5/5 bilat,  tongue protrudes midline, no fasiculations noted.  Motor: increased tone/cogwheeling rigidity RUE>LUE Strength: 5/5  In all extremities  Coord: Moderate to severe bilateral rest tremor, Severe bradykinesia noted with finger tapping and hand opening bilateral  hands, right greater than left, bradykinesia noted in bilateral lower extremities with foot tapping left greater than right. Re-emergent tremor with hands extended, mild intention tremor.   Reflexes: symmetrical, bilat downgoing toes  Sens: LT intact in all extremities  Gait: Stands from chair without pushing off, minimal to no armswing bilaterally, re\re emergent tremor both hands, mildly stooped, marked shuffling   Assessment:  After physical and neurologic examination, review of laboratory studies, imaging, neurophysiology testing and pre-existing records, assessment will be reviewed on the problem list.  Plan:  Treatment plan and additional workup will be reviewed under Problem List.  1)PD 2)Gait instability  80 year old woman with history of Parkinson's disease presenting for follow up evaluation. She is currently taking Requip 3 times a day (unclear dose) Sinemet 25-100 TID. Overall she still appears undermedicated with some progression of symptoms. Based on history it appears there is poor overall compliance with current medication regimen with frequent missed doses. Again counseled them extensively on importance of not missing a dose and taking on a set schedule. Will increase Sinemet to 1.5 tablets 4x a day at 930am, 130pm, 530pm and 930pm. Continue requip, requested they call our office to update [place] on the current dose. Follow up with Dr Vickey Huger (previous neurologist) or Dr Frances Furbish in 4 months.  Elspeth Cho, DO  [facility] [address] [place], [place] [address]  Phone [phone] Fax [phone]

## 2013-10-05 ENCOUNTER — Telehealth: Payer: Self-pay | Admitting: *Deleted

## 2013-12-02 NOTE — Telephone Encounter (Signed)
ERROR

## 2013-12-23 ENCOUNTER — Ambulatory Visit (INDEPENDENT_AMBULATORY_CARE_PROVIDER_SITE_OTHER): Payer: PRIVATE HEALTH INSURANCE | Admitting: Podiatry

## 2013-12-23 ENCOUNTER — Ambulatory Visit (INDEPENDENT_AMBULATORY_CARE_PROVIDER_SITE_OTHER): Payer: PRIVATE HEALTH INSURANCE

## 2013-12-23 DIAGNOSIS — M79672 Pain in left foot: Secondary | ICD-10-CM

## 2013-12-23 DIAGNOSIS — M2042 Other hammer toe(s) (acquired), left foot: Secondary | ICD-10-CM

## 2013-12-23 DIAGNOSIS — L600 Ingrowing nail: Secondary | ICD-10-CM

## 2013-12-23 NOTE — Progress Notes (Signed)
Subjective:     Patient ID: Deanna Page, female   DOB: 1930/01/26, 80 y.o.   MRN: [id number]  HPI patient presents stating I'm having a lot of pain in my fourth and fifth toes on my left foot and I think it's the nailbeds. Patient does have advanced Parkinson's difficult to understand and does present with caregiver   Review of Systems  All other systems reviewed and are negative.      Objective:   Physical Exam  Nursing note and vitals reviewed. Constitutional: She is oriented to person, place, and time.  Cardiovascular: Intact distal pulses.   Musculoskeletal: Normal range of motion.  Neurological: She is oriented to person, place, and time.  Skin: Skin is warm and dry.   neurovascular status found to be intact with significant digital contracture left over right with no keratotic lesions on the left fourth and fifth toes. No red areas or drainage noted and there is what appears to be incurvated nails of the fourth and fifth that when I pressed she states it that's where her pain is coming from. Patient's digits are well-perfused and she is well oriented x3     Assessment:     Damaged fourth and fifth nails left with probable pain noted    Plan:     H&P and condition discussed with her and caregiver. I've recommended removal of nails and explain the procedure going over the risk of the surgery and the fact it may not improve her condition. Patient wants surgery and today I infiltrated 100 mg Xylocaine Marcaine mixture removed the fourth and fifth nails exposed matrix and applied phenol 3 applications followed by alcohol lavaged and sterile dressing. Instructed on soaks and reappoint

## 2013-12-23 NOTE — Progress Notes (Signed)
   Subjective:    Patient ID: Deanna Page, female    DOB: 31-Oct-1929, 80 y.o.   MRN: [id number]  HPI Comments: "My toes hurt"  Patient c/o sudden, sharp sensations 4th and 5th toes left foot for about 1 year. The toes swell sometimes. No injury. She has been soaking at home, but doesn't help. She says its mainly sore around the toenail. "It might be ingrown"     Review of Systems  Musculoskeletal: Positive for gait problem.  Skin:       Change in nails  Neurological: Positive for tremors and weakness.  Hematological: Bruises/bleeds easily.  Psychiatric/Behavioral: The patient is nervous/anxious.   All other systems reviewed and are negative.      Objective:   Physical Exam        Assessment & Plan:

## 2013-12-23 NOTE — Patient Instructions (Addendum)
ANTIBACTERIAL SOAP INSTRUCTIONS  THE DAY AFTER PROCEDURE  Please follow the instructions your doctor has marked.    Place 3-4 drops of antibacterial liquid soap in a quart of warm tap water.  Submerge foot into water for 20 minutes.  If bandage was applied after your procedure, leave on to allow for easy lift off, then remove and continue with soak for the remaining time.  Next, blot area dry with a soft cloth and cover with a bandage.  Apply other medications as directed by your doctor, such as cortisporin otic solution (eardrops) or neosporin antibiotic ointment   Long Term Care Instructions-Post Nail Surgery  You have had your ingrown toenail and root treated with a chemical.  This chemical causes a burn that will drain and ooze like a blister.  This can drain for 6-8 weeks or longer.  It is important to keep this area clean, covered, and follow the soaking instructions dispensed at the time of your surgery.  This area will eventually dry and form a scab.  Once the scab forms you no longer need to soak or apply a dressing.  If at any time you experience an increase in pain, redness, swelling, or drainage, you should contact the office as soon as possible.

## 2014-02-03 ENCOUNTER — Ambulatory Visit: Payer: PRIVATE HEALTH INSURANCE | Admitting: Neurology

## 2014-02-16 ENCOUNTER — Ambulatory Visit: Payer: PRIVATE HEALTH INSURANCE | Admitting: Podiatry

## 2014-02-17 ENCOUNTER — Ambulatory Visit: Payer: PRIVATE HEALTH INSURANCE | Admitting: Podiatry

## 2014-03-07 ENCOUNTER — Ambulatory Visit (INDEPENDENT_AMBULATORY_CARE_PROVIDER_SITE_OTHER): Payer: PRIVATE HEALTH INSURANCE | Admitting: Podiatry

## 2014-03-07 VITALS — BP 126/63 | HR 59 | Resp 16

## 2014-03-07 DIAGNOSIS — M2042 Other hammer toe(s) (acquired), left foot: Secondary | ICD-10-CM

## 2014-03-09 NOTE — Progress Notes (Signed)
Subjective:     Patient ID: Deanna Page, female   DOB: Jan 16, 1930, 81 y.o.   MRN: [id number]  HPI patient presents stating she is still having some problems with her left fourth and fifth toes after nail removal. States that helped at the end but she still gets pain more proximal and she presents today with caregiver and does have significant history of Parkinson's disease   Review of Systems     Objective:   Physical Exam Neurovascular status unchanged with no other change in health history and noted to have diminished range of motion and diminished muscle strength secondary to Parkinson's with some contracture of the fourth and fifth toes left with mild redness at the proximal phalanx head and well-healed nail sites fourth and fifth    Assessment:     Probable hammertoe deformity with well-healed nail sites fourth and fifth left    Plan:     Review difficulty of condition and the fact that her health history does not make it feasible to consider surgery. Applied pads and advised on wider-type shoes to see if we can reduce the pressure against these toes and reappoint on an as-needed basis if symptoms persist

## 2014-05-22 ENCOUNTER — Emergency Department (HOSPITAL_COMMUNITY): Payer: Medicare (Managed Care)

## 2014-05-22 ENCOUNTER — Encounter (HOSPITAL_COMMUNITY): Payer: Self-pay | Admitting: Emergency Medicine

## 2014-05-22 ENCOUNTER — Emergency Department (HOSPITAL_COMMUNITY)
Admission: EM | Admit: 2014-05-22 | Discharge: 2014-05-22 | Disposition: A | Discharge status: Home / Self Care | Payer: Medicare (Managed Care) | Attending: Emergency Medicine | Admitting: Emergency Medicine

## 2014-05-22 DIAGNOSIS — Z79891 Long term (current) use of opiate analgesic: Secondary | ICD-10-CM | POA: Insufficient documentation

## 2014-05-22 DIAGNOSIS — J45901 Unspecified asthma with (acute) exacerbation: Secondary | ICD-10-CM | POA: Insufficient documentation

## 2014-05-22 DIAGNOSIS — Z7952 Long term (current) use of systemic steroids: Secondary | ICD-10-CM | POA: Insufficient documentation

## 2014-05-22 DIAGNOSIS — Z79899 Other long term (current) drug therapy: Secondary | ICD-10-CM | POA: Diagnosis not present

## 2014-05-22 DIAGNOSIS — Z7982 Long term (current) use of aspirin: Secondary | ICD-10-CM | POA: Diagnosis not present

## 2014-05-22 DIAGNOSIS — Z880 Allergy status to penicillin: Secondary | ICD-10-CM | POA: Diagnosis not present

## 2014-05-22 DIAGNOSIS — R0602 Shortness of breath: Secondary | ICD-10-CM

## 2014-05-22 DIAGNOSIS — Z8673 Personal history of transient ischemic attack (TIA), and cerebral infarction without residual deficits: Secondary | ICD-10-CM | POA: Insufficient documentation

## 2014-05-22 DIAGNOSIS — E039 Hypothyroidism, unspecified: Secondary | ICD-10-CM | POA: Insufficient documentation

## 2014-05-22 DIAGNOSIS — I10 Essential (primary) hypertension: Secondary | ICD-10-CM | POA: Insufficient documentation

## 2014-05-22 DIAGNOSIS — Z8619 Personal history of other infectious and parasitic diseases: Secondary | ICD-10-CM | POA: Insufficient documentation

## 2014-05-22 DIAGNOSIS — K219 Gastro-esophageal reflux disease without esophagitis: Secondary | ICD-10-CM | POA: Insufficient documentation

## 2014-05-22 DIAGNOSIS — R079 Chest pain, unspecified: Secondary | ICD-10-CM | POA: Diagnosis not present

## 2014-05-22 DIAGNOSIS — J40 Bronchitis, not specified as acute or chronic: Secondary | ICD-10-CM

## 2014-05-22 DIAGNOSIS — Z8701 Personal history of pneumonia (recurrent): Secondary | ICD-10-CM | POA: Diagnosis not present

## 2014-05-22 DIAGNOSIS — G20 Parkinson's disease: Secondary | ICD-10-CM | POA: Insufficient documentation

## 2014-05-22 DIAGNOSIS — F329 Major depressive disorder, single episode, unspecified: Secondary | ICD-10-CM | POA: Insufficient documentation

## 2014-05-22 LAB — BASIC METABOLIC PANEL
Anion gap: 3 — ABNORMAL LOW (ref 5–15)
BUN: 14 mg/dL (ref 6–23)
CALCIUM: 8.5 mg/dL (ref 8.4–10.5)
CHLORIDE: 104 mmol/L (ref 96–112)
CO2: 30 mmol/L (ref 19–32)
Creatinine, Ser: 1.17 mg/dL — ABNORMAL HIGH (ref 0.50–1.10)
GFR calc Af Amer: 49 mL/min — ABNORMAL LOW (ref >90)
GFR, EST NON AFRICAN AMERICAN: 42 mL/min — AB (ref >90)
Glucose, Bld: 87 mg/dL (ref 70–99)
Potassium: 3.8 mmol/L (ref 3.5–5.1)
SODIUM: 137 mmol/L (ref 135–145)

## 2014-05-22 LAB — CBC WITH DIFFERENTIAL/PLATELET
BASOS ABS: 0.1 10*3/uL (ref 0.0–0.1)
Basophils Relative: 1 % (ref 0–1)
Eosinophils Absolute: 0.1 10*3/uL (ref 0.0–0.7)
Eosinophils Relative: 2 % (ref 0–5)
HCT: 33.2 % — ABNORMAL LOW (ref 36.0–46.0)
HEMOGLOBIN: 11 g/dL — AB (ref 12.0–15.0)
Lymphocytes Relative: 28 % (ref 12–46)
Lymphs Abs: 2 10*3/uL (ref 0.7–4.0)
MCH: 31.4 pg (ref 26.0–34.0)
MCHC: 33.1 g/dL (ref 30.0–36.0)
MCV: 94.9 fL (ref 78.0–100.0)
MONO ABS: 0.9 10*3/uL (ref 0.1–1.0)
Monocytes Relative: 13 % — ABNORMAL HIGH (ref 3–12)
NEUTROS ABS: 4 10*3/uL (ref 1.7–7.7)
Neutrophils Relative %: 56 % (ref 43–77)
Platelets: 134 10*3/uL — ABNORMAL LOW (ref 150–400)
RBC: 3.5 MIL/uL — AB (ref 3.87–5.11)
RDW: 12.8 % (ref 11.5–15.5)
WBC: 7 10*3/uL (ref 4.0–10.5)

## 2014-05-22 LAB — BRAIN NATRIURETIC PEPTIDE: B Natriuretic Peptide: 488 pg/mL — ABNORMAL HIGH (ref 0.0–100.0)

## 2014-05-22 LAB — I-STAT TROPONIN, ED: Troponin i, poc: 0 ng/mL (ref 0.00–0.08)

## 2014-05-22 MED ORDER — IOHEXOL 350 MG/ML SOLN
75.0000 mL | Freq: Once | INTRAVENOUS | Status: AC | PRN
  Administered 2014-05-22: 75 mL via INTRAVENOUS

## 2014-05-22 MED ORDER — ALBUTEROL SULFATE HFA 108 (90 BASE) MCG/ACT IN AERS: 4.0000 | INHALATION_SPRAY | Freq: Once | RESPIRATORY_TRACT | Status: DC

## 2014-05-22 MED ORDER — CARBIDOPA-LEVODOPA 25-100 MG PO TABS
0.5000 | ORAL_TABLET | Freq: Once | ORAL | Status: AC
  Administered 2014-05-22: 0.5 via ORAL
  Filled 2014-05-22: qty 0.5

## 2014-05-22 NOTE — ED Notes (Signed)
Pt states she does not want to wait for discharge paperwork.  Iv removed, pt assisted to dress, pt escorted to front chair via wheelchair.

## 2014-05-22 NOTE — ED Notes (Signed)
Per EMS was called to patient's home by pt complaining of shortness of breath and chest pain. Patient states history of Shortness of breath.

## 2014-05-22 NOTE — ED Provider Notes (Signed)
CSN: [id number]     Arrival date & time 05/22/14  1837 History   First MD Initiated Contact with Patient 05/22/14 1837     Chief Complaint  Patient presents with  . Shortness of Breath  . Chest Pain     (Consider location/radiation/quality/duration/timing/severity/associated sxs/prior Treatment) Patient is a 81 y.o. female presenting with shortness of breath. The history is provided by the patient and the EMS personnel.  Shortness of Breath Severity:  Moderate Onset quality:  Gradual Duration:  2 weeks Timing:  Constant Progression:  Worsening Chronicity:  New Relieved by:  Nothing Worsened by:  Coughing Ineffective treatments:  None tried Associated symptoms: chest pain (retrosternal, tightness), cough and sore throat   Associated symptoms: no fever, no neck pain, no vomiting and no wheezing   Risk factors: no hx of PE/DVT and no recent surgery     Past Medical History  Diagnosis Date  . Allergic rhinitis   . Asthma   . Depression   . Hypertension   . Pneumonia   . Urinary incontinence   . Hiatal hernia   . GERD (gastroesophageal reflux disease)   . TIA (transient ischemic attack)   . Esophageal stricture 2005  . Diverticulosis   . Hepatitis A   . Hypothyroidism   . Parkinson disease   . Ischemic colitis, enteritis, or enterocolitis 2010  . OSA (obstructive sleep apnea)    Past Surgical History  Procedure Laterality Date  . Abdominal hysterectomy  1969  . Cholecystectomy  2000  . Tubal ligation  1968  . Appendectomy  1969  . Intraocular prostheses insertion      Left eye at [facility]  . Breast lumpectomy  1995  . Cataract extraction    . Bladder surgery     Family History  Problem Relation Age of Onset  . Lung cancer Father   . ALS Sister   . Liver disease Sister   . Colon cancer Neg Hx   . Stroke Mother   . Parkinson's disease Mother   . Parkinson's disease Maternal Grandmother   . Parkinson's disease Maternal Aunt   . Emphysema Sister   . Emphysema  Brother    History  Substance Use Topics  . Smoking status: Never Smoker   . Smokeless tobacco: Never Used  . Alcohol Use: No   OB History    No data available     Review of Systems  Constitutional: Negative for fever, activity change and appetite change.  HENT: Positive for sore throat.   Respiratory: Positive for cough, chest tightness and shortness of breath. Negative for wheezing.   Cardiovascular: Positive for chest pain (retrosternal, tightness).  Gastrointestinal: Negative for nausea, vomiting, diarrhea and constipation.  Musculoskeletal: Negative for neck pain.  All other systems reviewed and are negative.     Allergies  Aricept; Doxycycline; Erythromycin; Penicillins; and Pneumococcal vaccines  Home Medications   Prior to Admission medications   Medication Sig Start Date End Date Taking? Authorizing Provider  aspirin 81 MG tablet Take 81 mg by mouth daily.      Historical Provider, MD  atenolol (TENORMIN) 25 MG tablet Take 25 mg by mouth daily.      Historical Provider, MD  carbidopa-levodopa (SINEMET IR) 25-100 MG per tablet Take 0.5 tablets by mouth 3 (three) times daily. Patient taking differently: Take 1 tablet by mouth 3 (three) times daily.  03/01/13   Ramond Marrow, DO  Cholecalciferol (VITAMIN D PO) Take 1 tablet by mouth daily.  Historical Provider, MD  citalopram (CELEXA) 10 MG tablet Take 10 mg by mouth at bedtime.      Historical Provider, MD  estrogens, conjugated, (PREMARIN) 0.625 MG tablet Take 0.625 mg by mouth daily. Take daily for 21 days then do not take for 7 days.    Historical Provider, MD  levothyroxine (SYNTHROID, LEVOTHROID) 50 MCG tablet Take 50 mcg by mouth daily before breakfast.    Historical Provider, MD  loteprednol (LOTEMAX) 0.5 % ophthalmic suspension Place 1 drop into the right eye 4 (four) times daily.     Historical Provider, MD  memantine (NAMENDA) 10 MG tablet Take 10 mg by mouth daily.    Historical Provider, MD  oxyCODONE  (OXY IR/ROXICODONE) 5 MG immediate release tablet Take 2.5 mg by mouth at bedtime.    Historical Provider, MD  pantoprazole (PROTONIX) 40 MG tablet Take 40 mg by mouth daily.    Historical Provider, MD  perphenazine 4 MG tablet Take 4 mg by mouth daily.     Historical Provider, MD  polyethylene glycol powder (GLYCOLAX/MIRALAX) powder Take 1 Container by mouth once.    Historical Provider, MD  rOPINIRole (REQUIP) 0.5 MG tablet Take 0.5 mg by mouth 2 (two) times daily.     Historical Provider, MD  SINGULAIR 10 MG tablet TAKE ONE TABLET BY MOUTH EVERY DAY AS NEEDED Patient taking differently: TAKE ONE TABLET BY MOUTH at bedtime 09/20/10   Grayling Congress Lowne, DO   BP 127/64 mmHg  Pulse 57  Temp(Src) 98.2 F (36.8 C) (Oral)  Resp 31  SpO2 98% Physical Exam  Constitutional: She is oriented to person, place, and time. She appears well-developed and well-nourished. No distress.  Alert, cooperative, speaks in short sentences due to Parkinson's but no respiratory distress.  HENT:  Head: Normocephalic and atraumatic.  Mouth/Throat: Oropharynx is clear and moist. No oropharyngeal exudate.  Oropharynx without erythema or edema  Eyes: Pupils are equal, round, and reactive to light.  Neck: Normal range of motion. Neck supple.  Cardiovascular: Normal rate, regular rhythm, normal heart sounds and intact distal pulses.  Exam reveals no gallop and no friction rub.   No murmur heard. Pulmonary/Chest: Effort normal and breath sounds normal. No respiratory distress. She has no wheezes. She has no rales.  Abdominal: Soft. She exhibits no distension. There is no tenderness.  Musculoskeletal: Normal range of motion. She exhibits no edema.  Lymphadenopathy:    She has no cervical adenopathy.  Neurological: She is alert and oriented to person, place, and time.  High frequency, low amplitude tremor consistent with known Parkinson's  Skin: Skin is warm and dry. No rash noted. She is not diaphoretic.  Psychiatric: She  has a normal mood and affect. Her behavior is normal. Judgment and thought content normal.  Nursing note and vitals reviewed.   ED Course  Procedures (including critical care time) Labs Review Labs Reviewed  CBC WITH DIFFERENTIAL/PLATELET - Abnormal; Notable for the following:    RBC 3.50 (*)    Hemoglobin 11.0 (*)    HCT 33.2 (*)    Platelets 134 (*)    Monocytes Relative 13 (*)    All other components within normal limits  BASIC METABOLIC PANEL - Abnormal; Notable for the following:    Creatinine, Ser 1.17 (*)    GFR calc non Af Amer 42 (*)    GFR calc Af Amer 49 (*)    Anion gap 3 (*)    All other components within normal limits  BRAIN NATRIURETIC  PEPTIDE - Abnormal; Notable for the following:    B Natriuretic Peptide 488.0 (*)    All other components within normal limits  I-STAT TROPOININ, ED    Imaging Review Dg Chest 2 View  05/22/2014   CLINICAL DATA:  Shortness breath and chest pain for 1 day.  EXAM: CHEST  2 VIEW  COMPARISON:  04/04/2010 and prior radiographs  FINDINGS: The cardiomediastinal silhouette is unremarkable.  Left basilar scarring again noted.  There is no evidence of focal airspace disease, pulmonary edema, suspicious pulmonary nodule/mass, pleural effusion, or pneumothorax. No acute bony abnormalities are identified.  IMPRESSION: No active cardiopulmonary disease.   Electronically Signed   By: Harmon PierJeffrey  Hu M.D.   On: 05/22/2014 19:22   Ct Angio Chest Pe W/cm &/or Wo Cm  05/22/2014   CLINICAL DATA:  Acute onset of shortness of breath and chest pain. Initial encounter.  EXAM: CT ANGIOGRAPHY CHEST WITH CONTRAST  TECHNIQUE: Multidetector CT imaging of the chest was performed using the standard protocol during bolus administration of intravenous contrast. Multiplanar CT image reconstructions and MIPs were obtained to evaluate the vascular anatomy.  CONTRAST:  75mL OMNIPAQUE IOHEXOL 350 MG/ML SOLN  COMPARISON:  Chest radiograph performed earlier today at 6:52 p.m.   FINDINGS: There is no evidence of pulmonary embolus.  Minimal bibasilar atelectasis is noted. The lungs are otherwise clear. There is no evidence of significant focal consolidation, pleural effusion or pneumothorax. No masses are identified; no abnormal focal contrast enhancement is seen.  The mediastinum is unremarkable in appearance. Scattered calcification is seen along the thoracic aorta. The great vessels are grossly unremarkable in appearance. Mediastinal nodes remain normal in size. Diffuse coronary artery calcifications are seen. No pericardial effusion is identified. No axillary lymphadenopathy is seen. The visualized portions of the thyroid gland are unremarkable in appearance.  There is a minimally nodular contour to the liver. This may reflect mild cirrhotic change. Would correlate with the patient's LFTs. The patient is status post cholecystectomy, with clips noted at the gallbladder fossa. The visualized portions of the pancreas, adrenal glands and kidneys are within normal limits.  No acute osseous abnormalities are seen.  Review of the MIP images confirms the above findings.  IMPRESSION: 1. No evidence of pulmonary embolus. 2. Minimal bibasilar atelectasis noted; lungs otherwise clear. 3. Diffuse coronary artery calcifications seen. 4. Minimally nodular contour to the liver. This may reflect mild cirrhotic change. Would correlate with the patient's LFTs.   Electronically Signed   By: Roanna RaiderJeffery  Chang M.D.   On: 05/22/2014 21:32     EKG Interpretation   Date/Time:  Sunday May 22 2014 18:41:36 EDT Ventricular Rate:  175 PR Interval:    QRS Duration: 83 QT Interval:  267 QTC Calculation: 455 R Axis:   27 Text Interpretation:  baseline tremors, sinus rhythm Paired ventricular  premature complexes Anterior infarct, old Nonspecific T abnormalities,  lateral leads Confirmed by YAO  MD, DAVID ([id number]) on 05/22/2014 6:58:14 PM      MDM   Final diagnoses:  Bronchitis  Shortness of breath     81  year old female with a history of hypertension, asthma presents with shortness of breath and chest discomfort for 2 weeks. She also endorses a nonproductive cough. Symptoms have been gradually worsening and with no improvement she decided to seek further care today. Denies any fevers. Chest pain is nonexertional and associated with cough. Denies any diaphoresis, nausea, vomiting, neck or arm pain, abdominal pain. No lower extremity swelling and no history of blood clots.  She is afebrile and in medically stable arrival. No hypoxia and no obvious increased work of breathing. O2 sat is 98% on room air. Lungs are clear to auscultation bilaterally. This does not appear to be primarily respiratory driven shortness of breath and given the chest discomfort higher suspicion for possible cardiac etiology. EKG is difficult to interpret secondary to Parkinson's tremor but does not appear to show ST changes and no arrhythmia or ischemic changes. Chest x-ray is clear. Basic labs including troponin and BNP were also sent for evaluation. Wells score of 0 and lower suspicion for pulmonary embolus and we will defer workup of that at this time.  Laboratory workup reassuring. BNP slightly elevated but no other signs of heart failure. The study returned without signs of pulmonary embolus and no consolidation. Shortness of breath improved with time. It appears this could be related to anxiety versus bronchitis. Will treat with albuterol and PCP follow-up.  Dorna Leitz, MD 05/22/14 2349  Richardean Canal, MD 05/23/14 [phone]

## 2014-05-22 NOTE — ED Notes (Signed)
Patient transported to CT 

## 2014-06-09 ENCOUNTER — Other Ambulatory Visit: Payer: Self-pay | Admitting: Family Medicine

## 2014-06-09 DIAGNOSIS — R4702 Dysphasia: Secondary | ICD-10-CM

## 2014-06-20 ENCOUNTER — Ambulatory Visit
Admission: RE | Admit: 2014-06-20 | Discharge: 2014-06-20 | Disposition: A | Discharge status: Home / Self Care | Payer: PRIVATE HEALTH INSURANCE | Source: Ambulatory Visit | Attending: Family Medicine | Admitting: Family Medicine

## 2014-06-20 DIAGNOSIS — R4702 Dysphasia: Secondary | ICD-10-CM

## 2014-06-22 ENCOUNTER — Other Ambulatory Visit: Payer: Self-pay

## 2014-06-28 ENCOUNTER — Encounter: Payer: Self-pay | Admitting: Physician Assistant

## 2014-07-05 ENCOUNTER — Encounter: Payer: Self-pay | Admitting: Gastroenterology

## 2014-07-12 ENCOUNTER — Encounter: Payer: Self-pay | Admitting: *Deleted

## 2014-07-12 ENCOUNTER — Other Ambulatory Visit: Payer: Self-pay | Admitting: Physician Assistant

## 2014-07-13 ENCOUNTER — Encounter: Payer: Self-pay | Admitting: *Deleted

## 2014-07-14 ENCOUNTER — Encounter: Payer: Self-pay | Admitting: Physician Assistant

## 2014-07-14 ENCOUNTER — Ambulatory Visit (INDEPENDENT_AMBULATORY_CARE_PROVIDER_SITE_OTHER): Payer: Medicare (Managed Care) | Admitting: Physician Assistant

## 2014-07-14 VITALS — BP 124/52 | HR 60 | Ht 63.0 in | Wt 139.4 lb

## 2014-07-14 DIAGNOSIS — K219 Gastro-esophageal reflux disease without esophagitis: Secondary | ICD-10-CM

## 2014-07-14 DIAGNOSIS — R1310 Dysphagia, unspecified: Secondary | ICD-10-CM | POA: Diagnosis not present

## 2014-07-14 NOTE — Progress Notes (Signed)
Reviewed and agree with management plan.  Kolter Reaver T. Miner Koral, MD FACG 

## 2014-07-14 NOTE — Progress Notes (Signed)
Patient ID: Deanna Page, female   DOB: 1929-10-20, 81 y.o.   MRN: [id number]    HPI:  Deanna Page is a 81 y.o.   female referred by Jethro BastosKoehler, Robert N, MD for evaluation of dysphagia.  Deanna Page has a history of Parkinson's disease with dementia, tremor, dyskinesia and dystonia. She has a history of major depression with paranoia and insomnia along with hypertension, stage III kidney disease, peptic ulcer disease, dysphagia with esophageal stricture, esophageal dysmotility, hypothyroidism, osteoarthritis with chronic knee pain, asthma, frequent UTIs, xerostomia, vitamin D deficiency, and hearing loss. She is known to Dr. Russella DarStark with dysphagia and has had endoscopies with dilation in the past. She has also had ischemic colitis. She is accompanied by her son today and reports that she has been having progressive dysphagia to solids especially meats over the past year. Her the past several months she has been living on creams potatoes, soups, pudding, etc. if she has meats she has to cut into very fine pieces. She was sent for a barium swallow by her primary care physician that she had performed on 06/20/2014 and it showed severe esophageal dysmotility with full column stasis of contrast. Tertiary contractions occasionally noted in the mid to lower esophagus. No fixed stricture, fold, thickening or mass. No reflux noted with the water siphon maneuver.   Past Medical History  Diagnosis Date  . Allergic rhinitis   . Asthma   . Major depression     with paranoia and insomnia  . Hypertension   . Pneumonia   . Urinary incontinence   . Hiatal hernia   . GERD (gastroesophageal reflux disease)   . TIA (transient ischemic attack)   . Esophageal stricture 2005  . Diverticulosis   . Hepatitis A   . Hypothyroidism   . Parkinson disease   . Ischemic colitis, enteritis, or enterocolitis 2010  . OSA (obstructive sleep apnea)   . Esophageal dysmotility   . Internal hemorrhoids   . Dementia   . CKD  (chronic kidney disease), stage III   . Vitamin D deficiency   . Anxiety   . Irritable bowel syndrome   . Bowel obstruction     Past Surgical History  Procedure Laterality Date  . Abdominal hysterectomy  1969  . Cholecystectomy  2000  . Tubal ligation  1968  . Appendectomy  1969  . Intraocular prostheses insertion      [facility]  . Breast lumpectomy  1995  . Cataract extraction    . Bladder surgery    . Corneal transplant Left    Family History  Problem Relation Age of Onset  . Lung cancer Father   . ALS Sister   . Liver disease Sister   . Colon cancer Neg Hx   . Stroke Mother   . Parkinson's disease Mother   . Parkinson's disease Maternal Grandmother   . Parkinson's disease Maternal Aunt   . Emphysema Sister   . Emphysema Brother   . Colon polyps Neg Hx   . Diabetes Neg Hx   . Gallbladder disease Neg Hx   . Esophageal cancer Neg Hx    History  Substance Use Topics  . Smoking status: Never Smoker   . Smokeless tobacco: Never Used  . Alcohol Use: No   Current Outpatient Prescriptions  Medication Sig Dispense Refill  . aspirin 81 MG tablet Take 81 mg by mouth daily.      Marland Kitchen. atenolol (TENORMIN) 25 MG tablet Take 25 mg by mouth  daily.      . carbidopa-levodopa (SINEMET IR) 25-100 MG per tablet Take 0.5 tablets by mouth 3 (three) times daily. (Patient taking differently: Take 1 tablet by mouth 3 (three) times daily. ) 60 tablet 2  . Cholecalciferol (VITAMIN D PO) Take 1 tablet by mouth daily.     . citalopram (CELEXA) 10 MG tablet Take 10 mg by mouth at bedtime.      Marland Kitchen estrogens, conjugated, (PREMARIN) 0.625 MG tablet Take 0.625 mg by mouth daily. Take daily for 21 days then do not take for 7 days.    Marland Kitchen levothyroxine (SYNTHROID, LEVOTHROID) 50 MCG tablet Take 50 mcg by mouth daily before breakfast.    . loteprednol (LOTEMAX) 0.5 % ophthalmic suspension Place 1 drop into the right eye 4 (four) times daily.     . memantine (NAMENDA) 10 MG tablet Take 10 mg by mouth  daily.    Marland Kitchen oxyCODONE (OXY IR/ROXICODONE) 5 MG immediate release tablet Take 2.5 mg by mouth at bedtime.    . pantoprazole (PROTONIX) 40 MG tablet Take 40 mg by mouth daily.    Marland Kitchen perphenazine 4 MG tablet Take 4 mg by mouth daily.     . polyethylene glycol powder (GLYCOLAX/MIRALAX) powder Take 1 Container by mouth once.    Marland Kitchen rOPINIRole (REQUIP) 0.5 MG tablet Take 0.5 mg by mouth 2 (two) times daily.     Marland Kitchen SINGULAIR 10 MG tablet TAKE ONE TABLET BY MOUTH EVERY DAY AS NEEDED (Patient taking differently: TAKE ONE TABLET BY MOUTH at bedtime) 90 each 3   No current facility-administered medications for this visit.   Allergies  Allergen Reactions  . Aricept [Donepezil Hcl]   . Doxycycline   . Erythromycin   . Penicillins   . Pneumococcal Vaccines      Review of Systems: Per history of present illness, otherwise negative  Studies: Dg Esophagus  06/20/2014   CLINICAL DATA:  Dysphagia.  EXAM: ESOPHOGRAM/BARIUM SWALLOW  TECHNIQUE: Single contrast examination was performed using  thin barium.  FLUOROSCOPY TIME:  Radiation Exposure Index (as provided by the fluoroscopic device): 11 d Gy cm2  If the device does not provide the exposure index:  Fluoroscopy Time:  dictate in minutes and seconds  Number of Acquired Images:  COMPARISON:  None.  FINDINGS: Severe esophageal dysmotility with full column stasis of contrast. Tertiary contractions occasionally noted in the mid to lower esophagus. No fixed stricture, fold thickening or mass. No reflux noted with the water siphon maneuver.  The patient swallowed a 13 mm barium tablet which freely passed into the stomach.  IMPRESSION: Esophageal dysmotility with full column stasis and occasional tertiary contractions.   Electronically Signed   By: Charlett Nose M.D.   On: 06/20/2014 11:56      Prior Endoscopies:   EGD 11/03/2007 showed gastritis, GERD, a savory dilator was used up to 17 mm patient also had a nodule in the stomach biopsied which was found to be  benign fundic gland polyp. Biopsies were negative for H. pylori. Colonoscopy on 11/23/2008 by Dr. Nedra Hai at [facility] showed probable ischemic colitis but was an incomplete exam due to a redundant and floppy colon. She was instructed to complete a barium enema to evaluate the proximal colon but never did so.  Physical Exam: BP 124/52 mmHg  Pulse 60  Ht  (1.6 m)  Wt 139 lb 6 oz (63.22 kg)  BMI 24.70 kg/m2 Constitutional: Pleasant,well-developedf emale in no acute distress. Resting tremor HEENT: Normocephalic and  atraumatic. Conjunctivae are normal. No scleral icterus. Neck supple. No cervical adenopathy Cardiovascular: Normal rate, regular rhythm. 2/6 murmur Pulmonary/chest: Effort normal and breath sounds normal. No wheezing, rales or rhonchi. Abdominal: Soft, nondistended, nontender. Bowel sounds active throughout. There are no masses palpable. No hepatomegaly. Extremities: no edema Lymphadenopathy: No cervical adenopathy noted. Neurological: Alert and oriented to person place and time. Skin: Skin is warm and dry. No rashes noted. Psychiatric: Normal mood and affect. Behavior is normal.  ASSESSMENT AND PLAN: 81 year old female with Parkinson's now with recurrent dysphagia to solids. Recent esophagram shows esophageal dysmotility with full column stasis and occasional tertiary contractions. Patient states she has had good relief of her dysphagia with dilation in the past. Will plan on EGD with possible dilation..The risks, benefits, and alternatives to endoscopy with possible biopsy and possible dilation were discussed with the patient and they consent to proceed.  If endoscopy is nonrevealing, patient may be a candidate for manometry. EGD to be scheduled with Dr. Russella DarStark.    Deanna Page, Moise BoringLori P PA-C 07/14/2014, 11:54 AM  CC: Jethro BastosKoehler, Robert N, MD

## 2014-07-14 NOTE — Patient Instructions (Signed)

## 2014-07-18 ENCOUNTER — Other Ambulatory Visit: Payer: Self-pay | Admitting: Gastroenterology

## 2014-07-18 ENCOUNTER — Ambulatory Visit (AMBULATORY_SURGERY_CENTER): Payer: Medicare (Managed Care) | Admitting: Gastroenterology

## 2014-07-18 ENCOUNTER — Encounter: Payer: Self-pay | Admitting: Gastroenterology

## 2014-07-18 VITALS — BP 143/70 | HR 66 | Temp 98.5°F | Resp 18 | Ht 63.0 in | Wt 139.0 lb

## 2014-07-18 DIAGNOSIS — K2970 Gastritis, unspecified, without bleeding: Secondary | ICD-10-CM

## 2014-07-18 DIAGNOSIS — R933 Abnormal findings on diagnostic imaging of other parts of digestive tract: Secondary | ICD-10-CM

## 2014-07-18 DIAGNOSIS — R1310 Dysphagia, unspecified: Secondary | ICD-10-CM | POA: Diagnosis not present

## 2014-07-18 MED ORDER — SODIUM CHLORIDE 0.9 % IV SOLN: 500.0000 mL | INTRAVENOUS | Status: DC

## 2014-07-18 NOTE — Patient Instructions (Signed)
YOU HAD AN ENDOSCOPIC PROCEDURE TODAY AT THE [facility]:   Refer to the procedure report that was given to you for any specific questions about what was found during the examination.  If the procedure report does not answer your questions, please call your gastroenterologist to clarify.  If you requested that your care partner not be given the details of your procedure findings, then the procedure report has been included in a sealed envelope for you to review at your convenience later.  YOU SHOULD EXPECT: Some feelings of bloating in the abdomen. Passage of more gas than usual.  Walking can help get rid of the air that was put into your GI tract during the procedure and reduce the bloating. If you had a lower endoscopy (such as a colonoscopy or flexible sigmoidoscopy) you may notice spotting of blood in your stool or on the toilet paper. If you underwent a bowel prep for your procedure, you may not have a normal bowel movement for a few days.  Please Note:  You might notice some irritation and congestion in your nose or some drainage.  This is from the oxygen used during your procedure.  There is no need for concern and it should clear up in a day or so.  SYMPTOMS TO REPORT IMMEDIATELY:    Following upper endoscopy (EGD)  Vomiting of blood or coffee ground material  New chest pain or pain under the shoulder blades  Painful or persistently difficult swallowing  New shortness of breath  Fever of 100F or higher  Black, tarry-looking stools  For urgent or emergent issues, a gastroenterologist can be reached at any hour by calling [phone].   DIET: You may have clear liquids until 10:00am, then you can have a soft diet for the rest of today. Tomorrow you may have a regular diet.  Drink plenty of fluids but you should avoid alcoholic beverages for 24 hours.  ACTIVITY:  You should plan to take it easy for the rest of today and you should NOT DRIVE or use heavy machinery until  tomorrow (because of the sedation medicines used during the test).    FOLLOW UP: Our staff will call the number listed on your records the next business day following your procedure to check on you and address any questions or concerns that you may have regarding the information given to you following your procedure. If we do not reach you, we will leave a message.  However, if you are feeling well and you are not experiencing any problems, there is no need to return our call.  We will assume that you have returned to your regular daily activities without incident.  Read all of the handouts given to you by your recovery room nurse.  If any biopsies were taken you will be contacted by phone or by letter within the next 1-3 weeks.  Please call us at [phone] if you have not heard about the biopsies in 3 weeks.    SIGNATURES/CONFIDENTIALITY: You and/or your care partner have signed paperwork which will be entered into your electronic medical record.  These signatures attest to the fact that that the information above on your After Visit Summary has been reviewed and is understood.  Full responsibility of the confidentiality of this discharge information lies with you and/or your care-partner.

## 2014-07-18 NOTE — Progress Notes (Signed)
Called to room to assist during endoscopic procedure.  Patient ID and intended procedure confirmed with present staff. Received instructions for my participation in the procedure from the performing physician.  

## 2014-07-18 NOTE — Op Note (Addendum)
[facility] [address] [place], [id number]   ENDOSCOPY PROCEDURE REPORT  PATIENT: Deanna Page, [name]  MR#: [id number] BIRTHDATE: Jul 07, 1929 , 84  yrs. old GENDER: female ENDOSCOPIST: Meryl DareMalcolm T Stark, MD, [facility] REFERRED BY:  Marny Lowensteinobert Koehler, M.D. PROCEDURE DATE:  07/18/2014 PROCEDURE:  EGD w/ biopsy and EGD w/ wire guided (savary) dilation  ASA CLASS:     Class III INDICATIONS:  dysphagia and abnormal barium esophagogram. MEDICATIONS: Monitored anesthesia care, Propofol 50 mg IV, and lidocaine 40 mg IV TOPICAL ANESTHETIC: none DESCRIPTION OF PROCEDURE: After the risks benefits and alternatives of the procedure were thoroughly explained, informed consent was obtained.  The LB [id number] endoscope was introduced through the mouth and advanced to the second portion of the duodenum , Without limitations.  The instrument was slowly withdrawn as the mucosa was fully examined.    STOMACH: Mild, focal nonerosive gastritis was found in the gastric body.  Multiple biopsies were performed.   The stomach otherwise appeared normal. ESOPHAGUS: The mucosa of the esophagus appeared normal.   The esophagus was dilated using a 17 mm Savary dilator over a guidewire for dysphagia without a stricture noted. No resistance and trace heme noted. DUODENUM: The duodenal mucosa showed no abnormalities in the bulb and 2nd part of the duodenum.  Retroflexed views revealed no abnormalities.     The scope was then withdrawn from the patient and the procedure completed.  COMPLICATIONS: There were no immediate complications.  ENDOSCOPIC IMPRESSION: 1.   Mild gastritis in the gastric body; multiple biopsies performed 2.   The esophagus appeared normal; dilation using a 17mm (51Fr) savary dilator over guidewire 3.   The EGD otherwise appeared normal  RECOMMENDATIONS: 1.  Anti-reflux regimen 2.  Continue PPI 3.  Post dilation instructions and await pathology 4.  Schedule  manometry if dysphagia persists   eSigned:  Meryl DareMalcolm T Stark, MD, [facility] 07/18/2014 8:59 AM

## 2014-07-18 NOTE — Progress Notes (Signed)
Stable to RR 

## 2014-07-19 ENCOUNTER — Telehealth: Payer: Self-pay

## 2014-07-19 NOTE — Telephone Encounter (Signed)
Left a message at [phone] for the pt to call us back if any questions or concerns. maw

## 2014-07-25 ENCOUNTER — Encounter: Payer: Self-pay | Admitting: Gastroenterology

## 2014-08-17 ENCOUNTER — Ambulatory Visit (INDEPENDENT_AMBULATORY_CARE_PROVIDER_SITE_OTHER): Payer: PRIVATE HEALTH INSURANCE | Admitting: Neurology

## 2014-08-17 ENCOUNTER — Ambulatory Visit: Payer: PRIVATE HEALTH INSURANCE | Admitting: Neurology

## 2014-08-17 ENCOUNTER — Encounter: Payer: Self-pay | Admitting: Neurology

## 2014-08-17 VITALS — BP 110/68 | HR 56 | Ht 63.0 in | Wt 139.0 lb

## 2014-08-17 DIAGNOSIS — G20 Parkinson's disease: Secondary | ICD-10-CM | POA: Diagnosis not present

## 2014-08-17 MED ORDER — CARBIDOPA-LEVODOPA 25-100 MG PO TABS: 1.0000 | ORAL_TABLET | Freq: Four times a day (QID) | ORAL | Status: DC

## 2014-08-17 MED ORDER — ROPINIROLE HCL 0.5 MG PO TABS: 0.5000 mg | ORAL_TABLET | Freq: Three times a day (TID) | ORAL | Status: DC

## 2014-08-17 NOTE — Patient Instructions (Addendum)
Please increase Sinemet to 1 pill 4 times a day, at 10 AM, 2 PM, 6 PM and 10 PM.  Please increase Requip 0.5 mg to 1 pill 3 times a day.  Try to come off of the hydrocodone, talk to Dr. Dorothe Pea about it.  Use your walker at all times.  Try to drink 6 glasses of water, try not to skip meals.

## 2014-08-17 NOTE — Progress Notes (Signed)
Subjective:    Patient ID: Deanna Page is a 81 y.o. female.  HPI     Interim history:  Deanna Page is an 71 -year-old right-handed woman with an underlying medical history of allergic rhinitis, asthma, depression, hypertension, reflux disease, history of TIA, diverticulosis, hypothyroidism, obstructive sleep apnea and colitis, who presents for follow-up consultation of her parkinsonism. The patient is accompanied by her husband today. This is her first visit with me and she previously saw Dr. Elspeth Cho and before then she saw Dr. Vickey Huger in our office. I reviewed Dr. Minus Breeding note from 10/04/2013, which is summarized below (under "Previously").   Today, 08/17/2014: She reports doing fairly. She does not necessarily feel that the medication has been very helpful. She has been on Neupro patch 6 mg in the past and it was stopped because she did not feel it helped. She takes hydrocodone at night and states it's for sleep. She tried going without it and cannot sleep. It is prescribed by her primary care physician. She takes half a pill each night. She does not take the Sinemet or Requip away was recommended last time by Dr. Hosie Poisson. It is unclear why they have not implemented the changes. Her husband indicates that they did not know that they were supposed to increase the medications. They were hoping for a "miracle pill". Currently she is on Sinemet 25-100 milligrams strength one pill twice daily, at 10 AM and 2 PM and sometimes she is taking a third pill around 6 PM. She does not think it is very helpful. She is taking Requip 0.5 mg twice daily. Last time which was August 2015 they were advised to increase Sinemet to 1-1/2 pills 4 times a day and increase Requip to 1 pill 3 times a day. She was for some reason no longer on Artane and they don't know how long she has been off of it and how long she was on it and whether or not it helped. In the past she had seen Dr. Vickey Huger and prior to this she is  to see Dr. Anne Hahn in this office. Symptoms go back to about 5 years ago when she started having tremors and gait disorder as well as problems getting in and out of chairs. At the house she uses a 4 wheeled walker. Sometimes she walks without it. She did not bring her walker today and is situated in her wheelchair. She typically skips breakfast.   Previously:   Dr. Hosie Poisson (10/04/2013):  "HPI:  Deanna Page is a 82 y.o. female here for a PD follow up. Was followed by Dr Vickey Huger in the past. Last visit was 05/2013 at which time she was started on Neupro patch. They note they stopped this a few days after she started and went back to Requip. Unclear why it was stopped, they are also unsure what dose of Requip she is currently on.  Currently taking Sinemet 25-100 TID, they note she takes it at "various times". Takes the 1st dose around 11, takes next dose at 4pm, takes the last dose at bedtime. Notes she forgets to take it a few times a week. Often takes it on a different schedule. Notes improvement when taking this medication, feels more relaxed. Has decreased tremor and muscle shaking. Main concern today is increased shaking and severe anxiety.   Initial visit 02/2013: Diagnosed with PD around 4 to 5 years ago. Initially noted rest tremor and difficulty walking. Predominantly a rest tremor but has mild action/postural component.  Notes muscle stiffness. Notes some generalized bradykinesia, some shuffling of gait. No falls recently. Has some RBD. Has some constipation. No memory troubles, no hallucinations. No difficulty with sense of smell. Currently taking Requip  3x a day, has been on this regimen since diagnosis. Also taking Artane  daily. Tried Sinemet in the past, unsure if it helped but noted it caused a lot of problems.  Current main concerns with PD are tremors, bradykinesia and gait difficulty. Has family history of PD."   Her Past Medical History Is Significant For: Past Medical History   Diagnosis Date  . Allergic rhinitis   . Asthma   . Major depression     with paranoia and insomnia  . Hypertension   . Pneumonia   . Urinary incontinence   . Hiatal hernia   . GERD (gastroesophageal reflux disease)   . TIA (transient ischemic attack)   . Esophageal stricture 2005  . Diverticulosis   . Hepatitis A   . Hypothyroidism   . Parkinson disease   . Ischemic colitis, enteritis, or enterocolitis 2010  . OSA (obstructive sleep apnea)   . Esophageal dysmotility   . Internal hemorrhoids   . Dementia   . CKD (chronic kidney disease), stage III   . Vitamin D deficiency   . Anxiety   . Irritable bowel syndrome   . Bowel obstruction     Her Past Surgical History Is Significant For: Past Surgical History  Procedure Laterality Date  . Abdominal hysterectomy  1969  . Cholecystectomy  2000  . Tubal ligation  1968  . Appendectomy  1969  . Intraocular prostheses insertion      Left eye at [facility]  . Breast lumpectomy  1995  . Cataract extraction    . Bladder surgery    . Corneal transplant Left     Her Family History Is Significant For: Family History  Problem Relation Age of Onset  . Lung cancer Father   . ALS Sister   . Liver disease Sister   . Colon cancer Neg Hx   . Stroke Mother   . Parkinson's disease Mother   . Parkinson's disease Maternal Grandmother   . Parkinson's disease Maternal Aunt   . Emphysema Sister   . Emphysema Brother   . Colon polyps Neg Hx   . Diabetes Neg Hx   . Gallbladder disease Neg Hx   . Esophageal cancer Neg Hx     Her Social History Is Significant For: History   Social History  . Marital Status: Married    Spouse Name: Molly Maduro  . Number of Children: 3  . Years of Education: 12   Occupational History  . Retired    Social History Main Topics  . Smoking status: Never Smoker   . Smokeless tobacco: Never Used  . Alcohol Use: No  . Drug Use: No  . Sexual Activity: Not on file   Other Topics Concern  . None   Social  History Narrative   Patient is married Molly Maduro) and lives with her husband.   Patient has three children.   Patient is retired.   Patient has an high school education.   Patient does drink caffeine beverages.   Patient is left handed.             Her Allergies Are:  Allergies  Allergen Reactions  . Aricept [Donepezil Hcl]   . Doxycycline   . Erythromycin   . Penicillins   . Pneumococcal Vaccines   :  Her Current Medications Are:  Outpatient Encounter Prescriptions as of 08/17/2014  Medication Sig  . aspirin 81 MG tablet Take 81 mg by mouth daily.    Marland Kitchen atenolol (TENORMIN) 25 MG tablet Take 25 mg by mouth daily.    . carbidopa-levodopa (SINEMET IR) 25-100 MG per tablet Take 0.5 tablets by mouth 3 (three) times daily. (Patient taking differently: Take 1 tablet by mouth 3 (three) times daily. )  . Cholecalciferol (VITAMIN D PO) Take 1 tablet by mouth daily.   . citalopram (CELEXA) 10 MG tablet Take 10 mg by mouth at bedtime.    Marland Kitchen estrogens, conjugated, (PREMARIN) 0.625 MG tablet Take 0.625 mg by mouth daily. Take daily for 21 days then do not take for 7 days.  Marland Kitchen levothyroxine (SYNTHROID, LEVOTHROID) 50 MCG tablet Take 50 mcg by mouth daily before breakfast.  . loteprednol (LOTEMAX) 0.5 % ophthalmic suspension Place 1 drop into the right eye 4 (four) times daily.   . memantine (NAMENDA) 10 MG tablet Take 10 mg by mouth daily.  Marland Kitchen oxyCODONE (OXY IR/ROXICODONE) 5 MG immediate release tablet Take 2.5 mg by mouth at bedtime.  . pantoprazole (PROTONIX) 40 MG tablet Take 40 mg by mouth daily.  Marland Kitchen perphenazine 4 MG tablet Take 4 mg by mouth daily.   . polyethylene glycol powder (GLYCOLAX/MIRALAX) powder Take 1 Container by mouth once.  Marland Kitchen rOPINIRole (REQUIP) 0.5 MG tablet Take 0.5 mg by mouth 2 (two) times daily.   Marland Kitchen SINGULAIR 10 MG tablet TAKE ONE TABLET BY MOUTH EVERY DAY AS NEEDED (Patient taking differently: TAKE ONE TABLET BY MOUTH at bedtime)  . triazolam (HALCION) 0.125 MG tablet Take  0.125 mg by mouth daily.   No facility-administered encounter medications on file as of 08/17/2014.  :  Review of Systems:  Out of a complete 14 point review of systems, all are reviewed and negative with the exception of these symptoms as listed below:   Review of Systems  Constitutional: Positive for activity change and fatigue.  HENT: Positive for congestion, dental problem and hearing loss.   Respiratory: Positive for cough and wheezing.   Cardiovascular: Positive for chest pain, palpitations and leg swelling.  Genitourinary: Positive for urgency and pelvic pain.  Neurological: Positive for dizziness, tremors, weakness, light-headedness and headaches.       Parkinson's  Tremors  Walking has declined     Objective:  Neurologic Exam  Physical Exam Physical Examination:   Filed Vitals:   08/17/14 1353  BP: 110/68  Pulse: 56    General Examination: The patient is a very pleasant 81 y.o. female in no acute distress. She is situated in her wheelchair. She appears frail.  HEENT: Normocephalic, atraumatic, pupils are equal, round and reactive to light and accommodation. Funduscopic exam is normal with sharp disc margins noted. Extraocular tracking shows moderate saccadic breakdown without nystagmus noted. There is limitation to upper gaze. There is moderate decrease in eye blink rate. Hearing is intact. Face is symmetric with moderate facial masking and normal facial sensation. There is a mild lower jaw and lower lip tremor. Neck is moderately to severely rigid with intact passive ROM. There are no carotid bruits on auscultation. Oropharynx exam reveals mild mouth dryness. No significant airway crowding is noted. Mallampati is class II. Tongue protrudes centrally and palate elevates symmetrically.   There is no drooling.   Chest: is clear to auscultation without wheezing, rhonchi or crackles noted.  Heart: sounds are regular and normal without murmurs, rubs or gallops noted.  Abdomen: is soft, non-tender and non-distended with normal bowel sounds appreciated on auscultation.  Extremities: There is no pitting edema in the distal lower extremities bilaterally. Pedal pulses are intact.   Skin: is warm and dry with no trophic changes noted.   Musculoskeletal: exam reveals no obvious joint deformities, tenderness, joint swelling or erythema.  Neurologically:  Mental status: The patient is awake and alert, paying good  attention. She is able to partially provide the history. Her husband provides details and most of the history. She is oriented to: person, place, time/date, situation, day of week, month of year and year. Her memory, attention, language and knowledge are impaired mildly. There is no aphasia, agnosia, apraxia or anomia. There is a mild degree of bradyphrenia. Speech is severely hypophonic with mild dysarthria noted. Mood is congruent and affect is normal.  Her MMSE score is 27/30. CDT is 0/4. AFT (Animal Fluency Test) score is 6. Geriatric Depression Scale Score is 9.   Cranial nerves are as described above under HEENT exam. In addition, shoulder shrug is normal with equal shoulder height noted.  Motor exam: Normal bulk, and strength for age is noted. There are no dyskinesias noted. Tone is moderately rigid with presence of cogwheeling in the upper extremities. There is overall moderate bradykinesia. There is no drift or rebound.  There is a mild resting tremor in both upper extremities.  she does not have a resting tremor in the lower extremities. On fine motor skills testing she has moderate to severe difficulties in both upper and both lower extremities. She is slightly worse on the left overall. I did not have her stand or walk for me today as she did not bring her walker.  Sensory exam is intact to light touch.  Romberg was therefore also not tested.   Assessment and Plan:   In summary, YVONNIE SCHINKE is a very pleasant 81 y.o.-year old female  with an underlying medical history of allergic rhinitis, asthma, depression, hypertension, reflux disease, history of TIA, diverticulosis, hypothyroidism, obstructive sleep apnea and colitis, who presents for follow-up consultation of her parkinsonism. Her history and physical exam supports the diagnosis of left-sided predominant Parkinson's disease. She does appear undermedicated and they did not implement the medication changes that were recommended last time by Dr. Hosie Poisson. Looking at previous records it seems that there have been issues with medical compliance in the past. I suggested that we increase Sinemet to 1 pill 4 times a day. I asked her to increase Requip to 1 pill 3 times a day. She's currently off of Artane and has tried Neupro patch which she believes did not help. I would like for her to discuss with her primary care physician whether she could come off of her narcotic pain medication which is every night.  I had a long chat with the patient and her husband about the diagnosis of parkinsonism or Parkinson's disease, its prognosis and treatment options. We talked about medical treatments and non-pharmacological approaches. We talked about maintaining a healthy lifestyle in general. I encouraged the patient to eat healthy, exercise daily and keep well hydrated, to keep a scheduled bedtime and wake time routine, to not skip any meals and eat healthy snacks in between meals and to have protein with every meal. In particular, I stressed the importance of regular exercise, within of course the patient's own mobility limitations.    As far as medications are concerned, I recommended the following at this time: Continue Sinemet 25/100 mg strength, 1  pill 4 times a day at 10 AM, 2 PM, 6 PM and 10 PM. I asked her to increase Requip to 0.5 mg 1 pill 3 times a day. She is advised not to skip any meals and drink plenty of water, about 6 glasses a day. She is advised to stay active and use her walker at all  times for gait safety.  I answered all their questions today and the patient and her husband were in agreement. I provided instructions in writing and suggested that her husband oversee her medications to have her stay on schedule with her medications. He was in agreement. I would like to see her back in about 4 months, sooner if needed. I spent 20 minutes in total face-to-face time with the patient, more than 50% of which was spent in counseling and coordination of care, reviewing test results, reviewing medication and discussing or reviewing the diagnosis of PD, its prognosis and treatment options.

## 2014-12-12 ENCOUNTER — Encounter: Payer: Self-pay | Admitting: Neurology

## 2014-12-12 ENCOUNTER — Ambulatory Visit (INDEPENDENT_AMBULATORY_CARE_PROVIDER_SITE_OTHER): Payer: PRIVATE HEALTH INSURANCE | Admitting: Neurology

## 2014-12-12 VITALS — BP 122/54 | HR 60 | Resp 14

## 2014-12-12 DIAGNOSIS — R2681 Unsteadiness on feet: Secondary | ICD-10-CM | POA: Diagnosis not present

## 2014-12-12 DIAGNOSIS — G20 Parkinson's disease: Secondary | ICD-10-CM

## 2014-12-12 NOTE — Progress Notes (Signed)
Subjective:    Patient ID: Deanna Page is a 81 y.o. female.  HPI     Interim history:   Deanna Page is an 81 year old right-handed woman with an underlying medical history of allergic rhinitis, asthma, depression, hypertension, reflux disease, history of TIA, diverticulosis, hypothyroidism, obstructive sleep apnea and colitis, who presents for follow-up consultation of her parkinsonism. The patient is accompanied by her husband today. I first met her on 08/17/2014, at which time she reported no significant improvement of her symptoms on her medications. She had tried Neupro patch 6 mg in the past but this was stopped because it did not help. She was using hydrocodone at night for sleep and had tried sleeping without it but could not sleep. She was taking half a pill at night. She was not taking the Sinemet or Requip the way it had been suggested by Dr. [name] and was unclear why changes had not been implemented. She was on Sinemet 25-100 milligrams strength one pill twice daily at 10 AM and 2 PM and she would take a third pill sometimes around 6 PM. She did not think was very helpful. She was taking 0.5 mg of Requip twice daily. She had been advised to increase Sinemet to 1-1/2 pills 4 times a day and increase Requip to 1 pill 3 times a day in August 2015. She was no longer on Artane for unclear reasons.  She had seen Dr. [name] in the past and also Dr. [name] in this office. Symptoms go back to about 5 years ago when she started having tremors and gait disorder as well as problems getting in and out of chairs. At the house she uses a 4 wheeled walker. She reported that she typically skipped breakfast. I suggested that we increase Sinemet to 1 pill 4 times a day. I asked her to increase Requip 0.5 mg to 1 pill 3 times a day. She was advised to drink more water and to not skip any meals.  Today, 12/12/2014: She reports doing fairly. Her husband provides additional information. He works 3 days a  week on Tuesdays, Wednesdays and Thursdays. She has an aid on those days. She has had no recent falls. She takes her Sinemet about 3 times a day. Her first dose may not be until noon from what I understand. She sleeps in late till about 10:30 AM. She goes to bed around 10:30 PM but is up multiple times a night. She still does not eat any breakfast. She does not eat very much. She tries to drink enough water.  Previously:  She previously saw Dr. [name] and before then she saw Dr. [name] in our office. I reviewed Dr. Hazle Quant note from 10/04/2013, which is summarized below.   (10/04/2013, Dr. Wilber Bihari):  "HPI:  Deanna Page is a 81 y.o. female here for a PD follow up. Was followed by Dr [name] in the past. Last visit was 05/2013 at which time she was started on Neupro patch. They note they stopped this a few days after she started and went back to Requip. Unclear why it was stopped, they are also unsure what dose of Requip she is currently on.  Currently taking Sinemet 25-100 TID, they note she takes it at "various times". Takes the 1st dose around 11, takes next dose at 4pm, takes the last dose at bedtime. Notes she forgets to take it a few times a week. Often takes it on a different schedule. Notes improvement when taking this medication,  feels more relaxed. Has decreased tremor and muscle shaking. Main concern today is increased shaking and severe anxiety.   Initial visit 02/2013 (Dr. Wilber Bihari): Diagnosed with PD around 4 to 5 years ago. Initially noted rest tremor and difficulty walking. Predominantly a rest tremor but has mild action/postural component. Notes muscle stiffness. Notes some generalized bradykinesia, some shuffling of gait. No falls recently. Has some RBD. Has some constipation. No memory troubles, no hallucinations. No difficulty with sense of smell. Currently taking Requip 29m 3x a day, has been on this regimen since diagnosis. Also taking Artane 2105mdaily. Tried Sinemet in the past,  unsure if it helped but noted it caused a lot of problems.   Current main concerns with PD are tremors, bradykinesia and gait difficulty. Has family history of PD."    Her Past Medical History Is Significant For: Past Medical History  Diagnosis Date  . Allergic rhinitis   . Asthma   . Major depression ([place]    with paranoia and insomnia  . Hypertension   . Pneumonia   . Urinary incontinence   . Hiatal hernia   . GERD (gastroesophageal reflux disease)   . TIA (transient ischemic attack)   . Esophageal stricture 2005  . Diverticulosis   . Hepatitis A   . Hypothyroidism   . Parkinson disease ([facility]  . Ischemic colitis, enteritis, or enterocolitis ([facility]  . OSA (obstructive sleep apnea)   . Esophageal dysmotility   . Internal hemorrhoids   . Dementia   . CKD (chronic kidney disease), stage III   . Vitamin D deficiency   . Anxiety   . Irritable bowel syndrome   . [facility] obstruction ([facility]    Her Past Surgical History Is Significant For: Past Surgical History  Procedure Laterality Date  . Abdominal hysterectomy  1969  . Cholecystectomy  2000  . Tubal ligation  1968  . Appendectomy  1969  . Intraocular prostheses insertion      [facility] Breast lumpectomy  1995  . Cataract extraction    . Bladder surgery    . Corneal transplant Left     Her Family History Is Significant For: Family History  Problem Relation Age of Onset  . Lung cancer Father   . ALS Sister   . Liver disease Sister   . Colon cancer Neg Hx   . Stroke Mother   . Parkinson's disease Mother   . Parkinson's disease Maternal Grandmother   . Parkinson's disease Maternal Aunt   . Emphysema Sister   . Emphysema Brother   . Colon polyps Neg Hx   . Diabetes Neg Hx   . Gallbladder disease Neg Hx   . Esophageal cancer Neg Hx     Her Social History Is Significant For: Social History   Social History  . Marital Status: Married    Spouse Name: RoHerbie Baltimore. Number of Children: 3  . Years of  Education: 12   Occupational History  . Retired    Social History Main Topics  . Smoking status: Never Smoker   . Smokeless tobacco: Never Used  . Alcohol Use: No  . Drug Use: No  . Sexual Activity: Not Asked   Other Topics Concern  . None   Social History Narrative   Patient is married (RHerbie Baltimoreand lives with her husband.   Patient has three children.   Patient is retired.   Patient has an high school education.   Patient does  drink caffeine beverages.   Patient is left handed.             Her Allergies Are:  Allergies  Allergen Reactions  . Aricept [Donepezil Hcl]   . Doxycycline   . Erythromycin   . Penicillins   . Pneumococcal Vaccines   :   Her Current Medications Are:  Outpatient Encounter Prescriptions as of 12/12/2014  Medication Sig  . aspirin 81 MG tablet Take 81 mg by mouth daily.    Marland Kitchen atenolol (TENORMIN) 25 MG tablet Take 25 mg by mouth daily.    . carbidopa-levodopa (SINEMET IR) 25-100 MG per tablet Take 1 tablet by mouth 4 (four) times daily.  . Cholecalciferol (VITAMIN D PO) Take 1 tablet by mouth daily.   . citalopram (CELEXA) 10 MG tablet Take 10 mg by mouth at bedtime.    Marland Kitchen estrogens, conjugated, (PREMARIN) 0.625 MG tablet Take 0.625 mg by mouth daily. Take daily for 21 days then do not take for 7 days.  Marland Kitchen levothyroxine (SYNTHROID, LEVOTHROID) 50 MCG tablet Take 50 mcg by mouth daily before breakfast.  . loteprednol (LOTEMAX) 0.5 % ophthalmic suspension Place 1 drop into the right eye 4 (four) times daily.   . memantine (NAMENDA) 10 MG tablet Take 10 mg by mouth daily.  Marland Kitchen oxyCODONE (OXY IR/ROXICODONE) 5 MG immediate release tablet Take 2.5 mg by mouth at bedtime.  . pantoprazole (PROTONIX) 40 MG tablet Take 40 mg by mouth daily.  Marland Kitchen perphenazine 4 MG tablet Take 4 mg by mouth daily.   . polyethylene glycol powder (GLYCOLAX/MIRALAX) powder Take 1 Container by mouth once.  Marland Kitchen rOPINIRole (REQUIP) 0.5 MG tablet Take 1 tablet (0.5 mg total) by mouth 3  (three) times daily.  Marland Kitchen SINGULAIR 10 MG tablet TAKE ONE TABLET BY MOUTH EVERY DAY AS NEEDED (Patient taking differently: TAKE ONE TABLET BY MOUTH at bedtime)  . triazolam (HALCION) 0.125 MG tablet Take 0.125 mg by mouth daily.   No facility-administered encounter medications on file as of 12/12/2014.  :  Review of Systems:  Out of a complete 14 point review of systems, all are reviewed and negative with the exception of these symptoms as listed below:   Review of Systems  Neurological:       Decreased ability to walk. Reported pain in both legs and knees, increased weakness all over, R foot weakness, trouble sleeping at night, bach pain, abdominal pain, increased tremors.   Husband reports that patient does not always get her fourth dose of Carb/Levo.     Objective:  Neurologic Exam  Physical Exam Physical Examination:   Filed Vitals:   12/12/14 1549  BP: 122/54  Pulse: 60  Resp: 14    General Examination: The patient is a very pleasant 81 y.o. female in no acute distress. She is situated in her wheelchair. She appears frail.   HEENT: Normocephalic, atraumatic, pupils are equal, round and reactive to light and accommodation. Extraocular tracking shows moderate saccadic breakdown without nystagmus noted. There is limitation to upper gaze. There is moderate decrease in eye blink rate. Hearing is intact. Face is symmetric with moderate facial masking and normal facial sensation. There is a mild lower jaw and lower lip tremor. Neck is moderately to severely rigid with intact passive ROM. There are no carotid bruits on auscultation. Oropharynx exam reveals mild mouth dryness. No significant airway crowding is noted. Mallampati is class II. Tongue protrudes centrally and palate elevates symmetrically.   There is no drooling.   Chest:  is clear to auscultation without wheezing, rhonchi or crackles noted.  Heart: sounds are regular and normal without murmurs, rubs or gallops noted.    Abdomen: is soft, non-tender and non-distended with normal bowel sounds appreciated on auscultation.  Extremities: There is no pitting edema in the distal lower extremities bilaterally. Pedal pulses are intact.   Skin: is warm and dry with no trophic changes noted.   Musculoskeletal: exam reveals no obvious joint deformities, tenderness, joint swelling or erythema.  Neurologically:  Mental status: The patient is awake and alert, paying good  attention. She is able to partially provide the history. Her husband provides details and most of the history. She is oriented to: person, place, time/date, situation, day of week, month of year and year. Her memory, attention, language and knowledge are impaired mildly. There is no aphasia, agnosia, apraxia or anomia. There is a mild degree of bradyphrenia. Speech is severely hypophonic with mild dysarthria noted. Mood is congruent and affect is normal.   In June 2016: Her MMSE score is 27/30. CDT is 0/4. AFT (Animal Fluency Test) score is 6. Geriatric Depression Scale Score is 9.   Cranial nerves are as described above under HEENT exam. In addition, shoulder shrug is normal with equal shoulder height noted.  Motor exam: Normal bulk, and strength for age is noted. There are no dyskinesias noted. Tone is moderately rigid with presence of cogwheeling in the upper extremities. There is overall moderate bradykinesia. There is no drift or rebound. Reflexes are about 1+ throughout. There is a mild to moderate resting tremor in both upper extremities. She does not have a resting tremor in the lower extremities. On fine motor skills testing she has moderate to severe difficulties in both upper and both lower extremities. She is slightly worse on the left overall. She stands with difficulty and needs assistance. She walks with some assistance with shortened stride length and pace and decreased arm swing bilaterally. She turns in multiple steps. Her balance is mildly  impaired. Romberg is not tested because she is not safe to stand unsupported. Sensory exam is intact to light touch in the upper and lower extremities.  Assessment and Plan:   In summary, Deanna Page is a very pleasant 81 year old female with an underlying complex medical history of allergic rhinitis, asthma, depression, hypertension, reflux disease, history of TIA, diverticulosis, hypothyroidism, obstructive sleep apnea and colitis, who presents for follow-up consultation of her parkinsonism of 5 or 6 years duration, possibly left-sided predominant Parkinson's disease. She has been on requip 0.5 mg tid and and has been on C/L 25/100 mg 1 pill about 3 times a with some regularity. She is advised to try to take the sinemet without food and on a 3 hourly basis, about 10:30, 1:30, 4:30 and 7:30 PM. I asked her to keep Requip at 1 pill 3 times a day. She has currently off of Artane and has tried Neupro patch in the past.   I had a long chat with the patient and her husband about the diagnosis of parkinsonism or Parkinson's disease, its prognosis and treatment options. We talked about medical treatments and non-pharmacological approaches. We talked about maintaining a healthy lifestyle in general. I encouraged the patient to eat healthy, exercise daily and keep well hydrated, to keep a scheduled bedtime and wake time routine, to not skip any meals and eat healthy snacks in between meals and to have protein with every meal. In particular, I stressed the importance of regular exercise, within  of course the patient's own mobility limitations. She is advised to use her walker at all times.  I will see her back in 4 months, sooner if needed. I answered all their questions today and the patient and her husband were in agreement. I provided instructions in writing and suggested that her husband oversee her medications to have her stay on schedule with her medications.  I spent 25 minutes in total face-to-face time  with the patient, more than 50% of which was spent in counseling and coordination of care, reviewing test results, reviewing medication and discussing or reviewing the diagnosis of PD, its prognosis and treatment options.

## 2014-12-12 NOTE — Patient Instructions (Addendum)
I think your Parkinson's disease has remained fairly stable, which is reassuring. Nevertheless, as you know, this disease does progress with time. It can affect your balance, your memory, your mood, your bowel and bladder function, your posture, balance and walking and your activities of daily living. However, there are good supportive treatments and symptomatic treatments available, so most patients have a change to a good quality life and life expectancy is not typically altered. Overall you are doing fairly well but I do want to suggest a few things today:  Remember to drink plenty of fluid at least 6 glasses (8 oz each), eat healthy meals and do not skip any meals. Try to eat protein with a every meal and eat a healthy snack such as fruit or nuts in between meals. Try to keep a regular sleep-wake schedule and try to exercise daily, particularly in the form of walking, 20-30 minutes a day, if you can.   Taking your medication on schedule is key.   Try to stay active physically and mentally. Engage in social activities in your community and with your family and try to keep up with current events by reading the newspaper or watching the news. Try to do word puzzles and you may like to do puzzles and brain games on the computer such as on [email or web address]/.   As far as your medications are concerned, I would like to suggest that you take your current medication with the following additional changes: No changes. Please take your sinemet 4 times a day, try to take it without food, about 10:30 AM, 1:30 PM, 4:30 PM and 7:30 PM. Keep the Requip at 0.5 mg 3 times a day.      I would like to see you back in 4 months, sooner if we need to. Please call us with any interim questions, concerns, problems, updates or refill requests.  Our phone number is [phone]. We also have an after hours call service for urgent matters and there is a physician on-call for urgent questions, that cannot wait till the next work day.  For any emergencies you know to call 911 or go to the nearest emergency room.   You can email me through my chart and also leave a phone message for Lafonda Mosses, my nurse.

## 2015-03-27 ENCOUNTER — Telehealth: Payer: Self-pay | Admitting: Neurology

## 2015-04-11 ENCOUNTER — Ambulatory Visit (INDEPENDENT_AMBULATORY_CARE_PROVIDER_SITE_OTHER): Payer: PRIVATE HEALTH INSURANCE | Admitting: Neurology

## 2015-04-11 ENCOUNTER — Encounter: Payer: Self-pay | Admitting: Neurology

## 2015-04-11 VITALS — BP 136/60 | HR 68 | Resp 14

## 2015-04-11 DIAGNOSIS — F419 Anxiety disorder, unspecified: Secondary | ICD-10-CM

## 2015-04-11 DIAGNOSIS — G20 Parkinson's disease: Secondary | ICD-10-CM | POA: Diagnosis not present

## 2015-04-11 MED ORDER — ROPINIROLE HCL 0.5 MG PO TABS: 0.5000 mg | ORAL_TABLET | Freq: Three times a day (TID) | ORAL | Status: DC

## 2015-04-11 MED ORDER — CITALOPRAM HYDROBROMIDE 20 MG PO TABS: 20.0000 mg | ORAL_TABLET | Freq: Every day | ORAL | Status: DC

## 2015-04-11 MED ORDER — CARBIDOPA-LEVODOPA 25-100 MG PO TABS: 1.0000 | ORAL_TABLET | Freq: Four times a day (QID) | ORAL | Status: DC

## 2015-04-11 NOTE — Patient Instructions (Signed)
Let's keep your medications the same, but we will increase your citalopram to 20 mg each evening.

## 2015-04-11 NOTE — Progress Notes (Signed)
Subjective:    Patient ID: Deanna Page is a 82 y.o. female.  HPI     Interim history:   Deanna Page is an 82 year old right-handed woman with an underlying medical history of allergic rhinitis, asthma, depression, hypertension, reflux disease, history of TIA, diverticulosis, hypothyroidism, obstructive sleep apnea and colitis, who presents for follow-up consultation of her parkinsonism. The patient is accompanied by her husband today. I last saw her on 12/12/2014, at which time she reported doing fairly. Her husband was working 3 days a week, on Tuesdays, Wednesdays and Thursdays. She had an aid on those days. She had no recent falls, thankfully. She was on Sinemet about 3 times a day. Her first dose may not be until noon as she was sleeping in late, till about 10:30 AM. She was going t bed around 10:30 PM but was up multiple times a night. She was skipping breakfast and was not drinking enough. I suggested she increase Sinemet to 4 times a day, 10:30, 1:30 PM, 5:30 PM and 7:30 PM. I asked her to continue Requip 0.5 mg 3 times a day.   Today, 04/11/2015: She reports feeling stable for the most part, some increase in anxiety. She has had no recent falls, memory, per husband, is also stable. She is not overtly depressed but she feels more anxious. She has been on the same dose of Celexa for years as I understand. She takes Sinemet 4 times a day, usually around 10 AM, 2 PM, 5 PM and 9 PM. She takes Requip 0.5 mg 3 times a day. She has occasional constipation for which she takes over-the-counter and Dulcolax. She tries to drink enough water. She uses her wheelchair or a rolling walker. She has had no recent falls.  Previously:  I first met her on 08/17/2014, at which time she reported no significant improvement of her symptoms on her medications. She had tried Neupro patch 6 mg in the past but this was stopped because it did not help. She was using hydrocodone at night for sleep and had tried  sleeping without it but could not sleep. She was taking half a pill at night. She was not taking the Sinemet or Requip the way it had been suggested by Dr. [name] and was unclear why changes had not been implemented. She was on Sinemet 25-100 milligrams strength one pill twice daily at 10 AM and 2 PM and she would take a third pill sometimes around 6 PM. She did not think was very helpful. She was taking 0.5 mg of Requip twice daily. She had been advised to increase Sinemet to 1-1/2 pills 4 times a day and increase Requip to 1 pill 3 times a day in August 2015. She was no longer on Artane for unclear reasons.   She had seen Dr. [name] in the past and also Dr. [name] in this office. Symptoms go back to about 5 years ago when she started having tremors and gait disorder as well as problems getting in and out of chairs. At the house she uses a 4 wheeled walker. She reported that she typically skipped breakfast. I suggested that we increase Sinemet to 1 pill 4 times a day. I asked her to increase Requip 0.5 mg to 1 pill 3 times a day. She was advised to drink more water and to not skip any meals.  She previously saw Dr. [name] and before then she saw Dr. [name] in our office. I reviewed Dr. Hazle Quant note from 10/04/2013, which  is summarized below.   (10/04/2013, Dr. Wilber Bihari):  "HPI:  Deanna Page is a 82 y.o. female here for a PD follow up. Was followed by Dr [name] in the past. Last visit was 05/2013 at which time she was started on Neupro patch. They note they stopped this a few days after she started and went back to Requip. Unclear why it was stopped, they are also unsure what dose of Requip she is currently on.  Currently taking Sinemet 25-100 TID, they note she takes it at "various times". Takes the 1st dose around 11, takes next dose at 4pm, takes the last dose at bedtime. Notes she forgets to take it a few times a week. Often takes it on a different schedule. Notes improvement when taking this  medication, feels more relaxed. Has decreased tremor and muscle shaking. Main concern today is increased shaking and severe anxiety.   Initial visit 02/2013 (Dr. Wilber Bihari): Diagnosed with PD around 4 to 5 years ago. Initially noted rest tremor and difficulty walking. Predominantly a rest tremor but has mild action/postural component. Notes muscle stiffness. Notes some generalized bradykinesia, some shuffling of gait. No falls recently. Has some RBD. Has some constipation. No memory troubles, no hallucinations. No difficulty with sense of smell. Currently taking Requip 27m 3x a day, has been on this regimen since diagnosis. Also taking Artane 219mdaily. Tried Sinemet in the past, unsure if it helped but noted it caused a lot of problems.   Current main concerns with PD are tremors, bradykinesia and gait difficulty. Has family history of PD."   Her Past Medical History Is Significant For: Past Medical History  Diagnosis Date  . Allergic rhinitis   . Asthma   . Major depression ([facility]    with paranoia and insomnia  . Hypertension   . Pneumonia   . Urinary incontinence   . Hiatal hernia   . GERD (gastroesophageal reflux disease)   . TIA (transient ischemic attack)   . Esophageal stricture 2005  . Diverticulosis   . Hepatitis A   . Hypothyroidism   . Parkinson disease ([facility]  . Ischemic colitis, enteritis, or enterocolitis ([facility]  . OSA (obstructive sleep apnea)   . Esophageal dysmotility   . Internal hemorrhoids   . Dementia   . CKD (chronic kidney disease), stage III   . Vitamin D deficiency   . Anxiety   . Irritable bowel syndrome   . [facility]    Her Past Surgical History Is Significant For: Past Surgical History  Procedure Laterality Date  . Abdominal hysterectomy  1969  . Cholecystectomy  2000  . Tubal ligation  1968  . Appendectomy  1969  . Intraocular prostheses insertion      [facility] Breast lumpectomy  1995  . Cataract extraction    . Bladder  surgery    . Corneal transplant Left     Her Family History Is Significant For: Family History  Problem Relation Age of Onset  . Lung cancer Father   . ALS Sister   . Liver disease Sister   . Colon cancer Neg Hx   . Stroke Mother   . Parkinson's disease Mother   . Parkinson's disease Maternal Grandmother   . Parkinson's disease Maternal Aunt   . Emphysema Sister   . Emphysema Brother   . Colon polyps Neg Hx   . Diabetes Neg Hx   . Gallbladder disease Neg Hx   .  Esophageal cancer Neg Hx     Her Social History Is Significant For: Social History   Social History  . Marital Status: Married    Spouse Name: Herbie Baltimore  . Number of Children: 3  . Years of Education: 12   Occupational History  . Retired    Social History Main Topics  . Smoking status: Never Smoker   . Smokeless tobacco: Never Used  . Alcohol Use: No  . Drug Use: No  . Sexual Activity: Not Asked   Other Topics Concern  . None   Social History Narrative   Patient is married Herbie Baltimore) and lives with her husband.   Patient has three children.   Patient is retired.   Patient has an high school education.   Patient does drink caffeine beverages.   Patient is left handed.             Her Allergies Are:  Allergies  Allergen Reactions  . Aricept [Donepezil Hcl]   . Doxycycline   . Erythromycin   . Penicillins   . Pneumococcal Vaccines   :   Her Current Medications Are:  Outpatient Encounter Prescriptions as of 04/11/2015  Medication Sig  . aspirin 81 MG tablet Take 81 mg by mouth daily.    Marland Kitchen atenolol (TENORMIN) 25 MG tablet Take 25 mg by mouth daily.    . carbidopa-levodopa (SINEMET IR) 25-100 MG per tablet Take 1 tablet by mouth 4 (four) times daily.  . Cholecalciferol (VITAMIN D PO) Take 1 tablet by mouth daily.   . citalopram (CELEXA) 10 MG tablet Take 10 mg by mouth at bedtime.    Marland Kitchen estrogens, conjugated, (PREMARIN) 0.625 MG tablet Take 0.625 mg by mouth daily. Take daily for 21 days then do not  take for 7 days.  Marland Kitchen levothyroxine (SYNTHROID, LEVOTHROID) 50 MCG tablet Take 50 mcg by mouth daily before breakfast.  . loteprednol (LOTEMAX) 0.5 % ophthalmic suspension Place 1 drop into the right eye 4 (four) times daily.   . memantine (NAMENDA) 10 MG tablet Take 10 mg by mouth daily.  Marland Kitchen oxyCODONE (OXY IR/ROXICODONE) 5 MG immediate release tablet Take 2.5 mg by mouth at bedtime.  . pantoprazole (PROTONIX) 40 MG tablet Take 40 mg by mouth daily.  Marland Kitchen perphenazine 4 MG tablet Take 4 mg by mouth daily.   Marland Kitchen rOPINIRole (REQUIP) 0.5 MG tablet Take 1 tablet (0.5 mg total) by mouth 3 (three) times daily.  Marland Kitchen SINGULAIR 10 MG tablet TAKE ONE TABLET BY MOUTH EVERY DAY AS NEEDED (Patient taking differently: TAKE ONE TABLET BY MOUTH at bedtime)  . triazolam (HALCION) 0.125 MG tablet Take 0.125 mg by mouth daily.  . [DISCONTINUED] polyethylene glycol powder (GLYCOLAX/MIRALAX) powder Take 1 Container by mouth once.   No facility-administered encounter medications on file as of 04/11/2015.  :  Review of Systems:  Out of a complete 14 point review of systems, all are reviewed and negative with the exception of these symptoms as listed below:   Review of Systems  Neurological:       Patient is here for f/u. No new complaints or problems.     Objective:  Neurologic Exam  Physical Exam Physical Examination:   Filed Vitals:   04/11/15 1443  BP: 136/60  Pulse: 68  Resp: 14    General Examination: The patient is a very pleasant 82 y.o. female in no acute distress. She is situated in her wheelchair. She appears frail.   HEENT: Normocephalic, atraumatic, pupils are equal, round and  reactive to light and accommodation. Extraocular tracking shows moderate saccadic breakdown without nystagmus noted. There is limitation to upper gaze. There is moderate decrease in eye blink rate. Hearing is intact. Face is symmetric with moderate facial masking and normal facial sensation. There is a mild lower jaw and lower  lip tremor. Neck is moderately to severely rigid with intact passive ROM. There are no carotid bruits on auscultation. Oropharynx exam reveals mild mouth dryness. No significant airway crowding is noted. Mallampati is class II. Tongue protrudes centrally and palate elevates symmetrically.   There is no drooling.   Chest: is clear to auscultation without wheezing, rhonchi or crackles noted.  Heart: sounds are regular and normal without murmurs, rubs or gallops noted.   Abdomen: is soft, non-tender and non-distended with normal bowel sounds appreciated on auscultation.  Extremities: There is no pitting edema in the distal lower extremities bilaterally. Pedal pulses are intact.   Skin: is warm and dry with no trophic changes noted.   Musculoskeletal: exam reveals no obvious joint deformities, tenderness, joint swelling or erythema.  Neurologically:  Mental status: The patient is awake and alert, paying good  attention. She is able to partially provide the history. Her husband provides details and most of the history. She is oriented to: person, place, time/date, situation, day of week, month of year and year. Her memory, attention, language and knowledge are impaired mildly. There is no aphasia, agnosia, apraxia or anomia. There is a mild degree of bradyphrenia. Speech is severely hypophonic with mild dysarthria noted. Mood is congruent and affect is normal.   In June 2016: Her MMSE score is 27/30. CDT is 0/4. AFT (Animal Fluency Test) score is 6. Geriatric Depression Scale Score is 9.   On 04/11/2015: MMSE: 24/30, CDT: 1/4, AFT: 8/min.  Cranial nerves are as described above under HEENT exam. In addition, shoulder shrug is normal with equal shoulder height noted.  Motor exam: thin bulk, and global strength of 4/5 is noted. There are no dyskinesias noted. Tone is moderately rigid with presence of cogwheeling in the upper extremities. There is overall moderate bradykinesia. There is no drift or  rebound. Reflexes are about 1+ throughout. There is a mild to moderate resting tremor in both upper extremities. She does not have a resting tremor in the lower extremities. On fine motor skills testing she has moderate to severe difficulties in both upper and both lower extremities. She is slightly worse on the left overall. She stands with difficulty and needs assistance. Posture is moderately stooped. She walks with some assistance with shortened stride length and pace and decreased arm swing bilaterally. She turns in multiple steps. Her balance is mildly impaired. Romberg is not tested because she is not safe to stand unsupported. Sensory exam is intact to light touch in the upper and lower extremities.  Assessment and Plan:   In summary, Deanna Page is a very pleasant 82 year old female with an underlying complex medical history of allergic rhinitis, asthma, depression, hypertension, reflux disease, history of TIA, diverticulosis, hypothyroidism, obstructive sleep apnea and colitis, who presents for follow-up consultation of her parkinsonism of about 6 years' duration, possibly left-sided predominant Parkinson's disease. She has been on requip 0.5 mg tid and and has been on C/L 25/100 mg 1 pill qid with some regularity. She is advised to continue with her medications. Her memory scores appear to be fairly stable. She has been on generic Namenda 10 mg twice daily. She is off of Artane and tried Neupro  patch in the past. She has had more anxiety. She has been on the same dose of Celexa for years. I would like to try to increase it to 20 mg once daily. I adjusted her prescription in that regard and refilled her Sinemet and Requip at this time. I encouraged her to continue to mobilize with the use of her walker and drink enough water.  I would like to see her back in 6 months, sooner if needed. I answered all their questions today and the patient and her husband were in agreement.  I spent 25 minutes  in total face-to-face time with the patient, more than 50% of which was spent in counseling and coordination of care, reviewing test results, reviewing medication and discussing or reviewing the diagnosis of PD, its prognosis and treatment options.

## 2015-05-01 ENCOUNTER — Ambulatory Visit: Payer: PRIVATE HEALTH INSURANCE | Admitting: Neurology

## 2015-10-09 ENCOUNTER — Ambulatory Visit (INDEPENDENT_AMBULATORY_CARE_PROVIDER_SITE_OTHER): Payer: PRIVATE HEALTH INSURANCE | Admitting: Neurology

## 2015-10-09 ENCOUNTER — Encounter: Payer: Self-pay | Admitting: Neurology

## 2015-10-09 VITALS — BP 108/56 | HR 62 | Resp 14

## 2015-10-09 DIAGNOSIS — F419 Anxiety disorder, unspecified: Secondary | ICD-10-CM | POA: Diagnosis not present

## 2015-10-09 DIAGNOSIS — R2681 Unsteadiness on feet: Secondary | ICD-10-CM | POA: Diagnosis not present

## 2015-10-09 DIAGNOSIS — G20A1 Parkinson's disease without dyskinesia, without mention of fluctuations: Secondary | ICD-10-CM

## 2015-10-09 DIAGNOSIS — G20 Parkinson's disease: Secondary | ICD-10-CM | POA: Diagnosis not present

## 2015-10-09 MED ORDER — ROPINIROLE HCL 0.5 MG PO TABS: 0.5000 mg | ORAL_TABLET | Freq: Three times a day (TID) | ORAL | 5 refills | Status: DC

## 2015-10-09 MED ORDER — CITALOPRAM HYDROBROMIDE 20 MG PO TABS: 20.0000 mg | ORAL_TABLET | Freq: Every day | ORAL | 5 refills | Status: DC

## 2015-10-09 MED ORDER — CARBIDOPA-LEVODOPA 25-100 MG PO TABS: 1.0000 | ORAL_TABLET | Freq: Four times a day (QID) | ORAL | 5 refills | Status: DC

## 2015-10-09 NOTE — Patient Instructions (Addendum)
We will try to get your sinemet up to 4 times a day. The first dose at noon is just too late.   How about trying for a 9 AM, 1 PM, 5 PM and 9 PM schedule for your Sinemet.   You can continue with the Celexa 20 mg daily and the Requip 0.5 mg 3 times a day.   Try to drink enough water, about 6 glasses (8 oz each) per day, try to walk with your care takers.

## 2015-10-09 NOTE — Progress Notes (Deleted)
Subjective:    Patient ID: Deanna Page is a 82 y.o. female.  HPI {Common ambulatory SmartLinks:[id number]}  Review of Systems  Objective:  Neurologic Exam  Physical Exam  Assessment:   ***  Plan:   ***

## 2015-10-09 NOTE — Progress Notes (Signed)
Subjective:    Patient ID: Deanna Page is a 82 y.o. female.  HPI     Interim history:   Deanna Page is an 82 year old right-handed woman with an underlying medical history of allergic rhinitis, asthma, depression, hypertension, reflux disease, history of TIA, diverticulosis, hypothyroidism, obstructive sleep apnea and colitis, who presents for follow-up consultation of her parkinsonism. The patient is accompanied by her husband today. I last saw her on 04/11/2015, at which time she reported feeling stable for the most part, some increase in anxiety, no recent falls, memory, per husband, was stable. She denied overt depression, had been on the same dose of Celexa for years. She was on Sinemet 4 times a day, usually around 10 AM, 2 PM, 5 PM and 9 PM and Requip 0.5 mg 3 times a day. For occasional constipation, she was on Dulcolax. She was using her wheelchair or rolling walker. Her MMSE was 24/30, CDT: 1/4, AFT: 8/min at the time. I suggested we increase her Celexa to 20 mg. She was on Namenda generic twice daily.   Today, 10/09/2015: She reports more trouble with her balance. Her gait is worse. She has trouble feeding herself. She continues to take ropinirole 3 times a day 0.5 mg strength and generic Sinemet 1 pill about 3 times a day, but schedule is still off, does sleep in late every day. Has constipation, has erratic BMs. Has not seen GI.  Her husband works 3 days a week. She has helped through home health agency, 3 days a week, usually from 10:30 through 4 PM. She may not get her first dose of Sinemet and before noon.   Previously:   I saw her on 12/12/2014, at which time she reported doing fairly. Her husband was working 3 days a week, on Tuesdays, Wednesdays and Thursdays. She had an aid on those days. She had no recent falls, thankfully. She was on Sinemet about 3 times a day. Her first dose may not be until noon as she was sleeping in late, till about 10:30 AM. She was going t bed  around 10:30 PM but was up multiple times a night. She was skipping breakfast and was not drinking enough. I suggested she increase Sinemet to 4 times a day, 10:30, 1:30 PM, 5:30 PM and 7:30 PM. I asked her to continue Requip 0.5 mg 3 times a day.    I first met her on 08/17/2014, at which time she reported no significant improvement of her symptoms on her medications. She had tried Neupro patch 6 mg in the past but this was stopped because it did not help. She was using hydrocodone at night for sleep and had tried sleeping without it but could not sleep. She was taking half a pill at night. She was not taking the Sinemet or Requip the way it had been suggested by Dr. [name] and was unclear why changes had not been implemented. She was on Sinemet 25-100 milligrams strength one pill twice daily at 10 AM and 2 PM and she would take a third pill sometimes around 6 PM. She did not think was very helpful. She was taking 0.5 mg of Requip twice daily. She had been advised to increase Sinemet to 1-1/2 pills 4 times a day and increase Requip to 1 pill 3 times a day in August 2015. She was no longer on Artane for unclear reasons.   She had seen Dr. [name] in the past and also Dr. [name] in this office. Symptoms go back  to about 5 years ago when she started having tremors and gait disorder as well as problems getting in and out of chairs. At the house she uses a 4 wheeled walker. She reported that she typically skipped breakfast. I suggested that we increase Sinemet to 1 pill 4 times a day. I asked her to increase Requip 0.5 mg to 1 pill 3 times a day. She was advised to drink more water and to not skip any meals.   She previously saw Dr. [name] and before then she saw Dr. [name] in our office. I reviewed Dr. Hazle Quant note from 10/04/2013, which is summarized below.    (10/04/2013, Dr. Wilber Bihari):  "HPI:  Deanna Page is a 82 y.o. female here for a PD follow up. Was followed by Dr [name] in the past. Last  visit was 05/2013 at which time she was started on Neupro patch. They note they stopped this a few days after she started and went back to Requip. Unclear why it was stopped, they are also unsure what dose of Requip she is currently on.  Currently taking Sinemet 25-100 TID, they note she takes it at "various times". Takes the 1st dose around 11, takes next dose at 4pm, takes the last dose at bedtime. Notes she forgets to take it a few times a week. Often takes it on a different schedule. Notes improvement when taking this medication, feels more relaxed. Has decreased tremor and muscle shaking. Main concern today is increased shaking and severe anxiety.    Initial visit 02/2013 (Dr. Wilber Bihari): Diagnosed with PD around 4 to 5 years ago. Initially noted rest tremor and difficulty walking. Predominantly a rest tremor but has mild action/postural component. Notes muscle stiffness. Notes some generalized bradykinesia, some shuffling of gait. No falls recently. Has some RBD. Has some constipation. No memory troubles, no hallucinations. No difficulty with sense of smell. Currently taking Requip 34m 3x a day, has been on this regimen since diagnosis. Also taking Artane 226mdaily. Tried Sinemet in the past, unsure if it helped but noted it caused a lot of problems.   Current main concerns with PD are tremors, bradykinesia and gait difficulty. Has family history of PD."    Her Past Medical History Is Significant For: Past Medical History:  Diagnosis Date  . Allergic rhinitis   . Anxiety   . Asthma   . Bowel obstruction ([facility]  . CKD (chronic kidney disease), stage III   . Dementia   . Diverticulosis   . Esophageal dysmotility   . Esophageal stricture 2005  . GERD (gastroesophageal reflux disease)   . Hepatitis A   . Hiatal hernia   . Hypertension   . Hypothyroidism   . Internal hemorrhoids   . Irritable bowel syndrome   . Ischemic colitis, enteritis, or enterocolitis ([facility]  . Major depression ([facility]    with paranoia and insomnia  . OSA (obstructive sleep apnea)   . Parkinson disease ([facility]  . Pneumonia   . TIA (transient ischemic attack)   . Urinary incontinence   . Vitamin D deficiency     Her Past Surgical History Is Significant For: Past Surgical History:  Procedure Laterality Date  . ABDOMINAL HYSTERECTOMY  1969  . APPENDECTOMY  1969  . BLADDER SURGERY    . BREAST LUMPECTOMY  1995  . CATARACT EXTRACTION    . CHOLECYSTECTOMY  2000  . CORNEAL TRANSPLANT Left   . INTRAOCULAR PROSTHESES INSERTION     Left  eye at [facility]  . TUBAL LIGATION  1968    Her Family History Is Significant For: Family History  Problem Relation Age of Onset  . Lung cancer Father   . ALS Sister   . Liver disease Sister   . Colon cancer Neg Hx   . Stroke Mother   . Parkinson's disease Mother   . Parkinson's disease Maternal Grandmother   . Parkinson's disease Maternal Aunt   . Emphysema Sister   . Emphysema Brother   . Colon polyps Neg Hx   . Diabetes Neg Hx   . Gallbladder disease Neg Hx   . Esophageal cancer Neg Hx     Her Social History Is Significant For: Social History   Social History  . Marital status: Married    Spouse name: Herbie Baltimore  . Number of children: 3  . Years of education: 12   Occupational History  . Retired Retired   Social History Main Topics  . Smoking status: Never Smoker  . Smokeless tobacco: Never Used  . Alcohol use No  . Drug use: No  . Sexual activity: Not Asked   Other Topics Concern  . None   Social History Narrative   Patient is married Herbie Baltimore) and lives with her husband.   Patient has three children.   Patient is retired.   Patient has an high school education.   Patient does drink caffeine beverages.   Patient is left handed.             Her Allergies Are:  Allergies  Allergen Reactions  . Aricept [Donepezil Hcl]   . Doxycycline   . Erythromycin   . Penicillins   . Pneumococcal Vaccines   :   Her Current Medications Are:   Outpatient Encounter Prescriptions as of 10/09/2015  Medication Sig  . aspirin 81 MG tablet Take 81 mg by mouth daily.    Marland Kitchen atenolol (TENORMIN) 25 MG tablet Take 25 mg by mouth daily.    . carbidopa-levodopa (SINEMET IR) 25-100 MG tablet Take 1 tablet by mouth 4 (four) times daily.  . citalopram (CELEXA) 20 MG tablet Take 1 tablet (20 mg total) by mouth at bedtime.  Marland Kitchen estrogens, conjugated, (PREMARIN) 0.625 MG tablet Take 0.625 mg by mouth daily. Take daily for 21 days then do not take for 7 days.  Marland Kitchen levothyroxine (SYNTHROID, LEVOTHROID) 50 MCG tablet Take 50 mcg by mouth daily before breakfast.  . loteprednol (LOTEMAX) 0.5 % ophthalmic suspension Place 1 drop into the right eye 4 (four) times daily.   . memantine (NAMENDA) 10 MG tablet Take 10 mg by mouth daily.  Marland Kitchen oxyCODONE (OXY IR/ROXICODONE) 5 MG immediate release tablet Take 2.5 mg by mouth at bedtime.  . pantoprazole (PROTONIX) 40 MG tablet Take 40 mg by mouth daily.  Marland Kitchen perphenazine 4 MG tablet Take 4 mg by mouth daily.   Marland Kitchen rOPINIRole (REQUIP) 0.5 MG tablet Take 1 tablet (0.5 mg total) by mouth 3 (three) times daily.  Marland Kitchen SINGULAIR 10 MG tablet TAKE ONE TABLET BY MOUTH EVERY DAY AS NEEDED (Patient taking differently: TAKE ONE TABLET BY MOUTH at bedtime)  . [DISCONTINUED] Cholecalciferol (VITAMIN D PO) Take 1 tablet by mouth daily.   . [DISCONTINUED] triazolam (HALCION) 0.125 MG tablet Take 0.125 mg by mouth daily.   No facility-administered encounter medications on file as of 10/09/2015.   :  Review of Systems:  Out of a complete 14 point review of systems, all are reviewed and negative with the exception of  these symptoms as listed below: Review of Systems  Neurological:       Husband reports that patient's balance and gait is worse. Increased trouble feeding self.     Objective:  Neurologic Exam  Physical Exam Physical Examination:   Vitals:   10/09/15 1258  BP: (!) 108/56  Pulse: 62  Resp: 14    General Examination: The  patient is a very pleasant 82 y.o. female in no acute distress. She is situated in her wheelchair. She appears frail and deconditioned.Marland Kitchen   HEENT: Normocephalic, atraumatic, pupils are equal, round and reactive to light and accommodation. Extraocular tracking shows moderate saccadic breakdown without nystagmus noted. There is limitation to upper gaze. There is moderate decrease in eye blink rate. Hearing is intact. Face is symmetric with moderate facial masking and normal facial sensation. There is a mild lower jaw and lower lip tremor. Neck is moderately to severely rigid with intact passive ROM. There are no carotid bruits on auscultation. Oropharynx exam reveals mild mouth dryness. No significant airway crowding is noted. Mallampati is class II. Tongue protrudes centrally and palate elevates symmetrically.   There is no drooling.   Chest: is clear to auscultation without wheezing, rhonchi or crackles noted.  Heart: sounds are regular and normal without murmurs, rubs or gallops noted.   Abdomen: is soft, non-tender and non-distended with normal bowel sounds appreciated on auscultation.  Extremities: There is no pitting edema in the distal lower extremities bilaterally. Pedal pulses are intact.   Skin: is warm and dry with no trophic changes noted.   Musculoskeletal: exam reveals no obvious joint deformities, tenderness, joint swelling or erythema.  Neurologically:  Mental status: The patient is awake and alert, paying good  attention. She is able to partially provide the history. Her husband provides details and most of the history. She is oriented to: person, place, time/date, situation, day of week, month of year and year. Her memory, attention, language and knowledge are impaired mildly. There is no aphasia, agnosia, apraxia or anomia. There is a mild to moderate degree of bradyphrenia. Speech is severely hypophonic with mild dysarthria noted. Mood is congruent and affect is normal.   In June  2016: Her MMSE score is 27/30. CDT is 0/4. AFT (Animal Fluency Test) score is 6. Geriatric Depression Scale Score is 9.   On 04/11/2015: MMSE: 24/30, CDT: 1/4, AFT: 8/min.  On 10/09/2015: MMSE: 26/30, CDT: 1/4, AFT: 9/min.  Cranial nerves are as described above under HEENT exam. In addition, shoulder shrug is normal with equal shoulder height noted.  Motor exam: thin bulk, and global strength of 4/5 is noted. There are no dyskinesias noted. Tone is moderately rigid with presence of cogwheeling in the upper extremities. There is overall moderate bradykinesia. There is no drift or rebound. Reflexes are about 1+ throughout. There is a mild to moderate resting tremor in both upper extremities. She does not have a resting tremor in the lower extremities. On fine motor skills testing she has moderate to severe difficulties in both upper and both lower extremities. She is slightly worse on the left overall. She stands with difficulty and needs assistance. Posture is moderately stooped. She walks with some assistance with shortened stride length and pace and decreased arm swing bilaterally. She turns in multiple steps. Her balance is mildly impaired. Romberg is not tested because she is not safe to stand unsupported. Sensory exam is intact to light touch in the upper and lower extremities.  Assessment and Plan:   In  summary, Deanna Page is a very pleasant 82 year old female with an underlying complex medical history of allergic rhinitis, asthma, depression, hypertension, reflux disease, history of TIA, diverticulosis, hypothyroidism, obstructive sleep apnea and colitis, who presents for follow-up consultation of her parkinsonism of about 6 years' duration, possibly left-sided predominant Parkinson's disease. She has been on requip 0.5 mg tid and and has been on C/L 25/100 mg, But has not been getting the medication and with regularity. She does not often get her first dose of Sinemet and before noon. We  talked about her schedule quite a bit today. I suggested she try to go to bed a little earlier than 10:30 to 11 PM, perhaps between 9 and 9:30 PM so to be able to get up a little earlier in the morning and get her first dose of Sinemet and by 9 AM, perhaps her home health aide can come in earlier as well. I suggested a schedule of 1 pill 4 times a day, at 9 AM, 1 PM, 5 PM and 9 PM. She can continue with Requip 3 times a day and Namenda generic twice daily. I renewed her prescription for Celexa 20 mg once daily as well as the prescription for Sinemet and Requip. Overall, her exam has remained stable. Memory scores appear to be fairly stable, perhaps even slightly better today. She tolerates generic Namenda 10 mg twice a day and has been off of Aricept and off of Artane, off of Neupro which she tried in the past. I encouraged her to continue to mobilize with the use of her walker and with the caretakers, and drink enough water.  I would like to see her back in 6 months, sooner if needed. I answered all their questions today and the patient and her husband were in agreement.  I spent 25 minutes in total face-to-face time with the patient, more than 50% of which was spent in counseling and coordination of care, reviewing test results, reviewing medication and discussing or reviewing the diagnosis of PD, its prognosis and treatment options.

## 2015-12-27 ENCOUNTER — Ambulatory Visit (INDEPENDENT_AMBULATORY_CARE_PROVIDER_SITE_OTHER): Payer: PRIVATE HEALTH INSURANCE | Admitting: Orthopedic Surgery

## 2016-01-08 ENCOUNTER — Ambulatory Visit (INDEPENDENT_AMBULATORY_CARE_PROVIDER_SITE_OTHER): Payer: PRIVATE HEALTH INSURANCE

## 2016-01-08 ENCOUNTER — Ambulatory Visit (INDEPENDENT_AMBULATORY_CARE_PROVIDER_SITE_OTHER): Payer: PRIVATE HEALTH INSURANCE | Admitting: Orthopedic Surgery

## 2016-01-08 ENCOUNTER — Encounter (INDEPENDENT_AMBULATORY_CARE_PROVIDER_SITE_OTHER): Payer: Self-pay | Admitting: Orthopedic Surgery

## 2016-01-08 DIAGNOSIS — M25512 Pain in left shoulder: Secondary | ICD-10-CM | POA: Diagnosis not present

## 2016-01-08 DIAGNOSIS — G8929 Other chronic pain: Secondary | ICD-10-CM | POA: Diagnosis not present

## 2016-01-08 MED ORDER — METHYLPREDNISOLONE ACETATE 40 MG/ML IJ SUSP
40.0000 mg | INTRAMUSCULAR | Status: AC | PRN
  Administered 2016-01-08: 40 mg via INTRA_ARTICULAR

## 2016-01-08 MED ORDER — LIDOCAINE HCL 1 % IJ SOLN
5.0000 mL | INTRAMUSCULAR | Status: AC | PRN
  Administered 2016-01-08: 5 mL

## 2016-01-08 MED ORDER — BUPIVACAINE HCL 0.5 % IJ SOLN
9.0000 mL | INTRAMUSCULAR | Status: AC | PRN
  Administered 2016-01-08: 9 mL via INTRA_ARTICULAR

## 2016-01-08 NOTE — Progress Notes (Signed)
Office Visit Note   Patient: Deanna Page           Date of Birth: 1929/05/11           MRN: [id number] Visit Date: 01/08/2016 Requested by: Jethro Bastosobert N Koehler, MD [address] [place], [place] [id number] PCP: Thane EduKOEHLER,ROBERT NICHOLAS, MD  Subjective: Chief Complaint  Patient presents with  . Left Shoulder - Pain    HPI Deanna Page is a 82 year old patient with left shoulder pain.  Describes decreased range of motion for the past 3 months.  Denies a history of injury.  States that when her husband Lister up the holes under the left arm and she has pain after that intervention.  Takes oxycodone half tab at night for sleep.  Describes no real neck symptoms.  Has not had an injury or fall on the left shoulder.              Review of Systems All systems reviewed are negative as they relate to the chief complaint within the history of present illness.  Patient denies  fevers or chills.    Assessment & Plan: Visit Diagnoses:  1. Chronic left shoulder pain     Plan: Impression is left shoulder bursitis without evidence of dislocation fracture or rotator cuff pathology.  I think she may have early rotator cuff arthropathy but doesn't look like there is been anything acute.  I think the injection may help her but it could take a few days to kick in.  See her back as needed  Follow-Up Instructions: Return if symptoms worsen or fail to improve.   Orders:  Orders Placed This Encounter  Procedures  . XR Shoulder Left   No orders of the defined types were placed in this encounter.     Procedures: Large Joint Inj Date/Time: 01/08/2016 4:31 PM Performed by: Cammy CopaEAN, SCOTT GREGORY Authorized by: Cammy CopaEAN, SCOTT GREGORY   Consent Given by:  Patient Site marked: the procedure site was marked   Timeout: prior to procedure the correct patient, procedure, and site was verified   Indications:  Pain and diagnostic evaluation Location:  Shoulder Site:  L subacromial bursa Prep: patient was prepped and  draped in usual sterile fashion   Needle Size:  18 G Needle Length:  1.5 inches Approach:  Posterior Ultrasound Guidance: No   Fluoroscopic Guidance: No   Arthrogram: No Medications:  5 mL lidocaine 1 %; 9 mL bupivacaine 0.5 %; 40 mg methylPREDNISolone acetate 40 MG/ML Aspiration Attempted: No   Patient tolerance:  Patient tolerated the procedure well with no immediate complications     Clinical Data: No additional findings.  Objective: Vital Signs: There were no vitals taken for this visit.  Physical Exam  Constitutional: She appears well-developed.  HENT:  Head: Normocephalic.  Eyes: EOM are normal.  Neck: Normal range of motion.  Cardiovascular: Normal rate.   Pulmonary/Chest: Effort normal.  Neurological: She is alert.  Skin: Skin is warm.  Psychiatric: She has a normal mood and affect.    Ortho Exam on his orthopedic exam the patient does have tremors.  She has a little bit of rigidity more on the left side than the right to passive range of motion of the upper extremities.  The shoulder itself is reduced bilaterally.  Cuff strength appears to be good.  Deltoid fires.  There is no bruising crepitus or coarseness with passive range of motion on the left.  Patient localizes the pain to the superior aspect of the shoulder  in the deltoid and acromial region.  Specialty Comments:  No specialty comments available.  Imaging: Xr Shoulder Left  Result Date: 01/08/2016 AP output view left shoulder ordered and obtained.  Lung fields clear.  No fracture present.  Acromiohumeral distance is narrowed.  Before meals joint degenerative changes are present    PMFS History: Patient Active Problem List   Diagnosis Date Noted  . Parkinson's disease (HCC) 03/01/2013  . GASTRITIS 02/22/2010  . ABDOMINAL PAIN-EPIGASTRIC 02/22/2010  . DIVERTICULOSIS-COLON 12/19/2008  . OTHER DYSPHAGIA 10/07/2007  . TIA 01/21/2007  . DIARRHEA 01/21/2007  . GAIT DISTURBANCE 01/01/2007  .  GASTROESOPHAGEAL REFLUX DISEASE 12/20/2006  . HYSTERECTOMY, HX OF 12/20/2006  . APPENDECTOMY, HX OF 12/20/2006  . SYNCOPE 10/01/2006  . SYMPTOM, MEMORY LOSS 09/16/2006  . BRONCHITIS, ACUTE 06/30/2006  . DEPRESSION 06/02/2006  . HYPERTENSION 06/02/2006  . ALLERGIC RHINITIS 06/02/2006  . ASTHMA 06/02/2006  . PROLAPSE, VAGINAL WALL, RECTOCELE 06/02/2006  . DIZZINESS OR VERTIGO 06/02/2006  . URINARY INCONTINENCE 06/02/2006  . PNEUMONIA, HX OF 06/02/2006  . BLADDER REPAIR, HX OF 06/02/2006  . INSOMNIA, HX OF 06/02/2006   Past Medical History:  Diagnosis Date  . Allergic rhinitis   . Anxiety   . Asthma   . Bowel obstruction   . CKD (chronic kidney disease), stage III   . Dementia   . Diverticulosis   . Esophageal dysmotility   . Esophageal stricture 2005  . GERD (gastroesophageal reflux disease)   . Hepatitis A   . Hiatal hernia   . Hypertension   . Hypothyroidism   . Internal hemorrhoids   . Irritable bowel syndrome   . Ischemic colitis, enteritis, or enterocolitis (HCC) 2010  . Major depression    with paranoia and insomnia  . OSA (obstructive sleep apnea)   . Parkinson disease (HCC)   . Pneumonia   . TIA (transient ischemic attack)   . Urinary incontinence   . Vitamin D deficiency     Family History  Problem Relation Age of Onset  . Lung cancer Father   . ALS Sister   . Liver disease Sister   . Colon cancer Neg Hx   . Stroke Mother   . Parkinson's disease Mother   . Parkinson's disease Maternal Grandmother   . Parkinson's disease Maternal Aunt   . Emphysema Sister   . Emphysema Brother   . Colon polyps Neg Hx   . Diabetes Neg Hx   . Gallbladder disease Neg Hx   . Esophageal cancer Neg Hx     Past Surgical History:  Procedure Laterality Date  . ABDOMINAL HYSTERECTOMY  1969  . APPENDECTOMY  1969  . BLADDER SURGERY    . BREAST LUMPECTOMY  1995  . CATARACT EXTRACTION    . CHOLECYSTECTOMY  2000  . CORNEAL TRANSPLANT Left   . INTRAOCULAR PROSTHESES  INSERTION     [facility]  . TUBAL LIGATION  1968   Social History   Occupational History  . Retired Retired   Social History Main Topics  . Smoking status: Never Smoker  . Smokeless tobacco: Never Used  . Alcohol use No  . Drug use: No  . Sexual activity: Not on file

## 2016-04-15 ENCOUNTER — Ambulatory Visit (INDEPENDENT_AMBULATORY_CARE_PROVIDER_SITE_OTHER): Payer: Medicare (Managed Care) | Admitting: Neurology

## 2016-04-15 ENCOUNTER — Encounter: Payer: Self-pay | Admitting: Neurology

## 2016-04-15 ENCOUNTER — Telehealth: Payer: Self-pay | Admitting: *Deleted

## 2016-04-15 VITALS — BP 130/62 | HR 72

## 2016-04-15 DIAGNOSIS — G20 Parkinson's disease: Secondary | ICD-10-CM

## 2016-04-15 DIAGNOSIS — F419 Anxiety disorder, unspecified: Secondary | ICD-10-CM

## 2016-04-15 DIAGNOSIS — R413 Other amnesia: Secondary | ICD-10-CM

## 2016-04-15 MED ORDER — CARBIDOPA-LEVODOPA 25-100 MG PO TABS: 1.5000 | ORAL_TABLET | Freq: Four times a day (QID) | ORAL | 5 refills | Status: DC

## 2016-04-15 NOTE — Telephone Encounter (Signed)
Kristen, Please advise me on this. Thanks Annabelle Harmanana

## 2016-04-15 NOTE — Progress Notes (Signed)
Subjective:    Patient ID: Deanna Page is a 83 y.o. female.  HPI     Interim history:   Deanna Page is an 83 year old left-handed woman with an underlying medical history of allergic rhinitis, asthma, depression, hypertension, reflux disease, history of TIA, diverticulosis, hypothyroidism, obstructive sleep apnea and colitis, who presents for follow-up consultation of her parkinsonism. The patient is accompanied by her husband today. I last saw her on 10/09/2015, at which time she reported more difficulty with her balance. Her walking was worse. She had trouble feeding herself. She was on ropinirole 0.5 mg 3 times a day and generic Sinemet 1 pill 3 times a day, she was not starting her first dose in the morning as she was sleeping in late every day, she had constipation, erratic bowel movements. Her husband was working 3 days a week, she had held 3 days week from 10:30 AM to 4 PM. She was typically not getting her first dose of Sinemet before noon. Her MMSE was 26 out of 30, clock drawing was 1 out of 4, animal fluency 9/m. She was encouraged to drink more water, change her sleep schedule and start the Sinemet earlier in the morning and increase it to 4 times a day at 9 AM, 1 PM, 5 PM and 9 PM. She was encouraged to continue with Requip 0.5 mg 3 times a day and Celexa 20 mg daily.  Today, 04/15/2016: She reports that the medication does not seem to help. May have been on C/L and Requip for about 6-7 years. No recent falls, has ongoing issues with constipation, has BM every other day. Has miralax, but does not take it with any regularity. Dulcolax seems to be too strong. appetite okay, but spells of cough when eating. no recent acute illness. Has help with [facility] agency, M-thu 10:30 to to 4 PM and some help on Sun prn. husband works 2 days/week. He does not recall which side her symptoms started. He does not believe the medication is effective. Take Sinemet 1 pill 4 times a day, usually around  11:30, 2:30, 5:30 and 8:45 PM. Goes to bed around 9.  The patient's allergies, current medications, family history, past medical history, past social history, past surgical history and problem list were reviewed and updated as appropriate.   Previously (copied from previous notes for reference):   I saw her on 04/11/2015, at which time she reported feeling stable for the most part, some increase in anxiety, no recent falls, memory, per husband, was stable. She denied overt depression, had been on the same dose of Celexa for years. She was on Sinemet 4 times a day, usually around 10 AM, 2 PM, 5 PM and 9 PM and Requip 0.5 mg 3 times a day. For occasional constipation, she was on Dulcolax. She was using her wheelchair or rolling walker. Her MMSE was 24/30, CDT: 1/4, AFT: 8/min at the time. I suggested we increase her Celexa to 20 mg. She was on Namenda generic twice daily.    I saw her on 12/12/2014, at which time she reported doing fairly. Her husband was working 3 days a week, on Tuesdays, Wednesdays and Thursdays. She had an aid on those days. She had no recent falls, thankfully. She was on Sinemet about 3 times a day. Her first dose may not be until noon as she was sleeping in late, till about 10:30 AM. She was going t bed around 10:30 PM but was up multiple times a night. She  was skipping breakfast and was not drinking enough. I suggested she increase Sinemet to 4 times a day, 10:30, 1:30 PM, 5:30 PM and 7:30 PM. I asked her to continue Requip 0.5 mg 3 times a day.    I first met her on 08/17/2014, at which time she reported no significant improvement of her symptoms on her medications. She had tried Neupro patch 6 mg in the past but this was stopped because it did not help. She was using hydrocodone at night for sleep and had tried sleeping without it but could not sleep. She was taking half a pill at night. She was not taking the Sinemet or Requip the way it had been suggested by Dr. [name] and was  unclear why changes had not been implemented. She was on Sinemet 25-100 milligrams strength one pill twice daily at 10 AM and 2 PM and she would take a third pill sometimes around 6 PM. She did not think was very helpful. She was taking 0.5 mg of Requip twice daily. She had been advised to increase Sinemet to 1-1/2 pills 4 times a day and increase Requip to 1 pill 3 times a day in August 2015. She was no longer on Artane for unclear reasons.   She had seen Dr. [name] in the past and also Dr. [name] in this office. Symptoms go back to about 5 years ago when she started having tremors and gait disorder as well as problems getting in and out of chairs. At the house she uses a 4 wheeled walker. She reported that she typically skipped breakfast. I suggested that we increase Sinemet to 1 pill 4 times a day. I asked her to increase Requip 0.5 mg to 1 pill 3 times a day. She was advised to drink more water and to not skip any meals.   She previously saw Dr. [name] and before then she saw Dr. [name] in our office. I reviewed Dr. Hazle Quant note from 10/04/2013, which is summarized below.    (10/04/2013, Dr. Wilber Bihari):  "HPI:  Deanna Page is a 83 y.o. female here for a PD follow up. Was followed by Dr [name] in the past. Last visit was 05/2013 at which time she was started on Neupro patch. They note they stopped this a few days after she started and went back to Requip. Unclear why it was stopped, they are also unsure what dose of Requip she is currently on.  Currently taking Sinemet 25-100 TID, they note she takes it at "various times". Takes the 1st dose around 11, takes next dose at 4pm, takes the last dose at bedtime. Notes she forgets to take it a few times a week. Often takes it on a different schedule. Notes improvement when taking this medication, feels more relaxed. Has decreased tremor and muscle shaking. Main concern today is increased shaking and severe anxiety.    Initial visit 02/2013 (Dr.  Wilber Bihari): Diagnosed with PD around 4 to 5 years ago. Initially noted rest tremor and difficulty walking. Predominantly a rest tremor but has mild action/postural component. Notes muscle stiffness. Notes some generalized bradykinesia, some shuffling of gait. No falls recently. Has some RBD. Has some constipation. No memory troubles, no hallucinations. No difficulty with sense of smell. Currently taking Requip 48m 3x a day, has been on this regimen since diagnosis. Also taking Artane 231mdaily. Tried Sinemet in the past, unsure if it helped but noted it caused a lot of problems.   Current main concerns with PD  are tremors, bradykinesia and gait difficulty. Has family history of PD."    Her Past Medical History Is Significant For: Past Medical History:  Diagnosis Date  . Allergic rhinitis   . Anxiety   . Asthma   . Bowel obstruction   . CKD (chronic kidney disease), stage III   . Dementia   . Diverticulosis   . Esophageal dysmotility   . Esophageal stricture 2005  . GERD (gastroesophageal reflux disease)   . Hepatitis A   . Hiatal hernia   . Hypertension   . Hypothyroidism   . Internal hemorrhoids   . Irritable bowel syndrome   . Ischemic colitis, enteritis, or enterocolitis ([place]) 2010  . Major depression    with paranoia and insomnia  . OSA (obstructive sleep apnea)   . Parkinson disease ([place])   . Pneumonia   . TIA (transient ischemic attack)   . Urinary incontinence   . Vitamin D deficiency     Her Past Surgical History Is Significant For: Past Surgical History:  Procedure Laterality Date  . ABDOMINAL HYSTERECTOMY  1969  . APPENDECTOMY  1969  . BLADDER SURGERY    . BREAST LUMPECTOMY  1995  . CATARACT EXTRACTION    . CHOLECYSTECTOMY  2000  . CORNEAL TRANSPLANT Left   . INTRAOCULAR PROSTHESES INSERTION     Left eye at [facility]  . TUBAL LIGATION  1968    Her Family History Is Significant For: Family History  Problem Relation Age of Onset  . Lung cancer Father   . ALS Sister    . Liver disease Sister   . Colon cancer Neg Hx   . Stroke Mother   . Parkinson's disease Mother   . Parkinson's disease Maternal Grandmother   . Parkinson's disease Maternal Aunt   . Emphysema Sister   . Emphysema Brother   . Colon polyps Neg Hx   . Diabetes Neg Hx   . Gallbladder disease Neg Hx   . Esophageal cancer Neg Hx     Her Social History Is Significant For: Social History   Social History  . Marital status: Married    Spouse name: Herbie Baltimore  . Number of children: 3  . Years of education: 12   Occupational History  . Retired Retired   Social History Main Topics  . Smoking status: Never Smoker  . Smokeless tobacco: Never Used  . Alcohol use No  . Drug use: No  . Sexual activity: Not Asked   Other Topics Concern  . None   Social History Narrative   Patient is married Herbie Baltimore) and lives with her husband.   Patient has three children.   Patient is retired.   Patient has an high school education.   Patient does drink caffeine beverages.   Patient is left handed.             Her Allergies Are:  Allergies  Allergen Reactions  . Aricept [Donepezil Hcl]   . Doxycycline   . Erythromycin   . Penicillins   . Pneumococcal Vaccines   :   Her Current Medications Are:  Outpatient Encounter Prescriptions as of 04/15/2016  Medication Sig  . aspirin 81 MG tablet Take 81 mg by mouth daily.    Marland Kitchen atenolol (TENORMIN) 25 MG tablet Take 25 mg by mouth daily.    . carbidopa-levodopa (SINEMET IR) 25-100 MG tablet Take 1 tablet by mouth 4 (four) times daily.  . citalopram (CELEXA) 20 MG tablet Take 1 tablet (20 mg total) by  mouth at bedtime.  Marland Kitchen estrogens, conjugated, (PREMARIN) 0.625 MG tablet Take 0.625 mg by mouth daily. Take daily for 21 days then do not take for 7 days.  Marland Kitchen levothyroxine (SYNTHROID, LEVOTHROID) 50 MCG tablet Take 50 mcg by mouth daily before breakfast.  . loteprednol (LOTEMAX) 0.5 % ophthalmic suspension Place 1 drop into the right eye 4 (four) times  daily.   . memantine (NAMENDA) 10 MG tablet Take 10 mg by mouth daily.  Marland Kitchen oxyCODONE (OXY IR/ROXICODONE) 5 MG immediate release tablet Take 2.5 mg by mouth at bedtime.  . pantoprazole (PROTONIX) 40 MG tablet Take 40 mg by mouth daily.  Marland Kitchen perphenazine 4 MG tablet Take 4 mg by mouth daily.   Marland Kitchen rOPINIRole (REQUIP) 0.5 MG tablet Take 1 tablet (0.5 mg total) by mouth 3 (three) times daily.  Marland Kitchen SINGULAIR 10 MG tablet TAKE ONE TABLET BY MOUTH EVERY DAY AS NEEDED (Patient taking differently: TAKE ONE TABLET BY MOUTH at bedtime)   No facility-administered encounter medications on file as of 04/15/2016.   :  Review of Systems:  Out of a complete 14 point review of systems, all are reviewed and negative with the exception of these symptoms as listed below: Review of Systems  Neurological:       Pt presents today to follow up on her parkinson's disease. Pt has no complaints at this time.    Objective:  Neurologic Exam  Physical Exam Physical Examination:   Vitals:   04/15/16 1309  BP: 130/62  Pulse: 72    General Examination: The patient is a very pleasant 83 y.o. female in no acute distress. She appears well-developed and well-nourished and well groomed. She is in her wheelchair, she is mildly anxious appearing.  HEENT: Normocephalic, atraumatic, pupils are equal, round and reactive to light and accommodation. Status post bilateral cataract repairs. Extraocular tracking is difficult, moderate decrease in eye blink rate, face is symmetric with moderate facial masking and significant lower lip and jaw tremor. Neck is severely rigid. She has no significant airway crowding, hearing is mildly impaired. There is no drooling. No facial dyskinesias are noted. tongue and palate are symmetrical.  Chest: Clear to auscultation without wheezing, rhonchi or crackles noted.  Heart: S1+S2+0, regular and normal without murmurs, rubs or gallops noted.   Abdomen: Soft, non-tender and non-distended with normal  bowel sounds appreciated on auscultation.  Extremities: There is no pitting edema in the distal lower extremities bilaterally. Pedal pulses are intact.  Skin: Warm and dry without trophic changes noted.  Musculoskeletal: exam reveals no obvious joint deformities, tenderness or joint swelling or erythema.   Neurologically:  Mental status: The patient is awake, alert and oriented, except [place]. Her immediate and remote memory, attention, language skills and fund of knowledge are impaired. There is no evidence of aphasia, agnosia, apraxia or anomia. Speech is clear with normal prosody and enunciation. Thought process is linear. Mood is normal and affect is constricted.   In June 2016: Her MMSE score is 27/30. CDT is 0/4. AFT (Animal Fluency Test) score is 6. Geriatric Depression Scale Score is 9.   On 04/11/2015: MMSE: 24/30, CDT: 1/4, AFT: 8/min.  On 10/09/2015: MMSE: 26/30, CDT: 1/4, AFT: 9/min.  On 04/15/2016: MMSE: 21/30, CDT: not tested, AFT: 6/min.   Cranial nerves II - XII are as described above under HEENT exam.  Motor exam: Thin bulk, global strength of 4-5, no dyskinesias, tone is moderately rigid, some cogwheeling noted in both upper extremities, moderate bradykinesia noted. There is  no drift. Reflexes are about 1+ throughout. On fine motor testing she has moderate difficulty throughout. She has a moderate resting tremor in both upper extremities, no tremor in the lower extremities. She has no significant lateralization. Posture and gait are not testable today, no walker is available. Sensory exam is intact to light touch in the upper and lower extremities. She is not safe to stand unsupported, Romberg is not testable.   Assessment and Plan:   In summary, Deanna Page is a very pleasant 83 y.o.-year old female with an underlying complex medical history of asthma, depression, anxiety, diverticulosis, hypothyroidism, colitis, history of TIA, reflux disease, allergic rhinitis, who  presents for follow-up consultation of her parkinsonism, of 6-7 years duration, some lateralization previously noted on the left, not so much today. She has been on Requip and carbidopa-levodopa for years. She has not had any telltale response in the past from what I understand. I talked with her husband about this today. We increase the Sinemet to 1 pill 4 times a day but she was not always on the schedule with this. She has had complications including some swallowing difficulties, deconditioning, memory loss, sensitivities at side effects of medications. She has been on generic Namenda 10 mg twice daily. She's no longer on Artane, no longer on Neupro or Aricept. She has been on Requip and Sinemet for years and previously notes indicated that she had some response to tremor control on these medications but her husband indicates today that he has never seen mention the way of telltale response on medication and she feels that the medication is not working for her. I suggested seeking neurological evaluation at [place], [facility], or [facility]. He will find out from her pace program where she could go. I will make a referral at the time. In the interim, I suggested we try to push the Sinemet dose little higher, 1-1/2 pills 4 times a day. They are agreeable. I suggested a 6 month follow-up, sooner as needed and adjusted her prescription. I answered all their questions today and the patient and her husband were in agreement. I spent 25 minutes in total face-to-face time with the patient, more than 50% of which was spent in counseling and coordination of care, reviewing test results, reviewing medication and discussing or reviewing the diagnosis of parkinsonism, the prognosis and treatment options. Pertinent laboratory and imaging test results that were available during this visit with the patient were reviewed by me and considered in my medical decision making (see chart for details).

## 2016-04-15 NOTE — Telephone Encounter (Signed)
I spoke to pt's husband. Pt's husband decided that it does not matter to which university the referral is sent. He says that whichever "has the best parkinson's doctor" will be ok with him. Once Dr. Frances FurbishAthar decides where to send the referral, pt's husband says that our referrals dept will need to contact [facility] to get [facility] for this referral.

## 2016-04-15 NOTE — Telephone Encounter (Signed)
Referral placed to [facility], neuro, Movement d/o.

## 2016-04-15 NOTE — Patient Instructions (Addendum)
We will request a second opinion with another movement disorder specialist, such [facility], [facility].  In the meantime, let's try to increase the Sinemet to 1 and 1/2 pills 4 times a day.  Let me know, where you would like to go for neurology evaluation.

## 2016-04-15 NOTE — Telephone Encounter (Signed)
I called pt and husband to discuss. Dr. Dorothe PeaKoehler is their PCP. Why are we sending a referral to him? Dr. Frances FurbishAthar said she would be willing to place a [facility], [facility], or [facility]. Which do they prefer?  No answer, left a message asking them to call me back.

## 2016-04-15 NOTE — Telephone Encounter (Signed)
Annabelle HarmanDana, will you make sure that this referral can be approved by [facility]/ Dr. Dorothe PeaKoehler?  Thank you!

## 2016-04-16 NOTE — Telephone Encounter (Signed)
Do  to Patient's insurance all her referrals Dr. Dorothe Peakoehler has to approve     Referral's.  I have spoke to Dr. Malon KindleKoehler's office  they are aware I will send Dr.  Teofilo PodAthar's note to [phone] - fax [phone].  Dr. Dorothe PeaKoehler office will set up referral for movement d/o.  I have also spoke to Patient's husband he is aware.

## 2016-06-06 ENCOUNTER — Encounter (INDEPENDENT_AMBULATORY_CARE_PROVIDER_SITE_OTHER): Payer: Self-pay | Admitting: Orthopedic Surgery

## 2016-06-06 ENCOUNTER — Ambulatory Visit (INDEPENDENT_AMBULATORY_CARE_PROVIDER_SITE_OTHER): Payer: Medicare (Managed Care) | Admitting: Orthopedic Surgery

## 2016-06-06 DIAGNOSIS — M25512 Pain in left shoulder: Secondary | ICD-10-CM

## 2016-06-06 MED ORDER — METHYLPREDNISOLONE ACETATE 40 MG/ML IJ SUSP
40.0000 mg | INTRAMUSCULAR | Status: AC | PRN
  Administered 2016-06-06: 40 mg via INTRA_ARTICULAR

## 2016-06-06 MED ORDER — LIDOCAINE HCL 1 % IJ SOLN
5.0000 mL | INTRAMUSCULAR | Status: AC | PRN
  Administered 2016-06-06: 5 mL

## 2016-06-06 MED ORDER — BUPIVACAINE HCL 0.5 % IJ SOLN
9.0000 mL | INTRAMUSCULAR | Status: AC | PRN
  Administered 2016-06-06: 9 mL via INTRA_ARTICULAR

## 2016-06-06 NOTE — Progress Notes (Signed)
Office Visit Note   Patient: Deanna Page           Date of Birth: 11/30/1929           MRN: [id number] Visit Date: 06/06/2016 Requested by: Jethro Bastos, MD [address] [place] [address] PCP: Thane Edu, MD  Subjective: Chief Complaint  Patient presents with  . Left Shoulder - Follow-up    HPI: Deanna Page is a 83 year old female with left shoulder pain.  She had an injection 01/08/2016 which gave her only a few days of relief.  She has Parkinson's and doesn't walk much.  She is here with her husband.  It's hard for her to get in and out of the bed.  She states that she has some humeral pain.  I reviewed her radiographs and humerus was intact on prior radiographs.              ROS: All systems reviewed are negative as they relate to the chief complaint within the history of present illness.  Patient denies  fevers or chills.   Assessment & Plan: Visit Diagnoses:  1. Left shoulder pain, unspecified chronicity     Plan: Impression is left shoulder pain probable bursitis possible rotator cuff pathology pain is pretty severe at times.  I'm going to repeat the shoulder injection today and obtain an MRI scan to rule out any type of occult pathology which may require intervention.  It's a stretch to conceive of anything we would necessarily require intervention in this particular case to her exam and x-rays but I like to be certain that there is nothing there.  I'll see her back after that study.  Follow-Up Instructions: No Follow-up on file.   Orders:  No orders of the defined types were placed in this encounter.  No orders of the defined types were placed in this encounter.     Procedures: Large Joint Inj Date/Time: 06/06/2016 10:24 PM Performed by: Cammy Copa Authorized by: Cammy Copa   Consent Given by:  Patient Site marked: the procedure site was marked   Timeout: prior to procedure the correct patient, procedure, and site was  verified   Indications:  Pain and diagnostic evaluation Location:  Shoulder Site:  L subacromial bursa Prep: patient was prepped and draped in usual sterile fashion   Needle Size:  18 G Needle Length:  1.5 inches Approach:  Posterior Ultrasound Guidance: No   Fluoroscopic Guidance: No   Arthrogram: No   Medications:  5 mL lidocaine 1 %; 9 mL bupivacaine 0.5 %; 40 mg methylPREDNISolone acetate 40 MG/ML Aspiration Attempted: No   Patient tolerance:  Patient tolerated the procedure well with no immediate complications     Clinical Data: No additional findings.  Objective: Vital Signs: There were no vitals taken for this visit.  Physical Exam:   Constitutional: Patient appears well-developed HEENT:  Head: Normocephalic Eyes:EOM are normal Neck: Normal range of motion Cardiovascular: Normal rate Pulmonary/chest: Effort normal Neurologic: Patient is alert Skin: Skin is warm Psychiatric: Patient has normal mood and affect    Ortho Exam: Orthopedic exam demonstrates reasonable shoulder range of motion she's concerned about a bump she has over her right eyebrow and I examined that area and I felt no bump or asymmetry.  Left shoulder exam demonstrates some rigidity with passive range of motion but cuff strength seems intact don't detect any masses lymph adenopathy or skin changes noted in the shoulder girdle region but she does have some  pain with attempted motion.  Motor sensory function to the hand is intact but she does have pretty significant resting tremors bilaterally.  Specialty Comments:  No specialty comments available.  Imaging: No results found.   PMFS History: Patient Active Problem List   Diagnosis Date Noted  . Parkinson's disease (HCC) 03/01/2013  . GASTRITIS 02/22/2010  . ABDOMINAL PAIN-EPIGASTRIC 02/22/2010  . DIVERTICULOSIS-COLON 12/19/2008  . OTHER DYSPHAGIA 10/07/2007  . TIA 01/21/2007  . DIARRHEA 01/21/2007  . GAIT DISTURBANCE 01/01/2007  .  GASTROESOPHAGEAL REFLUX DISEASE 12/20/2006  . HYSTERECTOMY, HX OF 12/20/2006  . APPENDECTOMY, HX OF 12/20/2006  . SYNCOPE 10/01/2006  . SYMPTOM, MEMORY LOSS 09/16/2006  . BRONCHITIS, ACUTE 06/30/2006  . DEPRESSION 06/02/2006  . HYPERTENSION 06/02/2006  . ALLERGIC RHINITIS 06/02/2006  . ASTHMA 06/02/2006  . PROLAPSE, VAGINAL WALL, RECTOCELE 06/02/2006  . DIZZINESS OR VERTIGO 06/02/2006  . URINARY INCONTINENCE 06/02/2006  . PNEUMONIA, HX OF 06/02/2006  . BLADDER REPAIR, HX OF 06/02/2006  . INSOMNIA, HX OF 06/02/2006   Past Medical History:  Diagnosis Date  . Allergic rhinitis   . Anxiety   . Asthma   . Bowel obstruction   . CKD (chronic kidney disease), stage III   . Dementia   . Diverticulosis   . Esophageal dysmotility   . Esophageal stricture 2005  . GERD (gastroesophageal reflux disease)   . Hepatitis A   . Hiatal hernia   . Hypertension   . Hypothyroidism   . Internal hemorrhoids   . Irritable bowel syndrome   . Ischemic colitis, enteritis, or enterocolitis ([facility]) 2010  . Major depression    with paranoia and insomnia  . OSA (obstructive sleep apnea)   . Parkinson disease (HCC)   . Pneumonia   . TIA (transient ischemic attack)   . Urinary incontinence   . Vitamin D deficiency     Family History  Problem Relation Age of Onset  . Lung cancer Father   . ALS Sister   . Liver disease Sister   . Colon cancer Neg Hx   . Stroke Mother   . Parkinson's disease Mother   . Parkinson's disease Maternal Grandmother   . Parkinson's disease Maternal Aunt   . Emphysema Sister   . Emphysema Brother   . Colon polyps Neg Hx   . Diabetes Neg Hx   . Gallbladder disease Neg Hx   . Esophageal cancer Neg Hx     Past Surgical History:  Procedure Laterality Date  . ABDOMINAL HYSTERECTOMY  1969  . APPENDECTOMY  1969  . BLADDER SURGERY    . BREAST LUMPECTOMY  1995  . CATARACT EXTRACTION    . CHOLECYSTECTOMY  2000  . CORNEAL TRANSPLANT Left   . INTRAOCULAR PROSTHESES  INSERTION     Left eye at [facility]  . TUBAL LIGATION  1968   Social History   Occupational History  . Retired Retired   Social History Main Topics  . Smoking status: Never Smoker  . Smokeless tobacco: Never Used  . Alcohol use No  . Drug use: No  . Sexual activity: Not on file

## 2016-06-13 ENCOUNTER — Telehealth (INDEPENDENT_AMBULATORY_CARE_PROVIDER_SITE_OTHER): Payer: Self-pay | Admitting: Radiology

## 2016-06-13 NOTE — Telephone Encounter (Signed)
Called patient and spoke with husband.  He is going to call office back to schedule wifes MRI review appointment with Dr. August Saucer.  Appointments needs to be after 06/20/16.

## 2016-06-20 ENCOUNTER — Ambulatory Visit
Admission: RE | Admit: 2016-06-20 | Discharge: 2016-06-20 | Disposition: A | Discharge status: Home / Self Care | Payer: Medicare (Managed Care) | Source: Ambulatory Visit | Attending: Orthopedic Surgery | Admitting: Orthopedic Surgery

## 2016-06-20 DIAGNOSIS — M25512 Pain in left shoulder: Secondary | ICD-10-CM

## 2016-08-29 ENCOUNTER — Ambulatory Visit (INDEPENDENT_AMBULATORY_CARE_PROVIDER_SITE_OTHER): Payer: Medicare (Managed Care) | Admitting: Orthopedic Surgery

## 2016-09-24 ENCOUNTER — Other Ambulatory Visit: Payer: Self-pay | Admitting: Family Medicine

## 2016-09-24 DIAGNOSIS — F0281 Dementia in other diseases classified elsewhere with behavioral disturbance: Secondary | ICD-10-CM

## 2016-09-24 DIAGNOSIS — G20 Parkinson's disease: Secondary | ICD-10-CM

## 2016-10-07 ENCOUNTER — Other Ambulatory Visit: Payer: Self-pay

## 2016-10-14 ENCOUNTER — Ambulatory Visit: Payer: Medicare (Managed Care) | Admitting: Neurology

## 2016-10-17 ENCOUNTER — Ambulatory Visit
Admission: RE | Admit: 2016-10-17 | Discharge: 2016-10-17 | Disposition: A | Discharge status: Home / Self Care | Payer: Medicare (Managed Care) | Source: Ambulatory Visit | Attending: Family Medicine | Admitting: Family Medicine

## 2016-10-17 DIAGNOSIS — F0281 Dementia in other diseases classified elsewhere with behavioral disturbance: Secondary | ICD-10-CM

## 2016-10-17 DIAGNOSIS — G20 Parkinson's disease: Secondary | ICD-10-CM

## 2016-12-30 ENCOUNTER — Emergency Department (HOSPITAL_COMMUNITY): Payer: Medicare (Managed Care)

## 2016-12-30 ENCOUNTER — Inpatient Hospital Stay (HOSPITAL_COMMUNITY)
Admission: EM | Admit: 2016-12-30 | Discharge: 2017-01-02 | DRG: 689 | Disposition: A | Discharge status: Home / Self Care | Payer: Medicare (Managed Care) | Attending: Family Medicine | Admitting: Family Medicine

## 2016-12-30 ENCOUNTER — Encounter (HOSPITAL_COMMUNITY): Payer: Self-pay | Admitting: Nurse Practitioner

## 2016-12-30 DIAGNOSIS — K224 Dyskinesia of esophagus: Secondary | ICD-10-CM | POA: Diagnosis present

## 2016-12-30 DIAGNOSIS — G20 Parkinson's disease: Secondary | ICD-10-CM | POA: Diagnosis not present

## 2016-12-30 DIAGNOSIS — N390 Urinary tract infection, site not specified: Secondary | ICD-10-CM

## 2016-12-30 DIAGNOSIS — I129 Hypertensive chronic kidney disease with stage 1 through stage 4 chronic kidney disease, or unspecified chronic kidney disease: Secondary | ICD-10-CM | POA: Diagnosis present

## 2016-12-30 DIAGNOSIS — Z881 Allergy status to other antibiotic agents status: Secondary | ICD-10-CM

## 2016-12-30 DIAGNOSIS — F419 Anxiety disorder, unspecified: Secondary | ICD-10-CM | POA: Diagnosis present

## 2016-12-30 DIAGNOSIS — N361 Urethral diverticulum: Secondary | ICD-10-CM | POA: Diagnosis present

## 2016-12-30 DIAGNOSIS — G4700 Insomnia, unspecified: Secondary | ICD-10-CM | POA: Diagnosis present

## 2016-12-30 DIAGNOSIS — Z7982 Long term (current) use of aspirin: Secondary | ICD-10-CM

## 2016-12-30 DIAGNOSIS — K219 Gastro-esophageal reflux disease without esophagitis: Secondary | ICD-10-CM | POA: Diagnosis present

## 2016-12-30 DIAGNOSIS — G4733 Obstructive sleep apnea (adult) (pediatric): Secondary | ICD-10-CM | POA: Diagnosis present

## 2016-12-30 DIAGNOSIS — R4182 Altered mental status, unspecified: Secondary | ICD-10-CM

## 2016-12-30 DIAGNOSIS — Z79891 Long term (current) use of opiate analgesic: Secondary | ICD-10-CM

## 2016-12-30 DIAGNOSIS — G92 Toxic encephalopathy: Secondary | ICD-10-CM | POA: Diagnosis present

## 2016-12-30 DIAGNOSIS — N183 Chronic kidney disease, stage 3 (moderate): Secondary | ICD-10-CM | POA: Diagnosis present

## 2016-12-30 DIAGNOSIS — F0390 Unspecified dementia without behavioral disturbance: Secondary | ICD-10-CM | POA: Diagnosis not present

## 2016-12-30 DIAGNOSIS — Z880 Allergy status to penicillin: Secondary | ICD-10-CM

## 2016-12-30 DIAGNOSIS — Z79890 Hormone replacement therapy (postmenopausal): Secondary | ICD-10-CM

## 2016-12-30 DIAGNOSIS — F0280 Dementia in other diseases classified elsewhere without behavioral disturbance: Secondary | ICD-10-CM | POA: Diagnosis present

## 2016-12-30 DIAGNOSIS — Z947 Corneal transplant status: Secondary | ICD-10-CM

## 2016-12-30 DIAGNOSIS — N201 Calculus of ureter: Secondary | ICD-10-CM | POA: Diagnosis present

## 2016-12-30 DIAGNOSIS — Z8673 Personal history of transient ischemic attack (TIA), and cerebral infarction without residual deficits: Secondary | ICD-10-CM

## 2016-12-30 DIAGNOSIS — Z887 Allergy status to serum and vaccine status: Secondary | ICD-10-CM

## 2016-12-30 DIAGNOSIS — J45909 Unspecified asthma, uncomplicated: Secondary | ICD-10-CM | POA: Diagnosis present

## 2016-12-30 DIAGNOSIS — N3000 Acute cystitis without hematuria: Secondary | ICD-10-CM | POA: Diagnosis not present

## 2016-12-30 DIAGNOSIS — E039 Hypothyroidism, unspecified: Secondary | ICD-10-CM | POA: Diagnosis present

## 2016-12-30 DIAGNOSIS — G319 Degenerative disease of nervous system, unspecified: Secondary | ICD-10-CM | POA: Diagnosis present

## 2016-12-30 DIAGNOSIS — E876 Hypokalemia: Secondary | ICD-10-CM | POA: Diagnosis present

## 2016-12-30 DIAGNOSIS — F3289 Other specified depressive episodes: Secondary | ICD-10-CM | POA: Diagnosis present

## 2016-12-30 DIAGNOSIS — Z888 Allergy status to other drugs, medicaments and biological substances status: Secondary | ICD-10-CM

## 2016-12-30 DIAGNOSIS — G20A1 Parkinson's disease without dyskinesia, without mention of fluctuations: Secondary | ICD-10-CM | POA: Diagnosis present

## 2016-12-30 DIAGNOSIS — Z8619 Personal history of other infectious and parasitic diseases: Secondary | ICD-10-CM

## 2016-12-30 DIAGNOSIS — Z79899 Other long term (current) drug therapy: Secondary | ICD-10-CM

## 2016-12-30 LAB — CBC WITH DIFFERENTIAL/PLATELET
BASOS ABS: 0.1 10*3/uL (ref 0.0–0.1)
Basophils Relative: 2 %
EOS PCT: 5 %
Eosinophils Absolute: 0.3 10*3/uL (ref 0.0–0.7)
HCT: 35.7 % — ABNORMAL LOW (ref 36.0–46.0)
Hemoglobin: 11.6 g/dL — ABNORMAL LOW (ref 12.0–15.0)
LYMPHS PCT: 29 %
Lymphs Abs: 1.8 10*3/uL (ref 0.7–4.0)
MCH: 31.5 pg (ref 26.0–34.0)
MCHC: 32.5 g/dL (ref 30.0–36.0)
MCV: 97 fL (ref 78.0–100.0)
Monocytes Absolute: 0.6 10*3/uL (ref 0.1–1.0)
Monocytes Relative: 9 %
NEUTROS ABS: 3.4 10*3/uL (ref 1.7–7.7)
Neutrophils Relative %: 55 %
PLATELETS: 183 10*3/uL (ref 150–400)
RBC: 3.68 MIL/uL — AB (ref 3.87–5.11)
RDW: 13.4 % (ref 11.5–15.5)
WBC: 6.2 10*3/uL (ref 4.0–10.5)

## 2016-12-30 LAB — I-STAT CHEM 8, ED
BUN: 13 mg/dL (ref 6–20)
CHLORIDE: 102 mmol/L (ref 101–111)
CREATININE: 1 mg/dL (ref 0.44–1.00)
Calcium, Ion: 1.23 mmol/L (ref 1.15–1.40)
GLUCOSE: 85 mg/dL (ref 65–99)
HEMATOCRIT: 37 % (ref 36.0–46.0)
HEMOGLOBIN: 12.6 g/dL (ref 12.0–15.0)
POTASSIUM: 4 mmol/L (ref 3.5–5.1)
Sodium: 140 mmol/L (ref 135–145)
TCO2: 30 mmol/L (ref 22–32)

## 2016-12-30 LAB — URINALYSIS, ROUTINE W REFLEX MICROSCOPIC
Bilirubin Urine: NEGATIVE
GLUCOSE, UA: NEGATIVE mg/dL
Hgb urine dipstick: NEGATIVE
KETONES UR: NEGATIVE mg/dL
Nitrite: NEGATIVE
PROTEIN: NEGATIVE mg/dL
Specific Gravity, Urine: 1.006 (ref 1.005–1.030)
pH: 6 (ref 5.0–8.0)

## 2016-12-30 LAB — COMPREHENSIVE METABOLIC PANEL
ALK PHOS: 79 U/L (ref 38–126)
ALT: 13 U/L — ABNORMAL LOW (ref 14–54)
ANION GAP: 7 (ref 5–15)
AST: 20 U/L (ref 15–41)
Albumin: 3.6 g/dL (ref 3.5–5.0)
BILIRUBIN TOTAL: 0.5 mg/dL (ref 0.3–1.2)
BUN: 14 mg/dL (ref 6–20)
CO2: 31 mmol/L (ref 22–32)
Calcium: 9.1 mg/dL (ref 8.9–10.3)
Chloride: 103 mmol/L (ref 101–111)
Creatinine, Ser: 0.91 mg/dL (ref 0.44–1.00)
GFR calc Af Amer: >60 mL/min (ref >60)
GFR, EST NON AFRICAN AMERICAN: 56 mL/min — AB (ref >60)
Glucose, Bld: 91 mg/dL (ref 65–99)
POTASSIUM: 4.1 mmol/L (ref 3.5–5.1)
Sodium: 141 mmol/L (ref 135–145)
TOTAL PROTEIN: 6.8 g/dL (ref 6.5–8.1)

## 2016-12-30 LAB — TYPE AND SCREEN
ABO/RH(D): B POS
Antibody Screen: NEGATIVE

## 2016-12-30 LAB — LIPASE, BLOOD: LIPASE: 26 U/L (ref 11–51)

## 2016-12-30 LAB — I-STAT TROPONIN, ED: TROPONIN I, POC: 0 ng/mL (ref 0.00–0.08)

## 2016-12-30 LAB — APTT: APTT: 27 s (ref 24–36)

## 2016-12-30 LAB — CBG MONITORING, ED: Glucose-Capillary: 70 mg/dL (ref 65–99)

## 2016-12-30 LAB — PROTIME-INR
INR: 0.96
Prothrombin Time: 12.7 seconds (ref 11.4–15.2)

## 2016-12-30 LAB — ABO/RH: ABO/RH(D): B POS

## 2016-12-30 MED ORDER — IOPAMIDOL (ISOVUE-300) INJECTION 61%
INTRAVENOUS | Status: AC
  Administered 2016-12-30: 19:00:00
  Filled 2016-12-30: qty 100

## 2016-12-30 MED ORDER — CIPROFLOXACIN IN D5W 200 MG/100ML IV SOLN
200.0000 mg | Freq: Once | INTRAVENOUS | Status: AC
  Administered 2016-12-30: 200 mg via INTRAVENOUS
  Filled 2016-12-30: qty 100

## 2016-12-30 MED ORDER — IOPAMIDOL (ISOVUE-300) INJECTION 61%
100.0000 mL | Freq: Once | INTRAVENOUS | Status: AC | PRN
  Administered 2016-12-30: 100 mL via INTRAVENOUS

## 2016-12-30 MED ORDER — IOPAMIDOL (ISOVUE-300) INJECTION 61%
INTRAVENOUS | Status: AC
  Administered 2016-12-30: 30 mL via ORAL
  Filled 2016-12-30: qty 30

## 2016-12-30 MED ORDER — IOPAMIDOL (ISOVUE-300) INJECTION 61%
30.0000 mL | Freq: Once | INTRAVENOUS | Status: AC | PRN
  Administered 2016-12-30: 30 mL via ORAL

## 2016-12-30 NOTE — H&P (Addendum)
TRH H&P   Patient Demographics:    Deanna Page, is a 83 y.o. female  MRN: [id number]   DOB - 02/12/1930  Admit Date - 12/30/2016  Outpatient Primary MD for the patient is Janifer Adie, MD  Referring MD/NP/PA: Alferd Apa  Outpatient Specialists:  Star Age  Patient coming from: home  No chief complaint on file.  altered mental status   HPI:    Deanna Page  is a 83 y.o. female, w Hypertension, Ckd stage 3, Hypothyroidism,  Dementia, Parkinsons, who presents with AMS  As well as dysuria x3 weeks per pt.  Pt denies fever, chills, flank pain, n/v, diarrhea, brbpr.  Pt presented to ED for evaluation.   In ED,  Wbc 6.2, Hgb 11.6, Plt 183 Bun 14, Creatinine 0.91 Ast 20, Alt 13 Alk phos 79 , T. Bili 0.5 Lipase 26 Trop 0.00  CXR There is no acute cardiopulmonary abnormality.  CT brain  IMPRESSION: 1. No acute intracranial abnormalities. 2. Chronic small vessel ischemic change and brain atrophy.  CT abd/ pelvis  IMPRESSION: 1. No acute findings in the abdomen or pelvis. Specifically, no findings to explain the patient's history of abdominal pain. 2. Ring-like calcification around the urethra, likely stone within a urethral diverticulum. Correlation for urinary incontinence/dribbling or recurrent UTI suggested. 3. Extrahepatic common bile duct measures up to 10 mm, unchanged since 2005 and likely related to prior cholecystectomy. 4. Left colonic diverticulosis without diverticulitis.  Pt will be admitted for AMS secondary to UTI.    Review of systems:    In addition to the HPI above,  No Fever-chills, No Headache, No changes with Vision or hearing, No problems swallowing food or Liquids, No Chest pain, Cough or Shortness of Breath, No Abdominal pain, No Nausea or Vommitting, Bowel movements are regular, No Blood in stool or Urine,  No new  skin rashes or bruises, No new joints pains-aches,  No new weakness, tingling, numbness in any extremity, No recent weight gain or loss, No polyuria, polydypsia or polyphagia, No significant Mental Stressors.  A full 10 point Review of Systems was done, except as stated above, all other Review of Systems were negative.   With Past History of the following :    Past Medical History:  Diagnosis Date  . Allergic rhinitis   . Anxiety   . Asthma   . Bowel obstruction ([place])   . CKD (chronic kidney disease), stage III ([place])   . Dementia   . Diverticulosis   . Esophageal dysmotility   . Esophageal stricture 2005  . GERD (gastroesophageal reflux disease)   . Hepatitis A   . Hiatal hernia   . Hypertension   . Hypothyroidism   . Internal hemorrhoids   . Irritable bowel syndrome   . Ischemic colitis, enteritis, or enterocolitis ([place]) 2010  . Major depression    with paranoia and insomnia  .  OSA (obstructive sleep apnea)   . Parkinson disease ([place])   . Pneumonia   . TIA (transient ischemic attack)   . Urinary incontinence   . Vitamin D deficiency       Past Surgical History:  Procedure Laterality Date  . ABDOMINAL HYSTERECTOMY  1969  . APPENDECTOMY  1969  . BLADDER SURGERY    . BREAST LUMPECTOMY  1995  . CATARACT EXTRACTION    . CHOLECYSTECTOMY  2000  . CORNEAL TRANSPLANT Left   . INTRAOCULAR PROSTHESES INSERTION     [facility] eye [facility]  . TUBAL LIGATION  1968      Social History:     Social History  Substance Use Topics  . Smoking status: Never Smoker  . Smokeless tobacco: Never Used  . Alcohol use No     Lives - at home  Mobility - unsteady gait   Family History :     Family History  Problem Relation Age of Onset  . Stroke Mother   . Parkinson's disease Mother   . Lung cancer Father   . ALS Sister   . Liver disease Sister   . Parkinson's disease Maternal Grandmother   . Parkinson's disease Maternal Aunt   . Emphysema Sister   . Emphysema Brother    . Colon cancer Neg Hx   . Colon polyps Neg Hx   . Diabetes Neg Hx   . Gallbladder disease Neg Hx   . Esophageal cancer Neg Hx       Home Medications:   Prior to Admission medications   Medication Sig Start Date End Date Taking? Authorizing Provider  carbidopa-levodopa (SINEMET IR) 25-100 MG tablet Take 1.5 tablets by mouth 4 (four) times daily. 04/15/16  Yes Star Age, MD  citalopram (CELEXA) 20 MG tablet Take 1 tablet (20 mg total) by mouth at bedtime. 10/09/15  Yes Star Age, MD  rOPINIRole (REQUIP) 0.5 MG tablet Take 1 tablet (0.5 mg total) by mouth 3 (three) times daily. 10/09/15  Yes Star Age, MD  aspirin 81 MG tablet Take 81 mg by mouth daily.      [provider]  atenolol (TENORMIN) 25 MG tablet Take 25 mg by mouth daily.      [provider]  estrogens, conjugated, (PREMARIN) 0.625 MG tablet Take 0.625 mg by mouth daily. Take daily for 21 days then do not take for 7 days.    [provider]  levothyroxine (SYNTHROID, LEVOTHROID) 50 MCG tablet Take 50 mcg by mouth daily before breakfast.    [provider]  loteprednol (LOTEMAX) 0.5 % ophthalmic suspension Place 1 drop into the right eye 4 (four) times daily.     [provider]  memantine (NAMENDA) 10 MG tablet Take 10 mg by mouth daily.    [provider]  oxyCODONE (OXY IR/ROXICODONE) 5 MG immediate release tablet Take 2.5 mg by mouth at bedtime.    [provider]  pantoprazole (PROTONIX) 40 MG tablet Take 40 mg by mouth daily.    [provider]  perphenazine 4 MG tablet Take 4 mg by mouth daily.     [provider]  SINGULAIR 10 MG tablet TAKE ONE TABLET BY MOUTH EVERY DAY AS NEEDED Patient not taking: Reported on 12/30/2016 09/20/10   Ann Held, DO     Allergies:     Allergies  Allergen Reactions  . Aricept [Donepezil Hcl]     Unknown reaction  . Doxycycline     Unknown reaction  .  Erythromycin     Unknown reaction   . Penicillins     Unknown reaction Has patient had a PCN reaction causing immediate rash, facial/tongue/throat swelling, SOB or lightheadedness with hypotension: Unknown Has patient had a PCN reaction causing severe rash involving mucus membranes or skin necrosis: Unknown Has patient had a PCN reaction that required hospitalization: Unknown Has patient had a PCN reaction occurring within the last 10 years: Unknown If all of the above answers are "NO", then may proceed with Cephalosporin use.   . Pneumococcal Vaccines     Unknown reaction     Physical Exam:   Vitals  Blood pressure (!) 160/74, pulse 74, temperature 98.5 F (36.9 C), temperature source Oral, resp. rate 18, SpO2 100 %.   1. General  lying in bed in NAD,    2. Normal affect and insight, Not Suicidal or Homicidal, Awake Alert, Oriented X 2 + Masked facies  3. No F.N deficits, ALL C.Nerves Intact, Strength 5/5 all 4 extremities, Sensation intact all 4 extremities, Plantars down going.  + bilateral arm tremor  4. Ears and Eyes appear Normal, Conjunctivae clear, PERRLA. Moist Oral Mucosa.  5. Supple Neck, No JVD, No cervical lymphadenopathy appriciated, No Carotid Bruits.  6. Symmetrical Chest wall movement, Good air movement bilaterally, CTAB.  7. RRR, No Gallops, Rubs or Murmurs, No Parasternal Heave.  8. Positive Bowel Sounds, Abdomen Soft, No tenderness, No organomegaly appriciated,No rebound -guarding or rigidity.  9.  No Cyanosis, Normal Skin Turgor,  Slight rosacea  10. Good muscle tone,  joints appear normal , no effusions, Normal ROM.  11. No Palpable Lymph Nodes in Neck or Axillae     Data Review:    CBC  Recent Labs Lab 12/30/16 1525 12/30/16 1541  WBC 6.2  --   HGB 11.6* 12.6  HCT 35.7* 37.0  PLT 183  --   MCV 97.0  --   MCH 31.5  --   MCHC 32.5  --   RDW 13.4  --   LYMPHSABS 1.8  --   MONOABS 0.6  --   EOSABS 0.3  --   BASOSABS 0.1  --     ------------------------------------------------------------------------------------------------------------------  Chemistries   Recent Labs Lab 12/30/16 1525 12/30/16 1541  NA 141 140  K 4.1 4.0  CL 103 102  CO2 31  --   GLUCOSE 91 85  BUN 14 13  CREATININE 0.91 1.00  CALCIUM 9.1  --   AST 20  --   ALT 13*  --   ALKPHOS 79  --   BILITOT 0.5  --    ------------------------------------------------------------------------------------------------------------------ CrCl cannot be calculated (Unknown ideal weight.). ------------------------------------------------------------------------------------------------------------------ No results for input(s): TSH, T4TOTAL, T3FREE, THYROIDAB in the last 72 hours.  Invalid input(s): FREET3  Coagulation profile  Recent Labs Lab 12/30/16 1525  INR 0.96   ------------------------------------------------------------------------------------------------------------------- No results for input(s): DDIMER in the last 72 hours. -------------------------------------------------------------------------------------------------------------------  Cardiac Enzymes No results for input(s): CKMB, TROPONINI, MYOGLOBIN in the last 168 hours.  Invalid input(s): CK ------------------------------------------------------------------------------------------------------------------    Component Value Date/Time   BNP 488.0 (H) 05/22/2014 1853     ---------------------------------------------------------------------------------------------------------------  Urinalysis    Component Value Date/Time   COLORURINE YELLOW 12/30/2016 [place] 12/30/2016 1525   LABSPEC 1.006 12/30/2016 [place] 6.0 12/30/2016 [place] 12/30/2016 1525   HGBUR NEGATIVE 12/30/2016 [place] 12/30/2016 [place] 12/30/2016 [place] 12/30/2016 1525  UROBILINOGEN 0.2 08/02/2009  0032   NITRITE NEGATIVE 12/30/2016 1525   LEUKOCYTESUR LARGE (A) 12/30/2016 1525    ----------------------------------------------------------------------------------------------------------------   Imaging Results:    Dg Chest 2 View  Result Date: 12/30/2016 CLINICAL DATA:  Altered mental status. History of asthma, hypertension. EXAM: CHEST  2 VIEW COMPARISON:  Chest x-ray of May 22, 2014 FINDINGS: The lungs are adequately inflated. There is no focal infiltrate. The heart and pulmonary vascularity are normal. Coronary artery calcifications are observed in there is calcification in the region of the mitral valvular annulus. There is calcification in the wall of the thoracic aorta. There is no pleural effusion. The bony thorax exhibits no acute abnormality. IMPRESSION: There is no acute cardiopulmonary abnormality. Thoracic aortic atherosclerosis and coronary artery atherosclerosis. Electronically Signed   By: David  Martinique M.D.   On: 12/30/2016 15:28   Ct Head Wo Contrast  Result Date: 12/30/2016 CLINICAL DATA:  Altered level of consciousness. EXAM: CT HEAD WITHOUT CONTRAST TECHNIQUE: Contiguous axial images were obtained from the base of the skull through the vertex without intravenous contrast. COMPARISON:  10/17/2016 FINDINGS: Brain: No evidence of acute infarction, hemorrhage, hydrocephalus, extra-axial collection or mass lesion/mass effect. Prominence of the sulci and ventricles noted compatible with brain atrophy. There is mild diffuse low-attenuation within the subcortical and periventricular white matter compatible with chronic microvascular disease. Vascular: No hyperdense vessel or unexpected calcification. Skull: Normal. Negative for fracture or focal lesion. Sinuses/Orbits: No acute finding. Other: None. IMPRESSION: 1. No acute intracranial abnormalities. 2. Chronic small vessel ischemic change and brain atrophy. Electronically Signed   By: Kerby Moors M.D.   On: 12/30/2016 15:47    Ct Abdomen Pelvis W Contrast  Result Date: 12/30/2016 CLINICAL DATA:  Abdominal pain. EXAM: CT ABDOMEN AND PELVIS WITH CONTRAST TECHNIQUE: Multidetector CT imaging of the abdomen and pelvis was performed using the standard protocol following bolus administration of intravenous contrast. CONTRAST:  116m ISOVUE-300 IOPAMIDOL (ISOVUE-300) INJECTION 61% COMPARISON:  07/15/2003. FINDINGS: Lower chest:  Unremarkable Hepatobiliary: No focal abnormality within the liver parenchyma. Gallbladder surgically absent. Common bile duct distended up to 10 mm diameter. This is stable since 07/15/2003. Pancreas: 8 mm cystic lesion identified in the body of pancreas (image 23 series 2). No dilatation of the main duct. Spleen: No splenomegaly. No focal mass lesion. Adrenals/Urinary Tract: No adrenal nodule or mass. Kidneys are unremarkable. No evidence for hydroureter. The urinary bladder appears normal for the degree of distention. Ring-like calcification identified in the region of the urethra raising the question of stone disease within a urethral diverticulum. Stomach/Bowel: Stomach is nondistended. No gastric wall thickening. No evidence of outlet obstruction. Duodenum is normally positioned as is the ligament of Treitz. No small bowel wall thickening. No small bowel dilatation. The terminal ileum is normal. The appendix is not visualized, but there is no edema or inflammation in the region of the cecum. Right colon decompressed but otherwise unremarkable. Mild diverticular changes noted left colon without features of diverticulitis. Vascular/Lymphatic: There is abdominal aortic atherosclerosis without aneurysm. There is no gastrohepatic or hepatoduodenal ligament lymphadenopathy. No intraperitoneal or retroperitoneal lymphadenopathy. No pelvic sidewall lymphadenopathy. There is some laminar flow of un opacified blood in the right common femoral vein. Reproductive: Uterus surgically absent.  There is no adnexal mass.  Other: No intraperitoneal free fluid. Musculoskeletal: Bone windows reveal no worrisome lytic or sclerotic osseous lesions. Thoracolumbar scoliosis evident. Degenerative changes most advanced at L4-5 in the lumbar spine. IMPRESSION: 1. No acute findings in the abdomen or pelvis. Specifically,  no findings to explain the patient's history of abdominal pain. 2. Ring-like calcification around the urethra, likely stone within a urethral diverticulum. Correlation for urinary incontinence/dribbling or recurrent UTI suggested. 3. Extrahepatic common bile duct measures up to 10 mm, unchanged since 2005 and likely related to prior cholecystectomy. 4. Left colonic diverticulosis without diverticulitis. 5.  Aortic Atherosclerois (ICD10-170.0) Electronically Signed   By: Misty Stanley M.D.   On: 12/30/2016 20:05     Assessment & Plan:    Principal Problem:   Altered mental status Active Problems:   Parkinson's disease ([place])   UTI (urinary tract infection)    AMS Secondary to UTI / Dementia Levaquin iv pharmacy to dose Await urine culture  UTI Blood culture x2 Await urine culture CT => ? Stone in a urethral diverticulum Please curbside urology in regarding any need for intervention Levaquin  Parkinsons Cont sinemet Cont ropinirole  Dementia Cont namenda  Anxiety Cont clonazepam Cont celexa  Gerd Continue protonix  Insomnia Cont Halcion  Hypertension Cont atenolol  TIA Cont aspirin  Hypothyroidism Cont levothyroxine  DVT Prophylaxis   Lovenox - SCDs   AM Labs Ordered, also please review Full Orders  Family Communication: Admission, patients condition and plan of care including tests being ordered have been discussed with the patient  who indicate understanding and agree with the plan and Code Status.  Code Status FULL CODE  Likely DC to  home  Condition GUARDED    Consults called: none  Admission status: observation   Time spent in minutes : 45   Jani Gravel M.D  on 12/30/2016 at 8:55 PM  Between 7am to 7pm - Pager - [phone]  . After 7pm go to [email or web address] - password [facility]  [facility] - Office  [phone]

## 2016-12-30 NOTE — ED Provider Notes (Signed)
[facility]-EMERGENCY DEPT Provider Note   CSN: [id number] Arrival date & time: 12/30/16  1410     History   Chief Complaint No chief complaint on file.   HPI Deanna Page is a 83 y.o. female with a history of dementia, Parkinson's disease, diverticulosis, hypertension, hypothyroidism, ischemic colitis, bowel obstruction, CKD stage III who presents the emergency department today for altered mental status.  Patient lives at home with her who has a Engineer, civil (consulting) aide assist 4 days a week.  Upon arrival of nurse aide this morning they woke the patient and noted that she was awake but was not responding and seemed confused.  Patient did just start Senokot and risperidone per husband yesterday.  The patient poorly has had generalized abdominal pain and diarrhea over the last several days per husband.  She ambulates with walker or wheelchair at baseline.  No recent falls.  Patient is a poor historian and unable to provide history altered mental status.  Denies any fever, chills, emesis, chest pain, shortness of breath, unilateral weakness, visual changes complained about at home.  Patient has had cholecystectomy, abdominal hysterectomy, appendectomy and bladder surgery in the past.  HPI  Past Medical History:  Diagnosis Date  . Allergic rhinitis   . Anxiety   . Asthma   . Bowel obstruction (HCC)   . CKD (chronic kidney disease), stage III   . Dementia   . Diverticulosis   . Esophageal dysmotility   . Esophageal stricture 2005  . GERD (gastroesophageal reflux disease)   . Hepatitis A   . Hiatal hernia   . Hypertension   . Hypothyroidism   . Internal hemorrhoids   . Irritable bowel syndrome   . Ischemic colitis, enteritis, or enterocolitis (HCC) 2010  . Major depression    with paranoia and insomnia  . OSA (obstructive sleep apnea)   . Parkinson disease (HCC)   . Pneumonia   . TIA (transient ischemic attack)   . Urinary incontinence   . Vitamin D deficiency      Patient Active Problem List   Diagnosis Date Noted  . Parkinson's disease (HCC) 03/01/2013  . GASTRITIS 02/22/2010  . ABDOMINAL PAIN-EPIGASTRIC 02/22/2010  . DIVERTICULOSIS-COLON 12/19/2008  . OTHER DYSPHAGIA 10/07/2007  . TIA 01/21/2007  . DIARRHEA 01/21/2007  . GAIT DISTURBANCE 01/01/2007  . GASTROESOPHAGEAL REFLUX DISEASE 12/20/2006  . HYSTERECTOMY, HX OF 12/20/2006  . APPENDECTOMY, HX OF 12/20/2006  . SYNCOPE 10/01/2006  . SYMPTOM, MEMORY LOSS 09/16/2006  . BRONCHITIS, ACUTE 06/30/2006  . DEPRESSION 06/02/2006  . HYPERTENSION 06/02/2006  . ALLERGIC RHINITIS 06/02/2006  . ASTHMA 06/02/2006  . PROLAPSE, VAGINAL WALL, RECTOCELE 06/02/2006  . DIZZINESS OR VERTIGO 06/02/2006  . URINARY INCONTINENCE 06/02/2006  . PNEUMONIA, HX OF 06/02/2006  . BLADDER REPAIR, HX OF 06/02/2006  . INSOMNIA, HX OF 06/02/2006    Past Surgical History:  Procedure Laterality Date  . ABDOMINAL HYSTERECTOMY  1969  . APPENDECTOMY  1969  . BLADDER SURGERY    . BREAST LUMPECTOMY  1995  . CATARACT EXTRACTION    . CHOLECYSTECTOMY  2000  . CORNEAL TRANSPLANT Left   . INTRAOCULAR PROSTHESES INSERTION     Left eye at [facility]  . TUBAL LIGATION  1968    OB History    No data available       Home Medications    Prior to Admission medications   Medication Sig Start Date End Date Taking? Authorizing Provider  aspirin 81 MG tablet Take 81 mg by mouth  daily.      [provider]  atenolol (TENORMIN) 25 MG tablet Take 25 mg by mouth daily.      [provider]  carbidopa-levodopa (SINEMET IR) 25-100 MG tablet Take 1.5 tablets by mouth 4 (four) times daily. 04/15/16   Huston Foley, MD  citalopram (CELEXA) 20 MG tablet Take 1 tablet (20 mg total) by mouth at bedtime. 10/09/15   Huston Foley, MD  estrogens, conjugated, (PREMARIN) 0.625 MG tablet Take 0.625 mg by mouth daily. Take daily for 21 days then do not take for 7 days.    [provider]  levothyroxine (SYNTHROID,  LEVOTHROID) 50 MCG tablet Take 50 mcg by mouth daily before breakfast.    [provider]  loteprednol (LOTEMAX) 0.5 % ophthalmic suspension Place 1 drop into the right eye 4 (four) times daily.     [provider]  memantine (NAMENDA) 10 MG tablet Take 10 mg by mouth daily.    [provider]  oxyCODONE (OXY IR/ROXICODONE) 5 MG immediate release tablet Take 2.5 mg by mouth at bedtime.    [provider]  pantoprazole (PROTONIX) 40 MG tablet Take 40 mg by mouth daily.    [provider]  perphenazine 4 MG tablet Take 4 mg by mouth daily.     [provider]  rOPINIRole (REQUIP) 0.5 MG tablet Take 1 tablet (0.5 mg total) by mouth 3 (three) times daily. 10/09/15   Huston Foley, MD  SINGULAIR 10 MG tablet TAKE ONE TABLET BY MOUTH EVERY DAY AS NEEDED Patient taking differently: TAKE ONE TABLET BY MOUTH at bedtime 09/20/10   Donato Schultz, DO    Family History Family History  Problem Relation Age of Onset  . Lung cancer Father   . ALS Sister   . Liver disease Sister   . Colon cancer Neg Hx   . Stroke Mother   . Parkinson's disease Mother   . Parkinson's disease Maternal Grandmother   . Parkinson's disease Maternal Aunt   . Emphysema Sister   . Emphysema Brother   . Colon polyps Neg Hx   . Diabetes Neg Hx   . Gallbladder disease Neg Hx   . Esophageal cancer Neg Hx     Social History Social History  Substance Use Topics  . Smoking status: Never Smoker  . Smokeless tobacco: Never Used  . Alcohol use No     Allergies   Aricept [donepezil hcl]; Doxycycline; Erythromycin; Penicillins; and Pneumococcal vaccines   Review of Systems Review of Systems Level 5 caveat  Physical Exam Updated Vital Signs BP (!) 158/72   Pulse (!) 54   Temp 98.5 F (36.9 C) (Oral)   Resp 18   SpO2 96%   Physical Exam  Constitutional: She appears well-developed and well-nourished.  HENT:  Head: Normocephalic and atraumatic.  Right Ear:  External ear normal.  Left Ear: External ear normal.  Nose: Nose normal.  Mouth/Throat: Uvula is midline, oropharynx is clear and moist and mucous membranes are normal. No tonsillar exudate.  Eyes: Pupils are equal, round, and reactive to light. Right eye exhibits no discharge. Left eye exhibits no discharge. No scleral icterus.  Neck: Trachea normal. Neck supple. No spinous process tenderness present. No neck rigidity. Normal range of motion present.  Cardiovascular: Normal rate, regular rhythm and intact distal pulses.   No murmur heard. Pulses:      Radial pulses are 2+ on the right side, and 2+ on the left side.  Dorsalis pedis pulses are 2+ on the right side, and 2+ on the left side.       Posterior tibial pulses are 2+ on the right side, and 2+ on the left side.  No lower extremity swelling or edema. Calves symmetric in size bilaterally.  Pulmonary/Chest: Effort normal and breath sounds normal. She exhibits no tenderness.  Abdominal: Soft. She exhibits no distension. Bowel sounds are decreased. There is generalized tenderness and tenderness in the left lower quadrant. There is no rigidity, no rebound, no guarding and no CVA tenderness.  Musculoskeletal: She exhibits no edema.       Right hip: She exhibits normal range of motion and no tenderness.       Left hip: She exhibits normal range of motion and no tenderness.  Negative logroll test.  No sacral crepitus.  Lymphadenopathy:    She has no cervical adenopathy.  Neurological: She is alert.  Patient is oriented to person.  She is able to state her birthday.  She is not oriented to place or time.  The patient with baseline tremor of upper extremity.  Facial fasciculations of the jaw. Speech clear. Follows commands. No facial droop but patient jaw does move to left when phonating. This is normal per family. PERRLA. EOM grossly intact. CN III-XII grossly intact. Grossly moves all extremities 4 without ataxia and has appropriate  strength.   Skin: Skin is warm and dry. No rash noted. She is not diaphoretic.  Psychiatric: She has a normal mood and affect.  Nursing note and vitals reviewed.    ED Treatments / Results  Labs (all labs ordered are listed, but only abnormal results are displayed) Labs Reviewed  COMPREHENSIVE METABOLIC PANEL - Abnormal; Notable for the following:       Result Value   ALT 13 (*)    GFR calc non Af Amer 56 (*)    All other components within normal limits  CBC WITH DIFFERENTIAL/PLATELET - Abnormal; Notable for the following:    RBC 3.68 (*)    Hemoglobin 11.6 (*)    HCT 35.7 (*)    All other components within normal limits  URINALYSIS, ROUTINE W REFLEX MICROSCOPIC - Abnormal; Notable for the following:    Leukocytes, UA LARGE (*)    Bacteria, UA MANY (*)    Squamous Epithelial / LPF 0-5 (*)    All other components within normal limits  PROTIME-INR  APTT  LIPASE, BLOOD  CBG MONITORING, ED  I-STAT TROPONIN, ED  I-STAT CHEM 8, ED  CBG MONITORING, ED  TYPE AND SCREEN  ABO/RH    EKG  EKG Interpretation None       Radiology Dg Chest 2 View  Result Date: 12/30/2016 CLINICAL DATA:  Altered mental status. History of asthma, hypertension. EXAM: CHEST  2 VIEW COMPARISON:  Chest x-ray of May 22, 2014 FINDINGS: The lungs are adequately inflated. There is no focal infiltrate. The heart and pulmonary vascularity are normal. Coronary artery calcifications are observed in there is calcification in the region of the mitral valvular annulus. There is calcification in the wall of the thoracic aorta. There is no pleural effusion. The bony thorax exhibits no acute abnormality. IMPRESSION: There is no acute cardiopulmonary abnormality. Thoracic aortic atherosclerosis and coronary artery atherosclerosis. Electronically Signed   By: David  [place] M.D.   On: 12/30/2016 15:28   Ct Head Wo Contrast  Result Date: 12/30/2016 CLINICAL DATA:  Altered level of consciousness. EXAM: CT HEAD WITHOUT  CONTRAST TECHNIQUE: Contiguous axial images were  obtained from the base of the skull through the vertex without intravenous contrast. COMPARISON:  10/17/2016 FINDINGS: Brain: No evidence of acute infarction, hemorrhage, hydrocephalus, extra-axial collection or mass lesion/mass effect. Prominence of the sulci and ventricles noted compatible with brain atrophy. There is mild diffuse low-attenuation within the subcortical and periventricular white matter compatible with chronic microvascular disease. Vascular: No hyperdense vessel or unexpected calcification. Skull: Normal. Negative for fracture or focal lesion. Sinuses/Orbits: No acute finding. Other: None. IMPRESSION: 1. No acute intracranial abnormalities. 2. Chronic small vessel ischemic change and brain atrophy. Electronically Signed   By: Signa Kellaylor  Stroud M.D.   On: 12/30/2016 15:47    Procedures Procedures (including critical care time)  Medications Ordered in ED Medications  ciprofloxacin (CIPRO) IVPB 200 mg (200 mg Intravenous New Bag/Given 12/30/16 1845)  iopamidol (ISOVUE-300) 61 % injection 30 mL (30 mLs Oral Contrast Given 12/30/16 1728)  iopamidol (ISOVUE-300) 61 % injection (  Contrast Given 12/30/16 1905)  iopamidol (ISOVUE-300) 61 % injection 100 mL (100 mLs Intravenous Contrast Given 12/30/16 1915)     Initial Impression / Assessment and Plan / ED Course  I have reviewed the triage vital signs and the nursing notes.  Pertinent labs & imaging results that were available during my care of the patient were reviewed by me and considered in my medical decision making (see chart for details).     83 y.o. female presenting with altered mental status.   Per husband patient did just start Senokot and risperidone per husband yesterday and has had generalized abdominal pain and diarrhea over the last several days. There was no falls.  She has had multiple abdominal surgeries in the past. Patient vital signs are without fever, tachypnea,  tachycardia, hypotension, or hypoxia. She is A&O to person, not place or time. Exam with abdominal TTP. No peritoneal signs. Neuro exam at baseline per family. No falls. No evidence of MSK injury.   ECG with sinus rhythm. CXR with no acute cardiopulmonary disease.  CT head with no acute intracranial abnormality. CBG without hypoglycemia.  Normal electrolyte values and no evidence of AKI. Tn 0.00. CBC without leukocytosis. Hgb similar to previous. UA with evidence of UTI. Will send for culture. Will give Cirpofloxacin as patient has allergy to Ceftriaxone. CT with no acute findings in the abdomen or pelvis. There is ring like calcification around urethra that may be stone.   Will call hospitalist for admission. Dr Pearson GrippeJames Kim agrees to admit the patient. Family made aware of this earlier this evening.   Patient case seen and discussed with Dr. Erma HeritageIsaacs who is in agreement with plan.   Final Clinical Impressions(s) / ED Diagnoses   Final diagnoses:  Acute cystitis without hematuria  Altered mental status, unspecified altered mental status type    New Prescriptions New Prescriptions   No medications on file     Princella PellegriniMaczis, Adoni Greenough M, PA-C 12/30/16 2111    Shaune PollackIsaacs, Cameron, MD 12/31/16 1153

## 2016-12-30 NOTE — ED Triage Notes (Signed)
Patient brought in by EMS due to starting new medication. Patient had multi bouts of diarrhea. Patient has been more lethargic today. Patient has a history of dementia and parkinson's.

## 2016-12-31 DIAGNOSIS — K224 Dyskinesia of esophagus: Secondary | ICD-10-CM | POA: Diagnosis present

## 2016-12-31 DIAGNOSIS — F0280 Dementia in other diseases classified elsewhere without behavioral disturbance: Secondary | ICD-10-CM

## 2016-12-31 DIAGNOSIS — N361 Urethral diverticulum: Secondary | ICD-10-CM | POA: Diagnosis present

## 2016-12-31 DIAGNOSIS — F0390 Unspecified dementia without behavioral disturbance: Secondary | ICD-10-CM | POA: Diagnosis present

## 2016-12-31 DIAGNOSIS — Z880 Allergy status to penicillin: Secondary | ICD-10-CM | POA: Diagnosis not present

## 2016-12-31 DIAGNOSIS — R4182 Altered mental status, unspecified: Secondary | ICD-10-CM | POA: Diagnosis not present

## 2016-12-31 DIAGNOSIS — J45909 Unspecified asthma, uncomplicated: Secondary | ICD-10-CM | POA: Diagnosis present

## 2016-12-31 DIAGNOSIS — N183 Chronic kidney disease, stage 3 (moderate): Secondary | ICD-10-CM | POA: Diagnosis present

## 2016-12-31 DIAGNOSIS — F3289 Other specified depressive episodes: Secondary | ICD-10-CM | POA: Diagnosis present

## 2016-12-31 DIAGNOSIS — G92 Toxic encephalopathy: Secondary | ICD-10-CM | POA: Diagnosis present

## 2016-12-31 DIAGNOSIS — N201 Calculus of ureter: Secondary | ICD-10-CM | POA: Diagnosis present

## 2016-12-31 DIAGNOSIS — Z881 Allergy status to other antibiotic agents status: Secondary | ICD-10-CM | POA: Diagnosis not present

## 2016-12-31 DIAGNOSIS — Z947 Corneal transplant status: Secondary | ICD-10-CM | POA: Diagnosis not present

## 2016-12-31 DIAGNOSIS — K219 Gastro-esophageal reflux disease without esophagitis: Secondary | ICD-10-CM | POA: Diagnosis present

## 2016-12-31 DIAGNOSIS — G4700 Insomnia, unspecified: Secondary | ICD-10-CM | POA: Diagnosis present

## 2016-12-31 DIAGNOSIS — G20 Parkinson's disease: Secondary | ICD-10-CM | POA: Diagnosis not present

## 2016-12-31 DIAGNOSIS — N3000 Acute cystitis without hematuria: Secondary | ICD-10-CM | POA: Diagnosis not present

## 2016-12-31 DIAGNOSIS — F419 Anxiety disorder, unspecified: Secondary | ICD-10-CM | POA: Diagnosis present

## 2016-12-31 DIAGNOSIS — E876 Hypokalemia: Secondary | ICD-10-CM | POA: Diagnosis present

## 2016-12-31 DIAGNOSIS — Z888 Allergy status to other drugs, medicaments and biological substances status: Secondary | ICD-10-CM | POA: Diagnosis not present

## 2016-12-31 DIAGNOSIS — G319 Degenerative disease of nervous system, unspecified: Secondary | ICD-10-CM | POA: Diagnosis present

## 2016-12-31 DIAGNOSIS — E039 Hypothyroidism, unspecified: Secondary | ICD-10-CM | POA: Diagnosis present

## 2016-12-31 DIAGNOSIS — G4733 Obstructive sleep apnea (adult) (pediatric): Secondary | ICD-10-CM | POA: Diagnosis present

## 2016-12-31 DIAGNOSIS — Z8619 Personal history of other infectious and parasitic diseases: Secondary | ICD-10-CM | POA: Diagnosis not present

## 2016-12-31 DIAGNOSIS — I129 Hypertensive chronic kidney disease with stage 1 through stage 4 chronic kidney disease, or unspecified chronic kidney disease: Secondary | ICD-10-CM | POA: Diagnosis present

## 2016-12-31 LAB — CBC
HEMATOCRIT: 33.1 % — AB (ref 36.0–46.0)
Hemoglobin: 11.1 g/dL — ABNORMAL LOW (ref 12.0–15.0)
MCH: 32 pg (ref 26.0–34.0)
MCHC: 33.5 g/dL (ref 30.0–36.0)
MCV: 95.4 fL (ref 78.0–100.0)
Platelets: 181 10*3/uL (ref 150–400)
RBC: 3.47 MIL/uL — ABNORMAL LOW (ref 3.87–5.11)
RDW: 13.3 % (ref 11.5–15.5)
WBC: 7.6 10*3/uL (ref 4.0–10.5)

## 2016-12-31 LAB — RPR: RPR Ser Ql: NONREACTIVE

## 2016-12-31 LAB — COMPREHENSIVE METABOLIC PANEL
ALBUMIN: 3.3 g/dL — AB (ref 3.5–5.0)
ALT: 16 U/L (ref 14–54)
AST: 21 U/L (ref 15–41)
Alkaline Phosphatase: 73 U/L (ref 38–126)
Anion gap: 10 (ref 5–15)
BUN: 19 mg/dL (ref 6–20)
CHLORIDE: 100 mmol/L — AB (ref 101–111)
CO2: 26 mmol/L (ref 22–32)
Calcium: 8.9 mg/dL (ref 8.9–10.3)
Creatinine, Ser: 1.09 mg/dL — ABNORMAL HIGH (ref 0.44–1.00)
GFR calc Af Amer: 52 mL/min — ABNORMAL LOW (ref >60)
GFR calc non Af Amer: 45 mL/min — ABNORMAL LOW (ref >60)
GLUCOSE: 92 mg/dL (ref 65–99)
POTASSIUM: 3.9 mmol/L (ref 3.5–5.1)
Sodium: 136 mmol/L (ref 135–145)
Total Bilirubin: 0.8 mg/dL (ref 0.3–1.2)
Total Protein: 6.4 g/dL — ABNORMAL LOW (ref 6.5–8.1)

## 2016-12-31 LAB — TSH: TSH: 1.059 u[IU]/mL (ref 0.350–4.500)

## 2016-12-31 LAB — VITAMIN B12: Vitamin B-12: 257 pg/mL (ref 180–914)

## 2016-12-31 LAB — SEDIMENTATION RATE: Sed Rate: 27 mm/hr — ABNORMAL HIGH (ref 0–22)

## 2016-12-31 MED ORDER — ACETAMINOPHEN 325 MG PO TABS: 650.0000 mg | ORAL_TABLET | Freq: Four times a day (QID) | ORAL | Status: DC | PRN

## 2016-12-31 MED ORDER — ATENOLOL 25 MG PO TABS
25.0000 mg | ORAL_TABLET | Freq: Every day | ORAL | Status: DC
  Administered 2016-12-31 – 2017-01-02 (×3): 25 mg via ORAL
  Filled 2016-12-31 (×4): qty 1

## 2016-12-31 MED ORDER — ACETAMINOPHEN 650 MG RE SUPP: 650.0000 mg | Freq: Four times a day (QID) | RECTAL | Status: DC | PRN

## 2016-12-31 MED ORDER — LEVOFLOXACIN IN D5W 750 MG/150ML IV SOLN
750.0000 mg | INTRAVENOUS | Status: DC
  Administered 2016-12-31: 750 mg via INTRAVENOUS
  Filled 2016-12-31: qty 150

## 2016-12-31 MED ORDER — LEVOTHYROXINE SODIUM 50 MCG PO TABS
50.0000 ug | ORAL_TABLET | Freq: Every day | ORAL | Status: DC
  Administered 2016-12-31 – 2017-01-02 (×3): 50 ug via ORAL
  Filled 2016-12-31 (×3): qty 1

## 2016-12-31 MED ORDER — SODIUM CHLORIDE 0.9 % IV SOLN
INTRAVENOUS | Status: DC
  Administered 2016-12-31: 02:00:00 via INTRAVENOUS

## 2016-12-31 MED ORDER — TRIAZOLAM 0.25 MG PO TABS
0.2500 mg | ORAL_TABLET | Freq: Every evening | ORAL | Status: DC | PRN
  Filled 2016-12-31: qty 1

## 2016-12-31 MED ORDER — CLONAZEPAM 0.5 MG PO TABS
0.5000 mg | ORAL_TABLET | Freq: Three times a day (TID) | ORAL | Status: DC | PRN
  Administered 2017-01-01: 0.5 mg via ORAL
  Filled 2016-12-31: qty 1

## 2016-12-31 MED ORDER — LOTEPREDNOL ETABONATE 0.5 % OP SUSP
1.0000 [drp] | Freq: Four times a day (QID) | OPHTHALMIC | Status: DC
  Administered 2016-12-31 – 2017-01-02 (×9): 1 [drp] via OPHTHALMIC
  Filled 2016-12-31: qty 5

## 2016-12-31 MED ORDER — PANTOPRAZOLE SODIUM 40 MG PO TBEC
40.0000 mg | DELAYED_RELEASE_TABLET | Freq: Every day | ORAL | Status: DC
  Administered 2016-12-31 – 2017-01-02 (×3): 40 mg via ORAL
  Filled 2016-12-31 (×3): qty 1

## 2016-12-31 MED ORDER — SODIUM CHLORIDE (HYPERTONIC) 2 % OP SOLN
1.0000 [drp] | Freq: Four times a day (QID) | OPHTHALMIC | Status: DC
  Administered 2016-12-31 – 2017-01-02 (×8): 1 [drp] via OPHTHALMIC
  Filled 2016-12-31: qty 15

## 2016-12-31 MED ORDER — ASPIRIN EC 81 MG PO TBEC
81.0000 mg | DELAYED_RELEASE_TABLET | Freq: Every day | ORAL | Status: DC
  Administered 2016-12-31 – 2017-01-02 (×3): 81 mg via ORAL
  Filled 2016-12-31 (×3): qty 1

## 2016-12-31 MED ORDER — MEMANTINE HCL 10 MG PO TABS
10.0000 mg | ORAL_TABLET | Freq: Every day | ORAL | Status: DC
  Administered 2016-12-31 – 2017-01-02 (×3): 10 mg via ORAL
  Filled 2016-12-31 (×3): qty 1

## 2016-12-31 MED ORDER — CHOLECALCIFEROL 10 MCG (400 UNIT) PO TABS
800.0000 [IU] | ORAL_TABLET | Freq: Every day | ORAL | Status: DC
  Administered 2016-12-31 – 2017-01-02 (×3): 800 [IU] via ORAL
  Filled 2016-12-31 (×3): qty 2

## 2016-12-31 MED ORDER — ESTROGENS CONJUGATED 0.625 MG PO TABS
0.6250 mg | ORAL_TABLET | Freq: Every day | ORAL | Status: DC
  Administered 2016-12-31 – 2017-01-02 (×3): 0.625 mg via ORAL
  Filled 2016-12-31 (×3): qty 1

## 2016-12-31 MED ORDER — CITALOPRAM HYDROBROMIDE 20 MG PO TABS
20.0000 mg | ORAL_TABLET | Freq: Every day | ORAL | Status: DC
  Administered 2016-12-31 – 2017-01-01 (×2): 20 mg via ORAL
  Filled 2016-12-31 (×2): qty 1

## 2016-12-31 MED ORDER — LEVOFLOXACIN IN D5W 250 MG/50ML IV SOLN
250.0000 mg | INTRAVENOUS | Status: DC
  Filled 2016-12-31: qty 50

## 2016-12-31 MED ORDER — CARBIDOPA-LEVODOPA 25-100 MG PO TABS
1.5000 | ORAL_TABLET | Freq: Four times a day (QID) | ORAL | Status: DC
  Administered 2016-12-31 – 2017-01-02 (×9): 1.5 via ORAL
  Filled 2016-12-31 (×9): qty 2

## 2016-12-31 MED ORDER — ENOXAPARIN SODIUM 40 MG/0.4ML ~~LOC~~ SOLN
40.0000 mg | SUBCUTANEOUS | Status: DC
  Administered 2016-12-31: 40 mg via SUBCUTANEOUS
  Filled 2016-12-31: qty 0.4

## 2016-12-31 NOTE — Progress Notes (Signed)
@IPLOG @        PROGRESS NOTE                                                                                                                                                                                                             Patient Demographics:    Deanna Page, is a 83 y.o. female, DOB - [birth year], ZOX:[id number]  Admit date - 12/30/2016   Admitting Physician Pearson Grippe, MD  Outpatient Primary MD for the patient is Jethro Bastos, MD  LOS - 0  No chief complaint on file.      Brief Narrative   Deanna Page  is a 83 y.o. female, w Hypertension, Ckd stage 3, Hypothyroidism,  Dementia, Parkinsons, who presents with AMS  As well as dysuria x3 weeks per pt.  Pt denies fever, chills, flank pain, n/v, diarrhea, brbpr.  Pt presented to ED for evaluation.    Subjective:    Deanna Page today has, No headache, No chest pain, No abdominal pain - No Nausea, No new weakness tingling or numbness, No Cough - SOB.     Assessment  & Plan :     1.  Toxic encephalopathy due to UTI.  Seems to have improved on empiric Levaquin which will be continued, follow cultures, baseline appears to be weak and frail, PT eval may require SNF.  Head CT was nonacute no focal findings.  2.  Ureteral stone.  Discussed the case with urologist on call Dr. Marlou Porch, no intervention needed.  3.  Hypothyroidism.  On Synthroid.  4.  History of Parkinson's disease and dementia.  Supportive care continue home medications, at risk for delirium, minimize narcotics and benzodiazepines.  5.  GERD.  On PPI continue.  6.  Central hypertension.  Continue beta-blocker unchanged.    Diet : Diet regular Room service appropriate? Yes; Fluid consistency: Thin    Family Communication  :  None  Code Status :  Full  Disposition Plan  :  TBD  Consults  :  None  Procedures  :    CT head.  Nonacute  Chest x-ray.  Nonacute  CT abdomen pelvis. IMPRESSION: 1. No acute findings in the abdomen or pelvis.  Specifically, no findings to explain the patient's history of abdominal pain. 2. Ring-like calcification around the urethra, likely stone within a urethral diverticulum. Correlation for urinary incontinence/dribbling or recurrent UTI suggested. 3. Extrahepatic common bile duct measures up to 10 mm, unchanged since 2005 and likely related to prior cholecystectomy. 4. Left colonic diverticulosis without diverticulitis.  5.  Aortic Atherosclerois   DVT Prophylaxis  :  Lovenox   Lab Results  Component Value Date   PLT 181 12/31/2016    Inpatient Medications  Scheduled Meds: . aspirin EC  81 mg Oral Daily  . atenolol  25 mg Oral Daily  . carbidopa-levodopa  1.5 tablet Oral QID  . cholecalciferol  800 Units Oral Daily  . citalopram  20 mg Oral QHS  . enoxaparin (LOVENOX) injection  40 mg Subcutaneous Q24H  . estrogens (conjugated)  0.625 mg Oral Daily  . levothyroxine  50 mcg Oral QAC breakfast  . loteprednol  1 drop Right Eye QID  . memantine  10 mg Oral Daily  . pantoprazole  40 mg Oral Daily  . sodium chloride  1 drop Both Eyes QID   Continuous Infusions: . sodium chloride 75 mL/hr at 12/31/16 0226  . [START ON 01/01/2017] levofloxacin (LEVAQUIN) IV     PRN Meds:.acetaminophen **OR** acetaminophen, clonazePAM, triazolam  Antibiotics  :    Anti-infectives    Start     Dose/Rate Route Frequency Ordered Stop   01/01/17 0800  Levofloxacin (LEVAQUIN) IVPB 250 mg     250 mg 50 mL/hr over 60 Minutes Intravenous Every 24 hours 12/31/16 0916     12/31/16 0600  levofloxacin (LEVAQUIN) IVPB 750 mg  Status:  Discontinued     750 mg 100 mL/hr over 90 Minutes Intravenous Every 48 hours 12/31/16 0146 12/31/16 0916   12/30/16 1745  ciprofloxacin (CIPRO) IVPB 200 mg     200 mg 100 mL/hr over 60 Minutes Intravenous  Once 12/30/16 1715 12/30/16 2046         Objective:   Vitals:   12/30/16 2252 12/30/16 2330 12/31/16 0125 12/31/16 0516  BP: (!) 128/115 111/83 (!) 154/93 (!) 145/90   Pulse: 68 61 63 (!) 55  Resp: (!) 22 (!) 24 16 16   Temp:   98.1 F (36.7 C) 98.9 F (37.2 C)  TempSrc:   Oral Oral  SpO2: 100% 100% 99% 99%  Weight:   55.3 kg (121 lb 14.6 oz) 55 kg (121 lb 4.1 oz)  Height:   5\' 3"  (1.6 m)     Wt Readings from Last 3 Encounters:  12/31/16 55 kg (121 lb 4.1 oz)  08/17/14 63 kg (139 lb)  07/18/14 63 kg (139 lb)     Intake/Output Summary (Last 24 hours) at 12/31/16 1238 Last data filed at 12/31/16 0900  Gross per 24 hour  Intake            627.5 ml  Output                0 ml  Net            627.5 ml     Physical Exam  Awake, mildly confused, No new F.N deficits, Normal affect [place].AT,PERRAL Supple Neck,No JVD, No cervical lymphadenopathy appriciated.  Symmetrical Chest wall movement, Good air movement bilaterally, CTAB RRR,No Gallops,Rubs or new Murmurs, No Parasternal Heave +ve B.Sounds, Abd Soft, No tenderness, No organomegaly appriciated, No rebound - guarding or rigidity. No Cyanosis, Clubbing or edema, No new Rash or bruise       Data Review:    CBC  Recent Labs Lab 12/30/16 1525 12/30/16 1541 12/31/16 0414  WBC 6.2  --  7.6  HGB 11.6* 12.6 11.1*  HCT 35.7* 37.0 33.1*  PLT 183  --  181  MCV 97.0  --  95.4  MCH 31.5  --  32.0  MCHC 32.5  --  33.5  RDW 13.4  --  13.3  LYMPHSABS 1.8  --   --   MONOABS 0.6  --   --   EOSABS 0.3  --   --   BASOSABS 0.1  --   --     Chemistries   Recent Labs Lab 12/30/16 1525 12/30/16 1541 12/31/16 0414  NA 141 140 136  K 4.1 4.0 3.9  CL 103 102 100*  CO2 31  --  26  GLUCOSE 91 85 92  BUN 14 13 19   CREATININE 0.91 1.00 1.09*  CALCIUM 9.1  --  8.9  AST 20  --  21  ALT 13*  --  16  ALKPHOS 79  --  73  BILITOT 0.5  --  0.8   ------------------------------------------------------------------------------------------------------------------ No results for input(s): CHOL, HDL, LDLCALC, TRIG, CHOLHDL, LDLDIRECT in the last 72 hours.  Lab Results  Component Value Date    HGBA1C 5.7 12/24/2005   ------------------------------------------------------------------------------------------------------------------  Recent Labs  12/31/16 0414  TSH 1.059   ------------------------------------------------------------------------------------------------------------------ No results for input(s): VITAMINB12, FOLATE, FERRITIN, TIBC, IRON, RETICCTPCT in the last 72 hours.  Coagulation profile  Recent Labs Lab 12/30/16 1525  INR 0.96    No results for input(s): DDIMER in the last 72 hours.  Cardiac Enzymes No results for input(s): CKMB, TROPONINI, MYOGLOBIN in the last 168 hours.  Invalid input(s): CK ------------------------------------------------------------------------------------------------------------------    Component Value Date/Time   BNP 488.0 (H) 05/22/2014 1853    Micro Results No results found for this or any previous visit (from the past 240 hour(s)).  Radiology Reports Dg Chest 2 View  Result Date: 12/30/2016 CLINICAL DATA:  Altered mental status. History of asthma, hypertension. EXAM: CHEST  2 VIEW COMPARISON:  Chest x-ray of May 22, 2014 FINDINGS: The lungs are adequately inflated. There is no focal infiltrate. The heart and pulmonary vascularity are normal. Coronary artery calcifications are observed in there is calcification in the region of the mitral valvular annulus. There is calcification in the wall of the thoracic aorta. There is no pleural effusion. The bony thorax exhibits no acute abnormality. IMPRESSION: There is no acute cardiopulmonary abnormality. Thoracic aortic atherosclerosis and coronary artery atherosclerosis. Electronically Signed   By: David  [place] M.D.   On: 12/30/2016 15:28   Ct Head Wo Contrast  Result Date: 12/30/2016 CLINICAL DATA:  Altered level of consciousness. EXAM: CT HEAD WITHOUT CONTRAST TECHNIQUE: Contiguous axial images were obtained from the base of the skull through the vertex without intravenous  contrast. COMPARISON:  10/17/2016 FINDINGS: Brain: No evidence of acute infarction, hemorrhage, hydrocephalus, extra-axial collection or mass lesion/mass effect. Prominence of the sulci and ventricles noted compatible with brain atrophy. There is mild diffuse low-attenuation within the subcortical and periventricular white matter compatible with chronic microvascular disease. Vascular: No hyperdense vessel or unexpected calcification. Skull: Normal. Negative for fracture or focal lesion. Sinuses/Orbits: No acute finding. Other: None. IMPRESSION: 1. No acute intracranial abnormalities. 2. Chronic small vessel ischemic change and brain atrophy. Electronically Signed   By: Signa Kell M.D.   On: 12/30/2016 15:47   Ct Abdomen Pelvis W Contrast  Result Date: 12/30/2016 CLINICAL DATA:  Abdominal pain. EXAM: CT ABDOMEN AND PELVIS WITH CONTRAST TECHNIQUE: Multidetector CT imaging of the abdomen and pelvis was performed using the standard protocol following bolus administration of intravenous contrast. CONTRAST:  ISOVUE-300 IOPAMIDOL (ISOVUE-300) INJECTION 61% COMPARISON:  07/15/2003. FINDINGS: Lower chest:  Unremarkable Hepatobiliary: No focal abnormality within the liver parenchyma.  Gallbladder surgically absent. Common bile duct distended up to 10 mm diameter. This is stable since 07/15/2003. Pancreas: 8 mm cystic lesion identified in the body of pancreas (image 23 series 2). No dilatation of the main duct. Spleen: No splenomegaly. No focal mass lesion. Adrenals/Urinary Tract: No adrenal nodule or mass. Kidneys are unremarkable. No evidence for hydroureter. The urinary bladder appears normal for the degree of distention. Ring-like calcification identified in the region of the urethra raising the question of stone disease within a urethral diverticulum. Stomach/Bowel: Stomach is nondistended. No gastric wall thickening. No evidence of outlet obstruction. Duodenum is normally positioned as is the ligament of  Treitz. No small bowel wall thickening. No small bowel dilatation. The terminal ileum is normal. The appendix is not visualized, but there is no edema or inflammation in the region of the cecum. Right colon decompressed but otherwise unremarkable. Mild diverticular changes noted left colon without features of diverticulitis. Vascular/Lymphatic: There is abdominal aortic atherosclerosis without aneurysm. There is no gastrohepatic or hepatoduodenal ligament lymphadenopathy. No intraperitoneal or retroperitoneal lymphadenopathy. No pelvic sidewall lymphadenopathy. There is some laminar flow of un opacified blood in the right common femoral vein. Reproductive: Uterus surgically absent.  There is no adnexal mass. Other: No intraperitoneal free fluid. Musculoskeletal: Bone windows reveal no worrisome lytic or sclerotic osseous lesions. Thoracolumbar scoliosis evident. Degenerative changes most advanced at L4-5 in the lumbar spine. IMPRESSION: 1. No acute findings in the abdomen or pelvis. Specifically, no findings to explain the patient's history of abdominal pain. 2. Ring-like calcification around the urethra, likely stone within a urethral diverticulum. Correlation for urinary incontinence/dribbling or recurrent UTI suggested. 3. Extrahepatic common bile duct measures up to 10 mm, unchanged since 2005 and likely related to prior cholecystectomy. 4. Left colonic diverticulosis without diverticulitis. 5.  Aortic Atherosclerois (ICD10-170.0) Electronically Signed   By: Kennith CenterEric  Mansell M.D.   On: 12/30/2016 20:05    Time Spent in minutes  30   Susa RaringPrashant Kalany Diekmann M.D on 12/31/2016 at 12:38 PM  Between 7am to 7pm - Pager - [phone] ( page via [email or web address], text pages only, please mention full 10 digit call back number). After 7pm go to [email or web address] - password [email or web address]

## 2016-12-31 NOTE — Progress Notes (Signed)
PT Cancellation Note  Patient Details Name: Briant Sitesvelyn W Johal MRN: [id number] DOB: 1929/07/21   Cancelled Treatment:    Reason Eval/Treat Not Completed: Patient declined, staes hat she is going home tonight(not true per Rn). Check back in AM.  Rada HayHill, Dequon Schnebly Elizabeth 12/31/2016, 3:36 PM Blanchard KelchKaren Chistopher Mangino PT [phone]

## 2016-12-31 NOTE — Progress Notes (Signed)
Pharmacy Antibiotic Note  Deanna Page is a 83 y.o. female with altered mental status admitted on 12/30/2016 with UTI.  Pharmacy has been consulted for levaquin dosing.  Plan: Levaquin 750 mg IV q48h F/u scr/cultures    Temp (24hrs), Avg:98.5 F (36.9 C), Min:98.5 F (36.9 C), Max:98.5 F (36.9 C)   Recent Labs Lab 12/30/16 1525 12/30/16 1541  WBC 6.2  --   CREATININE 0.91 1.00    CrCl cannot be calculated (Unknown ideal weight.).    Allergies  Allergen Reactions  . Erythromycin     Other reaction(s): Other (See Comments) Other Reaction: GI Upset Unknown reaction  . Penicillins Rash and Shortness Of Breath    Unknown reaction Has patient had a PCN reaction causing immediate rash, facial/tongue/throat swelling, SOB or lightheadedness with hypotension: Unknown Has patient had a PCN reaction causing severe rash involving mucus membranes or skin necrosis: Unknown Has patient had a PCN reaction that required hospitalization: Unknown Has patient had a PCN reaction occurring within the last 10 years: Unknown If all of the above answers are "NO", then may proceed with Cephalosporin use.   Mart Piggs. Aricept [Donepezil Hcl]     Unknown reaction  . Doxycycline     Unknown reaction  . Pneumococcal Vaccines     Unknown reaction    Antimicrobials this admission: 10/29 cipro >> x1 ED 10/30 levaquin >>   Dose adjustments this admission:   Microbiology results:  BCx:   UCx:    Sputum:    MRSA PCR:   Thank you for allowing pharmacy to be a part of this patient's care.  Lorenza EvangelistGreen, Najee Manninen R 12/31/2016 12:17 AM

## 2016-12-31 NOTE — Progress Notes (Signed)
Pt is active with [facility]. She lives with her spouse and has an aide during the day. She has a hospital bed and walker at home. Pt wants to return home at discharge. This CM spoke with [facility] LushAndrea of [facility]. [facility] will follow along while pt is in the hospital and arrange outpatient or home care for pt if it is recommended. PT eval ordered. Sandford Crazeora Jiovany Scheffel RN,BSN,NCM [phone]

## 2016-12-31 NOTE — Progress Notes (Signed)
Pharmacy Antibiotic Note  Deanna Page is a 83 y.o. female with altered mental status admitted on 12/30/2016 with UTI.  Pharmacy has been consulted for levaquin dosing.  Today, 12/31/2016:  Afebrile  WBC wnl  SCr slightly elevated from baseline  No systemic symptoms or features on imaging to suggest pyelonephritis  Plan:  Will change to Levaquin dosing for complicated UTI  Levaquin 250 mg IV q24 hr  F/u scr/cultures  Height: 5\' 3"  (160 cm) Weight: 121 lb 4.1 oz (55 kg) IBW/kg (Calculated) : 52.4  Temp (24hrs), Avg:98.5 F (36.9 C), Min:98.1 F (36.7 C), Max:98.9 F (37.2 C)   Recent Labs Lab 12/30/16 1525 12/30/16 1541 12/31/16 0414  WBC 6.2  --  7.6  CREATININE 0.91 1.00 1.09*    Estimated Creatinine Clearance: 31.2 mL/min (A) (by C-G formula based on SCr of 1.09 mg/dL (H)).    Allergies  Allergen Reactions  . Erythromycin     Other reaction(s): Other (See Comments) Other Reaction: GI Upset Unknown reaction  . Penicillins Rash and Shortness Of Breath    Unknown reaction Has patient had a PCN reaction causing immediate rash, facial/tongue/throat swelling, SOB or lightheadedness with hypotension: Unknown Has patient had a PCN reaction causing severe rash involving mucus membranes or skin necrosis: Unknown Has patient had a PCN reaction that required hospitalization: Unknown Has patient had a PCN reaction occurring within the last 10 years: Unknown If all of the above answers are "NO", then may proceed with Cephalosporin use.   Deanna Page. Aricept [Donepezil Hcl]     Unknown reaction  . Doxycycline     Unknown reaction  . Pneumococcal Vaccines     Unknown reaction    Antimicrobials this admission: 10/29 cipro >> x1 ED 10/30 levaquin >>   Dose adjustments this admission:   Microbiology results: 10/29 UCx: sent  Thank you for allowing pharmacy to be a part of this patient's care.  Bernadene Personrew Herbert Marken, PharmD, BCPS Pager: [phone] 12/31/2016, 9:22  AM

## 2016-12-31 NOTE — Clinical Social Work Note (Signed)
Clinical Social Work Assessment  Patient Details  Name: Deanna Page MRN: [id number] Date of Birth: 1930-01-01  Date of referral:  12/31/16               Reason for consult:  Facility Placement                Permission sought to share information with:  Family Supports Permission granted to share information::  Yes, Verbal Permission Granted  Name::     husband Deanna Page, son Deanna Page (husband cell [phone])  Agency::     Relationship::     Contact Information:     Housing/Transportation Living arrangements for the past 2 months:  Single Family Home Source of Information:  Adult Children, Spouse, Medical Team Patient Interpreter Needed:  None Criminal Activity/Legal Involvement Pertinent to Current Situation/Hospitalization:  No - Comment as needed Significant Relationships:  Adult Children, Warehouse manager, Spouse Lives with:  Self, Spouse Do you feel safe going back to the place where you live?  Yes Need for family participation in patient care:  Yes (Comment) (altered mental status)  Care giving concerns:  Pt from home where she resides with her spouse. Per son has strong social support (family and [facility]), no caregiving concerns noted   Facilities manager / plan:  CSW acknowledged consult for nursing home placement. Noted PT eval pending. Met with pt at bedside- pt with altered mental status and was fearful of CSW. Spoke with pt's son and husband - reported pt has services through [facility] and are planning for pt to come home at [place]. Son states that pt would not be agreeable to SNF and due to her mental status family would feel uncomfortable with pt being in an unknown environment. States, "If SNF is recommended myself and my dad would need to work on convincing her. Please don't bring it up to her." States, "For what it's worth we would prefer to get any therapy she needs in the home."  Will if social work needs arise. Pt/family planning pt to return home at DC.    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 Pt's son/husband however demonstrate through understanding of pt's plan and potential barriers. Family emotionally positive, adaptable, and well-adjusted. Makes appropriate jokes with CSW.   Emotional Assessment Appearance:  Appears stated age Attitude/Demeanor/Rapport:   (fearful) Affect (typically observed):  Anxious Orientation:  Oriented to Self Alcohol / Substance use:  Not Applicable Psych involvement (Current and /or in the community):  No (Comment)  Discharge Needs  Concerns to be addressed:  No discharge needs identified Readmission within the last 30 days:  No Current discharge risk:  None Barriers to Discharge:  Continued Medical Work up   [employer], LCSW 12/31/2016, 3:17 PM  [phone]

## 2017-01-01 LAB — BASIC METABOLIC PANEL
ANION GAP: 11 (ref 5–15)
BUN: 26 mg/dL — ABNORMAL HIGH (ref 6–20)
CALCIUM: 8.9 mg/dL (ref 8.9–10.3)
CHLORIDE: 103 mmol/L (ref 101–111)
CO2: 21 mmol/L — AB (ref 22–32)
Creatinine, Ser: 1.38 mg/dL — ABNORMAL HIGH (ref 0.44–1.00)
GFR calc non Af Amer: 34 mL/min — ABNORMAL LOW (ref >60)
GFR, EST AFRICAN AMERICAN: 39 mL/min — AB (ref >60)
Glucose, Bld: 115 mg/dL — ABNORMAL HIGH (ref 65–99)
Potassium: 3 mmol/L — ABNORMAL LOW (ref 3.5–5.1)
SODIUM: 135 mmol/L (ref 135–145)

## 2017-01-01 LAB — CBC
HEMATOCRIT: 32.4 % — AB (ref 36.0–46.0)
HEMOGLOBIN: 11.1 g/dL — AB (ref 12.0–15.0)
MCH: 31.6 pg (ref 26.0–34.0)
MCHC: 34.3 g/dL (ref 30.0–36.0)
MCV: 92.3 fL (ref 78.0–100.0)
Platelets: 191 10*3/uL (ref 150–400)
RBC: 3.51 MIL/uL — AB (ref 3.87–5.11)
RDW: 13 % (ref 11.5–15.5)
WBC: 10.5 10*3/uL (ref 4.0–10.5)

## 2017-01-01 LAB — ANTINUCLEAR ANTIBODIES, IFA: ANA Ab, IFA: NEGATIVE

## 2017-01-01 MED ORDER — POTASSIUM CHLORIDE CRYS ER 20 MEQ PO TBCR
40.0000 meq | EXTENDED_RELEASE_TABLET | ORAL | Status: AC
  Administered 2017-01-01 (×2): 40 meq via ORAL
  Filled 2017-01-01 (×2): qty 2

## 2017-01-01 MED ORDER — ENSURE ENLIVE PO LIQD
237.0000 mL | Freq: Two times a day (BID) | ORAL | Status: DC
  Administered 2017-01-01: 237 mL via ORAL

## 2017-01-01 MED ORDER — ENOXAPARIN SODIUM 30 MG/0.3ML ~~LOC~~ SOLN
30.0000 mg | SUBCUTANEOUS | Status: DC
  Administered 2017-01-01: 30 mg via SUBCUTANEOUS

## 2017-01-01 MED ORDER — POTASSIUM CHLORIDE 10 MEQ/100ML IV SOLN
10.0000 meq | Freq: Once | INTRAVENOUS | Status: AC
  Administered 2017-01-01: 10 meq via INTRAVENOUS
  Filled 2017-01-01: qty 100

## 2017-01-01 MED ORDER — LEVOFLOXACIN 500 MG PO TABS
250.0000 mg | ORAL_TABLET | Freq: Every day | ORAL | Status: DC
  Administered 2017-01-02: 250 mg via ORAL
  Filled 2017-01-01 (×2): qty 1

## 2017-01-01 MED ORDER — ENOXAPARIN SODIUM 30 MG/0.3ML ~~LOC~~ SOLN
30.0000 mg | SUBCUTANEOUS | Status: DC
  Filled 2017-01-01: qty 0.3

## 2017-01-01 NOTE — Progress Notes (Signed)
PT Cancellation Note  Patient Details Name: Briant Sitesvelyn W Lopp MRN: [id number] DOB: [birth year]   Cancelled Treatment:    Reason Eval/Treat Not Completed: Patient declined, adamantly refused all various attempts to get patient to mobilize. Nursing reports  That patient awake most of night, is able to ambulate to the BR with 1 assist. No family present.  Rada HayHill, Nathaneil Feagans Elizabeth 01/01/2017, 8:58 AM Blanchard KelchKaren Autry Droege PT [phone]

## 2017-01-01 NOTE — Progress Notes (Signed)
[facility]  PROGRESS NOTE  Deanna Page WUJ:[id number] DOB: [birth year] DOA: 12/30/2016 PCP: Jethro Bastos, MD   Brief HPI:   EvelynCabanissis a 83 y.o.female,w Hypertension, Ckd stage 3, Hypothyroidism, Dementia, Parkinsons, who presents with AMS As well as dysuria x3 weeks per pt. Pt denies fever, chills, flank pain, n/v, diarrhea, brbpr. Pt presented to ED for evaluation.     Subjective   This morning patient does not feel good.  Complains of generalized aches and pains.   Assessment/Plan:     1. Acute encephalopathy-secondary to UTI, resolved.  Continue Levaquin.  Urine cultures growing greater than 100,000 colonies of E. Coli.  Final result is pending.  CT head was negative for acute findings 2. Uretheral stone-CT scan shows ringlike calcification in the urethral diverticulum, Dr. Thedore Mins discussed with his urologist Dr. Marlou Porch.  He does not recommend any intervention at this time. 3. Hypothyroidism-continue Synthroid. 4. History of Parkinson disease and dementia-continue Sinemet.  Minimize narcotics and benzodiazepines. 5. GERD-continue Protonix 40 mg p.o. daily 6. Hypertension-continue atenolol 7. Hypokalemia-we will replace potassium and check BMP in a.m.    DVT prophylaxis: Lovenox  Code Status: Full code  Family Communication: Discussed with patient's husband at bedside  Disposition Plan: Likely home in a.m.   Consultants:  None  Procedures:  None  Continuous infusions     Antibiotics:   Anti-infectives    Start     Dose/Rate Route Frequency Ordered Stop   01/02/17 1000  levofloxacin (LEVAQUIN) tablet 250 mg     250 mg Oral Daily 01/01/17 1204     01/01/17 0800  Levofloxacin (LEVAQUIN) IVPB 250 mg  Status:  Discontinued     250 mg 50 mL/hr over 60 Minutes Intravenous Every 24 hours 12/31/16 0916 01/01/17 1204   12/31/16 0600  levofloxacin (LEVAQUIN) IVPB 750 mg  Status:  Discontinued     750 mg 100 mL/hr over 90 Minutes  Intravenous Every 48 hours 12/31/16 0146 12/31/16 0916   12/30/16 1745  ciprofloxacin (CIPRO) IVPB 200 mg     200 mg 100 mL/hr over 60 Minutes Intravenous  Once 12/30/16 1715 12/30/16 2046       Objective   Vitals:   12/31/16 2104 01/01/17 0136 01/01/17 0500 01/01/17 1526  BP: (!) 141/67  122/76 130/61  Pulse: 69  67 61  Resp: 18  18 18   Temp: 99.4 F (37.4 C)  98 F (36.7 C) 98 F (36.7 C)  TempSrc: Oral  Oral Oral  SpO2: 98%  99% 100%  Weight:  58 kg (127 lb 13.9 oz)    Height:        Intake/Output Summary (Last 24 hours) at 01/01/17 1725 Last data filed at 01/01/17 1528  Gross per 24 hour  Intake              600 ml  Output                0 ml  Net              600 ml   Filed Weights   12/31/16 0125 12/31/16 0516 01/01/17 0136  Weight: 55.3 kg (121 lb 14.6 oz) 55 kg (121 lb 4.1 oz) 58 kg (127 lb 13.9 oz)     Physical Examination:   Physical Exam: Eyes: No icterus, extraocular muscles intact  Mouth: Oral mucosa is moist, no lesions on palate,  Neck: Supple, no deformities, masses, or tenderness Lungs: Normal respiratory effort, bilateral clear to auscultation, no crackles  or wheezes.  Heart: Regular rate and rhythm, S1 and S2 normal, no murmurs, rubs auscultated Abdomen: BS normoactive,soft,nondistended,non-tender to palpation,no organomegaly Extremities: No pretibial edema, no erythema, no cyanosis, no clubbing Neuro : Alert and oriented to time, place and person, No focal deficits  Skin: No rashes seen on exam       Data Reviewed: I have personally reviewed following labs and imaging studies  CBG:  Recent Labs Lab 12/30/16 1633  GLUCAP 70    CBC:  Recent Labs Lab 12/30/16 1525 12/30/16 1541 12/31/16 0414 01/01/17 0411  WBC 6.2  --  7.6 10.5  NEUTROABS 3.4  --   --   --   HGB 11.6* 12.6 11.1* 11.1*  HCT 35.7* 37.0 33.1* 32.4*  MCV 97.0  --  95.4 92.3  PLT 183  --  181 191    Basic Metabolic Panel:  Recent Labs Lab  12/30/16 1525 12/30/16 1541 12/31/16 0414 01/01/17 0411  NA 141 140 136 135  K 4.1 4.0 3.9 3.0*  CL 103 102 100* 103  CO2 31  --  26 21*  GLUCOSE 91 85 92 115*  BUN 14 13 19  26*  CREATININE 0.91 1.00 1.09* 1.38*  CALCIUM 9.1  --  8.9 8.9    Recent Results (from the past 240 hour(s))  Urine culture     Status: Abnormal (Preliminary result)   Collection Time: 12/30/16  3:25 PM  Result Value Ref Range Status   Specimen Description URINE, CLEAN CATCH  Final   Special Requests NONE  Final   Culture >=100,000 COLONIES/mL ESCHERICHIA COLI (A)  Final   Report Status PENDING  Incomplete     Liver Function Tests:  Recent Labs Lab 12/30/16 1525 12/31/16 0414  AST 20 21  ALT 13* 16  ALKPHOS 79 73  BILITOT 0.5 0.8  PROT 6.8 6.4*  ALBUMIN 3.6 3.3*    Recent Labs Lab 12/30/16 1525  LIPASE 26   No results for input(s): AMMONIA in the last 168 hours.  Cardiac Enzymes: No results for input(s): CKTOTAL, CKMB, CKMBINDEX, TROPONINI in the last 168 hours. BNP (last 3 results) No results for input(s): BNP in the last 8760 hours.  ProBNP (last 3 results) No results for input(s): PROBNP in the last 8760 hours.    Studies: Ct Abdomen Pelvis W Contrast  Result Date: 12/30/2016 CLINICAL DATA:  Abdominal pain. EXAM: CT ABDOMEN AND PELVIS WITH CONTRAST TECHNIQUE: Multidetector CT imaging of the abdomen and pelvis was performed using the standard protocol following bolus administration of intravenous contrast. CONTRAST:  ISOVUE-300 IOPAMIDOL (ISOVUE-300) INJECTION 61% COMPARISON:  07/15/2003. FINDINGS: Lower chest:  Unremarkable Hepatobiliary: No focal abnormality within the liver parenchyma. Gallbladder surgically absent. Common bile duct distended up to 10 mm diameter. This is stable since 07/15/2003. Pancreas: 8 mm cystic lesion identified in the body of pancreas (image 23 series 2). No dilatation of the main duct. Spleen: No splenomegaly. No focal mass lesion. Adrenals/Urinary  Tract: No adrenal nodule or mass. Kidneys are unremarkable. No evidence for hydroureter. The urinary bladder appears normal for the degree of distention. Ring-like calcification identified in the region of the urethra raising the question of stone disease within a urethral diverticulum. Stomach/Bowel: Stomach is nondistended. No gastric wall thickening. No evidence of outlet obstruction. Duodenum is normally positioned as is the ligament of Treitz. No small bowel wall thickening. No small bowel dilatation. The terminal ileum is normal. The appendix is not visualized, but there is no edema or inflammation in the  region of the cecum. Right colon decompressed but otherwise unremarkable. Mild diverticular changes noted left colon without features of diverticulitis. Vascular/Lymphatic: There is abdominal aortic atherosclerosis without aneurysm. There is no gastrohepatic or hepatoduodenal ligament lymphadenopathy. No intraperitoneal or retroperitoneal lymphadenopathy. No pelvic sidewall lymphadenopathy. There is some laminar flow of un opacified blood in the right common femoral vein. Reproductive: Uterus surgically absent.  There is no adnexal mass. Other: No intraperitoneal free fluid. Musculoskeletal: Bone windows reveal no worrisome lytic or sclerotic osseous lesions. Thoracolumbar scoliosis evident. Degenerative changes most advanced at L4-5 in the lumbar spine. IMPRESSION: 1. No acute findings in the abdomen or pelvis. Specifically, no findings to explain the patient's history of abdominal pain. 2. Ring-like calcification around the urethra, likely stone within a urethral diverticulum. Correlation for urinary incontinence/dribbling or recurrent UTI suggested. 3. Extrahepatic common bile duct measures up to 10 mm, unchanged since 2005 and likely related to prior cholecystectomy. 4. Left colonic diverticulosis without diverticulitis. 5.  Aortic Atherosclerois (ICD10-170.0) Electronically Signed   By: Kennith CenterEric  Mansell  M.D.   On: 12/30/2016 20:05    Scheduled Meds: . aspirin EC  81 mg Oral Daily  . atenolol  25 mg Oral Daily  . carbidopa-levodopa  1.5 tablet Oral QID  . cholecalciferol  800 Units Oral Daily  . citalopram  20 mg Oral QHS  . enoxaparin (LOVENOX) injection  30 mg Subcutaneous Q24H  . estrogens (conjugated)  0.625 mg Oral Daily  . feeding supplement (ENSURE ENLIVE)  237 mL Oral BID BM  . [START ON 01/02/2017] levofloxacin  250 mg Oral Daily  . levothyroxine  50 mcg Oral QAC breakfast  . loteprednol  1 drop Right Eye QID  . memantine  10 mg Oral Daily  . pantoprazole  40 mg Oral Daily  . potassium chloride  40 mEq Oral Q4H  . sodium chloride  1 drop Both Eyes QID      Time spent: 20 minutes  [facility],Omega Slager S   [facility] Pager [phone]. If 7PM-7AM, please contact night-coverage at [email or web address], Office  [phone]  password [email or web address]  01/01/2017, 5:25 PM  LOS: 1 day

## 2017-01-01 NOTE — Progress Notes (Signed)
Initial Nutrition Assessment  DOCUMENTATION CODES:   Not applicable  INTERVENTION:   Ensure Enlive po BID, each supplement provides 350 kcal and 20 grams of protein  NUTRITION DIAGNOSIS:   Inadequate oral intake related to decreased appetite as evidenced by per patient/family report.  GOAL:   Patient will meet greater than or equal to 90% of their needs  MONITOR:   PO intake, Labs, Supplement acceptance, Weight trends  REASON FOR ASSESSMENT:   Malnutrition Screening Tool    ASSESSMENT:   Pt with PMH significant for HTN, CKD III, dementia, and Parkinson's. Presents this admission with altered mental status secondary to UTI/dementia.   Spoke with patient at bedside. Reports having poor appetite x4 weeks prior to admission related to abdominal pain upon eating. Pt unable to provide recent dietary recall. Will need to obtain more information from family as pt remains somewhat confused. Spoke with RN, who reports pt is finishing ~75% of meals. Will add ensure for extra protein this hospital stay.   Pt unaware of any unintentional wt loss. Pt's UBW stays around 125 lb. Weight records limited.   Nutrition-Focused physical exam completed. See results below.   Medications reviewed and include: Vit D, KCl Labs reviewed: K 3.0 (L) BUN 26 (H) Creatinine 1.38 (H)  NUTRITION - FOCUSED PHYSICAL EXAM:    Most Recent Value  Orbital Region  No depletion  Upper Arm Region  No depletion  Thoracic and Lumbar Region  Unable to assess  Buccal Region  No depletion  Temple Region  Mild depletion  Clavicle Bone Region  No depletion  Clavicle and Acromion Bone Region  No depletion  Scapular Bone Region  Unable to assess  Dorsal Hand  Moderate depletion  Patellar Region  No depletion  Anterior Thigh Region  No depletion  Posterior Calf Region  No depletion  Edema (RD Assessment)  Mild  Hair  Reviewed  Eyes  Reviewed  Mouth  Reviewed  Skin  Reviewed  Nails  Reviewed       Diet  Order:  Diet regular Room service appropriate? Yes; Fluid consistency: Thin  EDUCATION NEEDS:   Not appropriate for education at this time  Skin:  Skin Assessment: Reviewed RN Assessment  Last BM:  12/31/16  Height:   Ht Readings from Last 1 Encounters:  12/31/16 5\' 3"  (1.6 m)    Weight:   Wt Readings from Last 1 Encounters:  01/01/17 127 lb 13.9 oz (58 kg)    Ideal Body Weight:  56.4 kg  BMI:  Body mass index is 22.65 kg/m.  Estimated Nutritional Needs:   Kcal:  1500-1700 kcal  Protein:  75-85 g/day  Fluid:  >1.5 L/day    Vanessa Kickarly Alvenia Treese RD, LDN Clinical Nutrition Pager # - [phone]

## 2017-01-02 LAB — BASIC METABOLIC PANEL
Anion gap: 8 (ref 5–15)
BUN: 18 mg/dL (ref 6–20)
CALCIUM: 8.6 mg/dL — AB (ref 8.9–10.3)
CO2: 19 mmol/L — ABNORMAL LOW (ref 22–32)
CREATININE: 1.07 mg/dL — AB (ref 0.44–1.00)
Chloride: 109 mmol/L (ref 101–111)
GFR, EST AFRICAN AMERICAN: 53 mL/min — AB (ref >60)
GFR, EST NON AFRICAN AMERICAN: 46 mL/min — AB (ref >60)
Glucose, Bld: 92 mg/dL (ref 65–99)
Potassium: 4.6 mmol/L (ref 3.5–5.1)
SODIUM: 136 mmol/L (ref 135–145)

## 2017-01-02 LAB — URINE CULTURE

## 2017-01-02 MED ORDER — CIPROFLOXACIN HCL 250 MG PO TABS: 250.0000 mg | ORAL_TABLET | Freq: Two times a day (BID) | ORAL | 0 refills | Status: AC

## 2017-01-02 NOTE — Progress Notes (Signed)
Mr Deanna Page called me  Earlier this am and gave me his phone number and asked that I call him when Ms Deanna Page is D/C"D. I just left him a VM at  [phone]

## 2017-01-02 NOTE — Progress Notes (Signed)
D/C?d home via [facility]. AVS and scripts are in the envelope

## 2017-01-02 NOTE — Discharge Summary (Signed)
Physician Discharge Summary  Deanna Page [facility] WUJ:[id number] DOB: [birth year] DOA: 12/30/2016  PCP: Jethro Bastos, MD  Admit date: 12/30/2016 Discharge date: 01/02/2017  Time spent: 25* minutes  Recommendations for Outpatient Follow-up:  1. Follow PCP in 2 weeks   Discharge Diagnoses:  Principal Problem:   Altered mental status Active Problems:   GASTROESOPHAGEAL REFLUX DISEASE   Parkinson's disease (HCC)   UTI (urinary tract infection)   Dementia   Discharge Condition: Stable  Diet recommendation: * heart healthy diet  Filed Weights   12/31/16 0516 01/01/17 0136 01/02/17 0213  Weight: 55 kg (121 lb 4.1 oz) 58 kg (127 lb 13.9 oz) 56.2 kg (124 lb)    History of present illness:  83 y.o.female,w Hypertension, Ckd stage 3, Hypothyroidism, Dementia, Parkinsons, who presents with AMS As well as dysuria x3 weeks per pt. Pt denies fever, chills, flank pain, n/v, diarrhea, brbpr. Pt presented to ED for evaluation.   Hospital Course:  1. Acute encephalopathy-secondary to UTI, resolved.    Patient was started on Levaquin.  Urine cultures growing greater than 100,000 colonies of E. Coli.    Sensitive to ciprofloxacin.  Will discharge on Cipro 250 mg p.o. twice daily for 7 more days. 2. Uretheral stone-CT scan shows ringlike calcification in the urethral diverticulum, Dr. Thedore Mins, [facility] discussed with his urologist Dr. Marlou Porch.  He does not recommend any intervention at this time. 3. Hypothyroidism-continue Synthroid. 4. History of Parkinson disease and dementia-continue Sinemet.   5. GERD-continue Protonix 40 mg p.o. daily 6. Hypertension-continue atenolol 7. Hypokalemia-replaced   Physical therapy evaluation was done and skilled nursing facility was recommended, but family wants to take patient home. We will discharge patient home today.  Procedures:  *none  Consultations:  None  Discharge Exam: Vitals:   01/01/17 2035 01/02/17 0454  BP: (!) 152/89  (!) 142/74  Pulse: 67 78  Resp: 18 18  Temp: 98.7 F (37.1 C) 98.4 F (36.9 C)  SpO2: 98% 98%    General: Appears in no acute distress  Cardiovascular: *S1-S2, regular Respiratory: Clear to auscultation bilaterally  Discharge Instructions   Discharge Instructions    Diet - low sodium heart healthy    Complete by:  As directed    Increase activity slowly    Complete by:  As directed      Current Discharge Medication List    START taking these medications   Details  ciprofloxacin (CIPRO) 250 MG tablet Take 1 tablet (250 mg total) by mouth 2 (two) times daily. Qty: 14 tablet, Refills: 0      CONTINUE these medications which have NOT CHANGED   Details  aspirin 81 MG tablet Take 81 mg by mouth daily.      atenolol (TENORMIN) 25 MG tablet Take 25 mg by mouth daily.      carbidopa-levodopa (SINEMET IR) 25-100 MG tablet Take 1.5 tablets by mouth 4 (four) times daily. Qty: 180 tablet, Refills: 5   Associated Diagnoses: Primary Parkinsonism (HCC)    cholecalciferol (VITAMIN D) 400 units TABS tablet Take 800 Units by mouth daily.    citalopram (CELEXA) 20 MG tablet Take 1 tablet (20 mg total) by mouth at bedtime. Qty: 30 tablet, Refills: 5   Associated Diagnoses: Parkinson's disease (HCC); Anxiety    clonazePAM (KLONOPIN) 0.5 MG tablet Take 0.5 mg by mouth 3 (three) times daily as needed for anxiety.    estrogens, conjugated, (PREMARIN) 0.625 MG tablet Take 0.625 mg by mouth daily. Take daily for 21 days then  do not take for 7 days.    levothyroxine (SYNTHROID, LEVOTHROID) 50 MCG tablet Take 50 mcg by mouth daily before breakfast.    loteprednol (LOTEMAX) 0.5 % ophthalmic suspension Place 1 drop into the right eye 4 (four) times daily.     memantine (NAMENDA) 10 MG tablet Take 10 mg by mouth daily.    pantoprazole (PROTONIX) 40 MG tablet Take 40 mg by mouth daily.    sodium chloride (MURO 128) 2 % ophthalmic solution Place 1 drop into both eyes 4 (four) times daily.     triazolam (HALCION) 0.25 MG tablet Take 0.25 mg by mouth at bedtime as needed for sleep.    rOPINIRole (REQUIP) 0.5 MG tablet Take 1 tablet (0.5 mg total) by mouth 3 (three) times daily. Qty: 90 tablet, Refills: 5   Associated Diagnoses: Parkinson's disease (HCC)    SINGULAIR 10 MG tablet TAKE ONE TABLET BY MOUTH EVERY DAY AS NEEDED Qty: 90 each, Refills: 3       Allergies  Allergen Reactions  . Erythromycin     Other reaction(s): Other (See Comments) Other Reaction: GI Upset Unknown reaction  . Penicillins Rash and Shortness Of Breath    Unknown reaction Has patient had a PCN reaction causing immediate rash, facial/tongue/throat swelling, SOB or lightheadedness with hypotension: Unknown Has patient had a PCN reaction causing severe rash involving mucus membranes or skin necrosis: Unknown Has patient had a PCN reaction that required hospitalization: Unknown Has patient had a PCN reaction occurring within the last 10 years: Unknown If all of the above answers are "NO", then may proceed with Cephalosporin use.   Mart Piggs [Donepezil Hcl]     Unknown reaction  . Doxycycline     Unknown reaction  . Pneumococcal Vaccines     Unknown reaction      The results of significant diagnostics from this hospitalization (including imaging, microbiology, ancillary and laboratory) are listed below for reference.    Significant Diagnostic Studies: Dg Chest 2 View  Result Date: 12/30/2016 CLINICAL DATA:  Altered mental status. History of asthma, hypertension. EXAM: CHEST  2 VIEW COMPARISON:  Chest x-ray of May 22, 2014 FINDINGS: The lungs are adequately inflated. There is no focal infiltrate. The heart and pulmonary vascularity are normal. Coronary artery calcifications are observed in there is calcification in the region of the mitral valvular annulus. There is calcification in the wall of the thoracic aorta. There is no pleural effusion. The bony thorax exhibits no acute abnormality.  IMPRESSION: There is no acute cardiopulmonary abnormality. Thoracic aortic atherosclerosis and coronary artery atherosclerosis. Electronically Signed   By: David  [place] M.D.   On: 12/30/2016 15:28   Ct Head Wo Contrast  Result Date: 12/30/2016 CLINICAL DATA:  Altered level of consciousness. EXAM: CT HEAD WITHOUT CONTRAST TECHNIQUE: Contiguous axial images were obtained from the base of the skull through the vertex without intravenous contrast. COMPARISON:  10/17/2016 FINDINGS: Brain: No evidence of acute infarction, hemorrhage, hydrocephalus, extra-axial collection or mass lesion/mass effect. Prominence of the sulci and ventricles noted compatible with brain atrophy. There is mild diffuse low-attenuation within the subcortical and periventricular white matter compatible with chronic microvascular disease. Vascular: No hyperdense vessel or unexpected calcification. Skull: Normal. Negative for fracture or focal lesion. Sinuses/Orbits: No acute finding. Other: None. IMPRESSION: 1. No acute intracranial abnormalities. 2. Chronic small vessel ischemic change and brain atrophy. Electronically Signed   By: Signa Kell M.D.   On: 12/30/2016 15:47   Ct Abdomen Pelvis W Contrast  Result  Date: 12/30/2016 CLINICAL DATA:  Abdominal pain. EXAM: CT ABDOMEN AND PELVIS WITH CONTRAST TECHNIQUE: Multidetector CT imaging of the abdomen and pelvis was performed using the standard protocol following bolus administration of intravenous contrast. CONTRAST:  100mL ISOVUE-300 IOPAMIDOL (ISOVUE-300) INJECTION 61% COMPARISON:  07/15/2003. FINDINGS: Lower chest:  Unremarkable Hepatobiliary: No focal abnormality within the liver parenchyma. Gallbladder surgically absent. Common bile duct distended up to 10 mm diameter. This is stable since 07/15/2003. Pancreas: 8 mm cystic lesion identified in the body of pancreas (image 23 series 2). No dilatation of the main duct. Spleen: No splenomegaly. No focal mass lesion. Adrenals/Urinary  Tract: No adrenal nodule or mass. Kidneys are unremarkable. No evidence for hydroureter. The urinary bladder appears normal for the degree of distention. Ring-like calcification identified in the region of the urethra raising the question of stone disease within a urethral diverticulum. Stomach/Bowel: Stomach is nondistended. No gastric wall thickening. No evidence of outlet obstruction. Duodenum is normally positioned as is the ligament of Treitz. No small bowel wall thickening. No small bowel dilatation. The terminal ileum is normal. The appendix is not visualized, but there is no edema or inflammation in the region of the cecum. Right colon decompressed but otherwise unremarkable. Mild diverticular changes noted left colon without features of diverticulitis. Vascular/Lymphatic: There is abdominal aortic atherosclerosis without aneurysm. There is no gastrohepatic or hepatoduodenal ligament lymphadenopathy. No intraperitoneal or retroperitoneal lymphadenopathy. No pelvic sidewall lymphadenopathy. There is some laminar flow of un opacified blood in the right common femoral vein. Reproductive: Uterus surgically absent.  There is no adnexal mass. Other: No intraperitoneal free fluid. Musculoskeletal: Bone windows reveal no worrisome lytic or sclerotic osseous lesions. Thoracolumbar scoliosis evident. Degenerative changes most advanced at L4-5 in the lumbar spine. IMPRESSION: 1. No acute findings in the abdomen or pelvis. Specifically, no findings to explain the patient's history of abdominal pain. 2. Ring-like calcification around the urethra, likely stone within a urethral diverticulum. Correlation for urinary incontinence/dribbling or recurrent UTI suggested. 3. Extrahepatic common bile duct measures up to 10 mm, unchanged since 2005 and likely related to prior cholecystectomy. 4. Left colonic diverticulosis without diverticulitis. 5.  Aortic Atherosclerois (ICD10-170.0) Electronically Signed   By: Kennith CenterEric  Mansell  M.D.   On: 12/30/2016 20:05    Microbiology: Recent Results (from the past 240 hour(s))  Urine culture     Status: Abnormal   Collection Time: 12/30/16  3:25 PM  Result Value Ref Range Status   Specimen Description URINE, CLEAN CATCH  Final   Special Requests NONE  Final   Culture >=100,000 COLONIES/mL ESCHERICHIA COLI (A)  Final   Report Status 01/02/2017 FINAL  Final   Organism ID, Bacteria ESCHERICHIA COLI (A)  Final      Susceptibility   Escherichia coli - MIC*    AMPICILLIN >=32 RESISTANT Resistant     CEFAZOLIN <=4 SENSITIVE Sensitive     CEFTRIAXONE <=1 SENSITIVE Sensitive     CIPROFLOXACIN <=0.25 SENSITIVE Sensitive     GENTAMICIN <=1 SENSITIVE Sensitive     IMIPENEM <=0.25 SENSITIVE Sensitive     NITROFURANTOIN <=16 SENSITIVE Sensitive     TRIMETH/SULFA >=320 RESISTANT Resistant     AMPICILLIN/SULBACTAM >=32 RESISTANT Resistant     PIP/TAZO <=4 SENSITIVE Sensitive     Extended ESBL NEGATIVE Sensitive     * >=100,000 COLONIES/mL ESCHERICHIA COLI     Labs: Basic Metabolic Panel:  Recent Labs Lab 12/30/16 1525 12/30/16 1541 12/31/16 0414 01/01/17 0411 01/02/17 0413  NA 141 140 136 135 136  K 4.1 4.0 3.9 3.0* 4.6  CL 103 102 100* 103 109  CO2 31  --  26 21* 19*  GLUCOSE 91 85 92 115* 92  BUN 14 13 19  26* 18  CREATININE 0.91 1.00 1.09* 1.38* 1.07*  CALCIUM 9.1  --  8.9 8.9 8.6*   Liver Function Tests:  Recent Labs Lab 12/30/16 1525 12/31/16 0414  AST 20 21  ALT 13* 16  ALKPHOS 79 73  BILITOT 0.5 0.8  PROT 6.8 6.4*  ALBUMIN 3.6 3.3*    Recent Labs Lab 12/30/16 1525  LIPASE 26   No results for input(s): AMMONIA in the last 168 hours. CBC:  Recent Labs Lab 12/30/16 1525 12/30/16 1541 12/31/16 0414 01/01/17 0411  WBC 6.2  --  7.6 10.5  NEUTROABS 3.4  --   --   --   HGB 11.6* 12.6 11.1* 11.1*  HCT 35.7* 37.0 33.1* 32.4*  MCV 97.0  --  95.4 92.3  PLT 183  --  181 191    CBG:  Recent Labs Lab 12/30/16 1633  GLUCAP 70        Signed:  Burney Calzadilla S MD.  [facility] 01/02/2017, 10:48 AM

## 2017-02-21 ENCOUNTER — Other Ambulatory Visit: Payer: Self-pay

## 2017-02-21 ENCOUNTER — Encounter (HOSPITAL_COMMUNITY): Payer: Self-pay | Admitting: Emergency Medicine

## 2017-02-21 ENCOUNTER — Emergency Department (HOSPITAL_COMMUNITY)
Admission: EM | Admit: 2017-02-21 | Discharge: 2017-02-22 | Disposition: A | Discharge status: Home / Self Care | Payer: Medicare (Managed Care) | Attending: Emergency Medicine | Admitting: Emergency Medicine

## 2017-02-21 DIAGNOSIS — J45909 Unspecified asthma, uncomplicated: Secondary | ICD-10-CM | POA: Insufficient documentation

## 2017-02-21 DIAGNOSIS — F0280 Dementia in other diseases classified elsewhere without behavioral disturbance: Secondary | ICD-10-CM | POA: Diagnosis not present

## 2017-02-21 DIAGNOSIS — Z79899 Other long term (current) drug therapy: Secondary | ICD-10-CM | POA: Diagnosis not present

## 2017-02-21 DIAGNOSIS — G20A1 Parkinson's disease without dyskinesia, without mention of fluctuations: Secondary | ICD-10-CM | POA: Diagnosis present

## 2017-02-21 DIAGNOSIS — E039 Hypothyroidism, unspecified: Secondary | ICD-10-CM | POA: Diagnosis not present

## 2017-02-21 DIAGNOSIS — F0281 Dementia in other diseases classified elsewhere with behavioral disturbance: Secondary | ICD-10-CM | POA: Diagnosis present

## 2017-02-21 DIAGNOSIS — G20 Parkinson's disease: Secondary | ICD-10-CM | POA: Insufficient documentation

## 2017-02-21 DIAGNOSIS — N183 Chronic kidney disease, stage 3 (moderate): Secondary | ICD-10-CM | POA: Insufficient documentation

## 2017-02-21 DIAGNOSIS — F0390 Unspecified dementia without behavioral disturbance: Secondary | ICD-10-CM | POA: Diagnosis present

## 2017-02-21 DIAGNOSIS — Z7982 Long term (current) use of aspirin: Secondary | ICD-10-CM | POA: Diagnosis not present

## 2017-02-21 DIAGNOSIS — Z8673 Personal history of transient ischemic attack (TIA), and cerebral infarction without residual deficits: Secondary | ICD-10-CM | POA: Diagnosis not present

## 2017-02-21 DIAGNOSIS — R45851 Suicidal ideations: Secondary | ICD-10-CM | POA: Diagnosis not present

## 2017-02-21 DIAGNOSIS — I129 Hypertensive chronic kidney disease with stage 1 through stage 4 chronic kidney disease, or unspecified chronic kidney disease: Secondary | ICD-10-CM | POA: Diagnosis not present

## 2017-02-21 DIAGNOSIS — R4587 Impulsiveness: Secondary | ICD-10-CM | POA: Diagnosis not present

## 2017-02-21 DIAGNOSIS — R4182 Altered mental status, unspecified: Secondary | ICD-10-CM | POA: Diagnosis present

## 2017-02-21 LAB — CBC
HCT: 34.7 % — ABNORMAL LOW (ref 36.0–46.0)
Hemoglobin: 11.7 g/dL — ABNORMAL LOW (ref 12.0–15.0)
MCH: 32 pg (ref 26.0–34.0)
MCHC: 33.7 g/dL (ref 30.0–36.0)
MCV: 94.8 fL (ref 78.0–100.0)
PLATELETS: 211 10*3/uL (ref 150–400)
RBC: 3.66 MIL/uL — AB (ref 3.87–5.11)
RDW: 13.8 % (ref 11.5–15.5)
WBC: 10 10*3/uL (ref 4.0–10.5)

## 2017-02-21 LAB — COMPREHENSIVE METABOLIC PANEL
ALT: 6 U/L — ABNORMAL LOW (ref 14–54)
AST: 28 U/L (ref 15–41)
Albumin: 4 g/dL (ref 3.5–5.0)
Alkaline Phosphatase: 87 U/L (ref 38–126)
Anion gap: 9 (ref 5–15)
BUN: 24 mg/dL — ABNORMAL HIGH (ref 6–20)
CHLORIDE: 104 mmol/L (ref 101–111)
CO2: 23 mmol/L (ref 22–32)
Calcium: 9.7 mg/dL (ref 8.9–10.3)
Creatinine, Ser: 1.18 mg/dL — ABNORMAL HIGH (ref 0.44–1.00)
GFR, EST AFRICAN AMERICAN: 47 mL/min — AB (ref >60)
GFR, EST NON AFRICAN AMERICAN: 41 mL/min — AB (ref >60)
Glucose, Bld: 103 mg/dL — ABNORMAL HIGH (ref 65–99)
POTASSIUM: 3.8 mmol/L (ref 3.5–5.1)
SODIUM: 136 mmol/L (ref 135–145)
Total Bilirubin: 0.8 mg/dL (ref 0.3–1.2)
Total Protein: 7.5 g/dL (ref 6.5–8.1)

## 2017-02-21 LAB — ACETAMINOPHEN LEVEL: Acetaminophen (Tylenol), Serum: <10 ug/mL — ABNORMAL LOW (ref 10–30)

## 2017-02-21 LAB — SALICYLATE LEVEL

## 2017-02-21 LAB — ETHANOL

## 2017-02-21 NOTE — ED Notes (Signed)
Bed: WA28 Expected date:  Expected time:  Means of arrival:  Comments: EMS-SI 

## 2017-02-21 NOTE — ED Triage Notes (Signed)
Per EMS, pt was threatening to harm herself with a knife. Husband was able to restrain patient and called EMS. Pt alert, calm, pt arrived tachypnic and states she wants to have her upper teeth evaluated, thinks she is at the dentist.

## 2017-02-21 NOTE — ED Notes (Signed)
Husband at bedside.  

## 2017-02-21 NOTE — ED Provider Notes (Signed)
[facility]-EMERGENCY DEPT Provider Note   CSN: [id number] Arrival date & time: 02/21/17  1626     History   Chief Complaint Chief Complaint  Patient presents with  . Suicidal    HPI Deanna Page is a 83 y.o. female.  [facility]  She has significant dementia in addition to Parkinson's disease.  Patient has had suicidal thoughts and statements.  Reportedly she went into the kitchen to get a knife and her husband states that she was threatening to harm herself.  Her husband reports that he spoke with her caregivers that [facility] and is a recommended the patient be treated in the emergency department. Past Medical History:  Diagnosis Date  . Allergic rhinitis   . Anxiety   . Asthma   . Bowel obstruction (HCC)   . CKD (chronic kidney disease), stage III (HCC)   . Dementia   . Diverticulosis   . Esophageal dysmotility   . Esophageal stricture 2005  . GERD (gastroesophageal reflux disease)   . Hepatitis A   . Hiatal hernia   . Hypertension   . Hypothyroidism   . Internal hemorrhoids   . Irritable bowel syndrome   . Ischemic colitis, enteritis, or enterocolitis (HCC) 2010  . Major depression    with paranoia and insomnia  . OSA (obstructive sleep apnea)   . Parkinson disease (HCC)   . Pneumonia   . TIA (transient ischemic attack)   . Urinary incontinence   . Vitamin D deficiency     Patient Active Problem List   Diagnosis Date Noted  . Altered mental status 12/30/2016  . UTI (urinary tract infection) 12/30/2016  . Dementia 12/30/2016  . Parkinson's disease (HCC) 03/01/2013  . GASTRITIS 02/22/2010  . ABDOMINAL PAIN-EPIGASTRIC 02/22/2010  . DIVERTICULOSIS-COLON 12/19/2008  . OTHER DYSPHAGIA 10/07/2007  . TIA 01/21/2007  . DIARRHEA 01/21/2007  . GAIT DISTURBANCE 01/01/2007  . GASTROESOPHAGEAL REFLUX DISEASE 12/20/2006  . HYSTERECTOMY, HX OF 12/20/2006  . APPENDECTOMY, HX OF 12/20/2006  . SYNCOPE 10/01/2006    . SYMPTOM, MEMORY LOSS 09/16/2006  . BRONCHITIS, ACUTE 06/30/2006  . DEPRESSION 06/02/2006  . HYPERTENSION 06/02/2006  . ALLERGIC RHINITIS 06/02/2006  . ASTHMA 06/02/2006  . PROLAPSE, VAGINAL WALL, RECTOCELE 06/02/2006  . DIZZINESS OR VERTIGO 06/02/2006  . URINARY INCONTINENCE 06/02/2006  . PNEUMONIA, HX OF 06/02/2006  . BLADDER REPAIR, HX OF 06/02/2006  . INSOMNIA, HX OF 06/02/2006    Past Surgical History:  Procedure Laterality Date  . ABDOMINAL HYSTERECTOMY  1969  . APPENDECTOMY  1969  . BLADDER SURGERY    . BREAST LUMPECTOMY  1995  . CATARACT EXTRACTION    . CHOLECYSTECTOMY  2000  . CORNEAL TRANSPLANT Left   . INTRAOCULAR PROSTHESES INSERTION     [facility]  . TUBAL LIGATION  1968    OB History    No data available       Home Medications    Prior to Admission medications   Medication Sig Start Date End Date Taking? Authorizing Provider  acetaminophen (TYLENOL) 325 MG tablet Take 650 mg by mouth 2 (two) times daily as needed for moderate pain.   Yes [provider]  aspirin 81 MG tablet Take 81 mg by mouth daily.     Yes [provider]  atenolol (TENORMIN) 25 MG tablet Take 25 mg by mouth 2 (two) times daily.    Yes [provider]  carbidopa-levodopa (SINEMET IR) 25-100 MG  tablet Take 1.5 tablets by mouth 4 (four) times daily. 04/15/16  Yes Huston FoleyAthar, Saima, MD  Carbidopa-Levodopa ER (SINEMET CR) 25-100 MG tablet controlled release Take 1 tablet by mouth daily.   Yes [provider]  citalopram (CELEXA) 20 MG tablet Take 1 tablet (20 mg total) by mouth at bedtime. 10/09/15  Yes Huston FoleyAthar, Saima, MD  clonazePAM (KLONOPIN) 0.5 MG tablet Take 0.5 mg by mouth 3 (three) times daily.    Yes [provider]  fluorometholone (FML) 0.1 % ophthalmic suspension Place 1 drop into the left eye daily.   Yes [provider]  levothyroxine (SYNTHROID, LEVOTHROID) 50 MCG tablet Take 50 mcg by mouth daily before breakfast.   Yes  [provider]  memantine (NAMENDA) 10 MG tablet Take 10 mg by mouth 2 (two) times daily.    Yes [provider]  Mouthwashes (BIOTENE DRY MOUTH MT) Use as directed in the mouth or throat 2 (two) times daily.   Yes [provider]  Nutritional Supplements (ENSURE CLEAR) LIQD Take 8 oz by mouth every other day.   Yes [provider]  Nutritional Supplements (ENSURE ORIGINAL) LIQD Take 8 oz by mouth every other day.   Yes [provider]  pantoprazole (PROTONIX) 40 MG tablet Take 40 mg by mouth daily.   Yes [provider]  perphenazine (TRILAFON) 4 MG tablet Take 4 mg by mouth 2 (two) times daily.   Yes [provider]  polyvinyl alcohol (LIQUIFILM TEARS) 1.4 % ophthalmic solution Place 1 drop into both eyes 4 (four) times daily as needed for dry eyes.    Yes [provider]  sodium chloride (MURO 128) 2 % ophthalmic solution Place 1 drop into the right eye every 4 (four) hours as needed for eye irritation.    Yes [provider]  Sodium Fluoride (PREVIDENT 5000 BOOSTER PLUS) 1.1 % PSTE Place onto teeth 2 (two) times daily.   Yes [provider]  triazolam (HALCION) 0.25 MG tablet Take 0.125 mg by mouth at bedtime.    Yes [provider]  trimethoprim (TRIMPEX) 100 MG tablet Take 100 mg by mouth daily.   Yes [provider]  Vitamin D, Cholecalciferol, 1000 units CAPS Take 1 capsule by mouth daily.   Yes [provider]  cholecalciferol (VITAMIN D) 400 units TABS tablet Take 800 Units by mouth daily.    [provider]  estrogens, conjugated, (PREMARIN) 0.625 MG tablet Take 0.625 mg by mouth daily. Take daily for 21 days then do not take for 7 days.    [provider]  loteprednol (LOTEMAX) 0.5 % ophthalmic suspension Place 1 drop into the right eye 4 (four) times daily.     [provider]  rOPINIRole (REQUIP) 0.5 MG tablet Take 1 tablet (0.5 mg total) by  mouth 3 (three) times daily. Patient not taking: Reported on 12/30/2016 10/09/15   Huston FoleyAthar, Saima, MD  SINGULAIR 10 MG tablet TAKE ONE TABLET BY MOUTH EVERY DAY AS NEEDED Patient not taking: Reported on 12/30/2016 09/20/10   Donato SchultzLowne Chase, Yvonne R, DO    Family History Family History  Problem Relation Age of Onset  . Stroke Mother   . Parkinson's disease Mother   . Lung cancer Father   . ALS Sister   . Liver disease Sister   . Parkinson's disease Maternal Grandmother   . Parkinson's disease Maternal Aunt   . Emphysema Sister   . Emphysema Brother   . Colon cancer Neg Hx   .  Colon polyps Neg Hx   . Diabetes Neg Hx   . Gallbladder disease Neg Hx   . Esophageal cancer Neg Hx     Social History Social History   Tobacco Use  . Smoking status: Never Smoker  . Smokeless tobacco: Never Used  Substance Use Topics  . Alcohol use: No    Alcohol/week: 0.0 oz  . Drug use: No     Allergies   Erythromycin; Penicillins; Aricept [donepezil hcl]; Clindamycin/lincomycin; Doxycycline; and Pneumococcal vaccines   Review of Systems Review of Systems 10 Systems reviewed and are negative for acute change except as noted in the HPI.   Physical Exam Updated Vital Signs BP (!) 123/54 (BP Location: Left Arm)   Pulse (!) 57   Temp 98.5 F (36.9 C) (Oral)   Resp 20   SpO2 97%   Physical Exam  Constitutional: She appears well-developed and well-nourished. No distress.  HENT:  Head: Normocephalic and atraumatic.  Eyes: Conjunctivae are normal.  Neck: Neck supple.  Cardiovascular: Normal rate and regular rhythm.  No murmur heard. Pulmonary/Chest: Effort normal and breath sounds normal. No respiratory distress.  Abdominal: Soft. There is no tenderness.  Musculoskeletal: She exhibits no edema.  Neurological: She is alert. She exhibits normal muscle tone. Coordination normal.  Skin: Skin is warm and dry.  Psychiatric: She has a normal mood and affect.  Nursing note and vitals  reviewed.    ED Treatments / Results  Labs (all labs ordered are listed, but only abnormal results are displayed) Labs Reviewed  COMPREHENSIVE METABOLIC PANEL - Abnormal; Notable for the following components:      Result Value   Glucose, Bld 103 (*)    BUN 24 (*)    Creatinine, Ser 1.18 (*)    ALT 6 (*)    GFR calc non Af Amer 41 (*)    GFR calc Af Amer 47 (*)    All other components within normal limits  ACETAMINOPHEN LEVEL - Abnormal; Notable for the following components:   Acetaminophen (Tylenol), Serum <10 (*)    All other components within normal limits  CBC - Abnormal; Notable for the following components:   RBC 3.66 (*)    Hemoglobin 11.7 (*)    HCT 34.7 (*)    All other components within normal limits  ETHANOL  SALICYLATE LEVEL  RAPID URINE DRUG SCREEN, HOSP PERFORMED  URINALYSIS, ROUTINE W REFLEX MICROSCOPIC    EKG  EKG Interpretation  Date/Time:  Friday February 21 2017 17:04:05 EST Ventricular Rate:  67 PR Interval:  146 QRS Duration: 78 QT Interval:  446 QTC Calculation: 471 R Axis:   34 Text Interpretation:  Normal sinus rhythm Septal infarct , age undetermined Abnormal ECG normal. prior had artifact Confirmed by Arby Barrette [phone]) on 02/21/2017 6:38:50 PM       Radiology No results found.  Procedures Procedures (including critical care time)  Medications Ordered in ED Medications - No data to display   Initial Impression / Assessment and Plan / ED Course  I have reviewed the triage vital signs and the nursing notes.  Pertinent labs & imaging results that were available during my care of the patient were reviewed by me and considered in my medical decision making (see chart for details).     Consult: TTS.  Final Clinical Impressions(s) / ED Diagnoses   Final diagnoses:  Suicidal ideation  Dementia due to Parkinson's disease without behavioral disturbance [facility])   Patient is alert and appropriate.  At this time  no signs of active  medical problems.  She is medically cleared for psychiatric evaluation. ED Discharge Orders    None       Arby Barrette, MD 02/22/17 [phone]

## 2017-02-21 NOTE — ED Notes (Signed)
TTS assessment in progress. 

## 2017-02-21 NOTE — ED Notes (Signed)
Pt up to restroom, was not able to urinate.

## 2017-02-21 NOTE — BH Assessment (Addendum)
Assessment Note  Deanna Page is an 83 y.o. female, who presents voluntary and accompanied by her husband to [facility] Clinician asked the pt, "what brought you to the hospital?" Pt reported, "my mouth, I have something stuck up.Marland KitchenMarland KitchenI need a dentist." Pt reported, the pain her mouth has been ongoing for five months. Pt reported, for the past couple days she was suicidal because of the pain in her mouth. Pt denied having a plan. Clinician asked the pt's husband what brought his wife to the hospital? Pt's husband reported, the pt wanted someone to check her mouth as she has been complaining about it for a while. Pt's husband reported, the pt took her medication and wanted to take more 40 minutes after. Pt's husband reported, he thought she was looking for a knife. Pt reported, she as looking for her medications. Per pt's husband, the pt told him she wanted to end her life and that is not new. Per pt's husband, she wanted to die. Pt's husband reported, the pt reported she was suicidal 6-8 months ago. Pt's husband reported, 3-4 weeks ago, the pt was looking out her window from her bed and seen people in blankets on their front yard. Pt's husband reported, the pt told him she saw a man standing in front of her window. Pt reported, "he didn't say not to me and I didn't say nothingto him." Pt's husband reported, the pt wanted to see her doctor at [facility]. Pt's husband reported, the pt was transported to the clinic and given medications that did not work. Pt reported, the staff at [facility] wanted the pt to be admitted. Pt denies, SI, HI, AVH, self-injurious behaviors, and access to weapons.   Pt denies, abuse and substance use. Pt's UDS is pending. Pt denies, linked to OPT resources (medication management and/or counseling.) Pt's husband reported, the pt was previously admitted to [facility] in [place] for depression.  Pt presents alert in scrubs with slurred speech. Pt's mood was anxious and sad. Pt's affect was  appropriate to circumstance. Pt's thought process was relevant. Pt's judgement was partial. Pt's insight was poor. Pt's impulse control was fair. Pt was oriented x3. Pt reported, if discharged from [facility] she could contract for safety. Pt reported, if inpatient treatment is recommended she would not sign-in voluntarily.   Diagnosis: F33.2 Major Depressive Disorder, Recurrent episode, Severe with Psychotic Features.   Past Medical History:  Past Medical History:  Diagnosis Date  . Allergic rhinitis   . Anxiety   . Asthma   . Bowel obstruction (HCC)   . CKD (chronic kidney disease), stage III (HCC)   . Dementia   . Diverticulosis   . Esophageal dysmotility   . Esophageal stricture 2005  . GERD (gastroesophageal reflux disease)   . Hepatitis A   . Hiatal hernia   . Hypertension   . Hypothyroidism   . Internal hemorrhoids   . Irritable bowel syndrome   . Ischemic colitis, enteritis, or enterocolitis (HCC) 2010  . Major depression    with paranoia and insomnia  . OSA (obstructive sleep apnea)   . Parkinson disease (HCC)   . Pneumonia   . TIA (transient ischemic attack)   . Urinary incontinence   . Vitamin D deficiency     Past Surgical History:  Procedure Laterality Date  . ABDOMINAL HYSTERECTOMY  1969  . APPENDECTOMY  1969  . BLADDER SURGERY    . BREAST LUMPECTOMY  1995  . CATARACT EXTRACTION    . CHOLECYSTECTOMY  2000  . CORNEAL TRANSPLANT Left   . INTRAOCULAR PROSTHESES INSERTION     [facility]  . TUBAL LIGATION  1968    Family History:  Family History  Problem Relation Age of Onset  . Stroke Mother   . Parkinson's disease Mother   . Lung cancer Father   . ALS Sister   . Liver disease Sister   . Parkinson's disease Maternal Grandmother   . Parkinson's disease Maternal Aunt   . Emphysema Sister   . Emphysema Brother   . Colon cancer Neg Hx   . Colon polyps Neg Hx   . Diabetes Neg Hx   . Gallbladder disease Neg Hx   . Esophageal cancer Neg Hx      Social History:  reports that  has never smoked. she has never used smokeless tobacco. She reports that she does not drink alcohol or use drugs.  Additional Social History:  Alcohol / Drug Use Pain Medications: See MAR  Prescriptions: See MAR  Over the Counter: See MAR History of alcohol / drug use?: (Pt denies. UDS is pending. )  CIWA: CIWA-Ar BP: (!) 114/57 Pulse Rate: 63 COWS:    Allergies:  Allergies  Allergen Reactions  . Erythromycin     Other reaction(s): Other (See Comments) Other Reaction: GI Upset Unknown reaction  . Penicillins Rash and Shortness Of Breath    Unknown reaction Has patient had a PCN reaction causing immediate rash, facial/tongue/throat swelling, SOB or lightheadedness with hypotension: Unknown Has patient had a PCN reaction causing severe rash involving mucus membranes or skin necrosis: Unknown Has patient had a PCN reaction that required hospitalization: Unknown Has patient had a PCN reaction occurring within the last 10 years: Unknown If all of the above answers are "NO", then may proceed with Cephalosporin use.   Mart Piggs. Aricept [Donepezil Hcl]     Unknown reaction  . Clindamycin/Lincomycin Itching  . Doxycycline     Unknown reaction  . Pneumococcal Vaccines     Unknown reaction    Home Medications:  (Not in a hospital admission)  OB/GYN Status:  No LMP recorded. Patient has had a hysterectomy.  General Assessment Data Location of Assessment: [facility] ED TTS Assessment: In system Is this a Tele or Face-to-Face Assessment?: Face-to-Face Is this an Initial Assessment or a Re-assessment for this encounter?: Initial Assessment Marital status: Married Is patient pregnant?: No Pregnancy Status: No Living Arrangements: Spouse/significant other Can pt return to current living arrangement?: Yes Admission Status: Voluntary Is patient capable of signing voluntary admission?: Yes Referral Source: Self/Family/Friend Insurance type: [facility].       Crisis Care Plan Living Arrangements: Spouse/significant other Legal Guardian: Other:(Self.) Name of Psychiatrist: NA Name of Therapist: NA  Education Status Is patient currently in school?: No Current Grade: NA Highest grade of school patient has completed: Pt reported, college.  Name of school: NA Contact person: NA  Risk to self with the past 6 months Is patient at risk for suicide?: Yes Suicidal Plan?: No Has patient had any suicidal plan within the past 6 months prior to admission? : No Access to Means: Yes Specify Access to Suicidal Means: Pt reported, access to knifes.  What has been your use of drugs/alcohol within the last 12 months?: Pt denies. UDS is pending.  Previous Attempts/Gestures: No How many times?: 0 Other Self Harm Risks: Pt denies.  Triggers for Past Attempts: None known Intentional Self Injurious Behavior: None(Pt denies. ) Family Suicide History: No Recent stressful  life event(s): Other (Comment)(Pt reported, her motuh is hurting. ) Persecutory voices/beliefs?: No Depression: No(Pt denies. ) Depression Symptoms: (Pt denies. ) Substance abuse history and/or treatment for substance abuse?: No Suicide prevention information given to non-admitted patients: Not applicable  Risk to Others within the past 6 months Homicidal Ideation: No(Pt denies. ) Does patient have any lifetime risk of violence toward others beyond the six months prior to admission? : No(Pt denies. ) Thoughts of Harm to Others: No(Pt denies. ) Current Homicidal Intent: No Current Homicidal Plan: No Access to Homicidal Means: No Identified Victim: NA History of harm to others?: No Assessment of Violence: None Noted Violent Behavior Description: NA Does patient have access to weapons?: Yes (Comment)(Pt reported, kitchen knives.) Criminal Charges Pending?: No Does patient have a court date: No Is patient on probation?: No  Psychosis Hallucinations: Visual Delusions: None  noted  Mental Status Report Appearance/Hygiene: In scrubs Eye Contact: Good Motor Activity: Tremors Level of Consciousness: Alert Anxiety Level: Minimal Thought Processes: Relevant Judgement: Partial Orientation: Person, Place, Time Obsessive Compulsive Thoughts/Behaviors: None  Cognitive Functioning Concentration: Good Memory: Recent Impaired IQ: Average Insight: Poor Impulse Control: Fair Appetite: Poor Weight Loss: (Pt reported, loosing 17 pounds over the past couple months. ) Sleep: Decreased Total Hours of Sleep: 4 Vegetative Symptoms: None  ADLScreening [facility] Assessment Services) Patient's cognitive ability adequate to safely complete daily activities?: No Patient able to express need for assistance with ADLs?: Yes Independently performs ADLs?: No  Prior Inpatient Therapy Prior Inpatient Therapy: Yes Prior Therapy Dates: Pt's husband reported, in 47. Prior Therapy Facilty/Provider(s): [facility] Reason for Treatment: Depression.  Prior Outpatient Therapy Prior Outpatient Therapy: No Prior Therapy Dates: NA Prior Therapy Facilty/Provider(s): NA Reason for Treatment: NA Does patient have an ACCT team?: No Does patient have Intensive In-House Services?  : No Does patient have [facility] services? : No Does patient have P4CC services?: No  ADL Screening (condition at time of admission) Patient's cognitive ability adequate to safely complete daily activities?: No Is the patient deaf or have difficulty hearing?: No Does the patient have difficulty seeing, even when wearing glasses/contacts?: No Patient able to express need for assistance with ADLs?: Yes Does the patient have difficulty dressing or bathing?: Yes Independently performs ADLs?: No Communication: Independent Dressing (OT): Needs assistance Is this a change from baseline?: Pre-admission baseline Grooming: Needs assistance Is this a change from baseline?: Pre-admission baseline Feeding: Needs  assistance Is this a change from baseline?: Pre-admission baseline Bathing: Needs assistance Is this a change from baseline?: Pre-admission baseline Is this a change from baseline?: Pre-admission baseline In/Out Bed: Needs assistance Is this a change from baseline?: Pre-admission baseline Walks in Home: Needs assistance Is this a change from baseline?: Pre-admission baseline Does the patient have difficulty walking or climbing stairs?: Yes Weakness of Legs: Both(Pt has Parkinson's Disease.) Weakness of Arms/Hands: Both(Pt has Parkinson's Disease.)  Home Assistive Devices/Equipment Home Assistive Devices/Equipment: Wheelchair    Abuse/Neglect Assessment (Assessment to be complete while patient is alone) Abuse/Neglect Assessment Can Be Completed: Yes Physical Abuse: Denies(Pt denies. ) Verbal Abuse: Denies(Pt denies. ) Sexual Abuse: Denies(Pt denies. ) Exploitation of patient/patient's resources: Denies(Pt denies. ) Self-Neglect: Denies(Pt denies. )     Advance Directives (For Healthcare) Does Patient Have a Medical Advance Directive?: No    Additional Information 1:1 In Past 12 Months?: No CIRT Risk: No Elopement Risk: No Does patient have medical clearance?: Yes     Disposition: Nira Conn, NP recommends gero-psychiatric treatment. Disposition discussed with Dr. Donnald Garre and  Consuella LoseElaine, RN. TTS to seek placement.    Disposition Initial Assessment Completed for this Encounter: Yes Disposition of Patient: Inpatient treatment program Type of inpatient treatment program: Adult  On Site Evaluation by:  Jenny Reichmannreylese Samarth Ogle, MS, LPC, CRC. Reviewed with Physician: Dr. Donnald GarrePfeiffer and Nira ConnJason Berry, NP.  Redmond Pullingreylese D Baylee Mccorkel 02/21/2017 8:25 PM   Redmond Pullingreylese D Cadyn Fann, MS, [facility], [facility] Triage Specialist [phone]

## 2017-02-21 NOTE — ED Notes (Signed)
Pt attempts to get out of bed, states she wants to go home. Redirected patient.

## 2017-02-22 DIAGNOSIS — R45851 Suicidal ideations: Secondary | ICD-10-CM | POA: Diagnosis not present

## 2017-02-22 DIAGNOSIS — F0280 Dementia in other diseases classified elsewhere without behavioral disturbance: Secondary | ICD-10-CM

## 2017-02-22 DIAGNOSIS — G20 Parkinson's disease: Secondary | ICD-10-CM | POA: Diagnosis not present

## 2017-02-22 DIAGNOSIS — M2550 Pain in unspecified joint: Secondary | ICD-10-CM

## 2017-02-22 DIAGNOSIS — R4587 Impulsiveness: Secondary | ICD-10-CM

## 2017-02-22 LAB — URINALYSIS, ROUTINE W REFLEX MICROSCOPIC
Bacteria, UA: NONE SEEN
Bilirubin Urine: NEGATIVE
GLUCOSE, UA: NEGATIVE mg/dL
HGB URINE DIPSTICK: NEGATIVE
Ketones, ur: 5 mg/dL — AB
NITRITE: NEGATIVE
PROTEIN: NEGATIVE mg/dL
Specific Gravity, Urine: 1.009 (ref 1.005–1.030)
pH: 7 (ref 5.0–8.0)

## 2017-02-22 LAB — RAPID URINE DRUG SCREEN, HOSP PERFORMED
AMPHETAMINES: NOT DETECTED
Barbiturates: NOT DETECTED
Benzodiazepines: POSITIVE — AB
Cocaine: NOT DETECTED
Opiates: NOT DETECTED
Tetrahydrocannabinol: NOT DETECTED

## 2017-02-22 NOTE — Progress Notes (Signed)
This patient continues to meet inpatient criteria. CSW faxed information to the following facilities:   [place]  [facility]  [address]  [facility]  [facility]  [facility]  Ghalia Reicks, [place] Emergency Room Clinical Social Worker [phone]

## 2017-02-22 NOTE — Discharge Instructions (Signed)
Follow up at:  [facility] [address] [place], [place] [address] Office: [phone] FAX: [phone]-fax [place] [facility]: [phone]

## 2017-02-22 NOTE — [facility] Suicide Risk Assessment (Signed)
Suicide Risk Assessment  Discharge Assessment   [facility]   Principal Problem: Dementia Discharge Diagnoses:  Patient Active Problem List   Diagnosis Date Noted  . Suicidal ideation [R45.851]   . Altered mental status [R41.82] 12/30/2016  . UTI (urinary tract infection) [N39.0] 12/30/2016  . Dementia [F03.90] 12/30/2016  . Parkinson's disease (HCC) [G20] 03/01/2013  . GASTRITIS [K29.70, K29.90] 02/22/2010  . ABDOMINAL PAIN-EPIGASTRIC [R10.13] 02/22/2010  . DIVERTICULOSIS-COLON [K57.30] 12/19/2008  . OTHER DYSPHAGIA [R13.19] 10/07/2007  . TIA [G45.9] 01/21/2007  . DIARRHEA [R19.7] 01/21/2007  . GAIT DISTURBANCE [R26.9] 01/01/2007  . GASTROESOPHAGEAL REFLUX DISEASE [K21.9] 12/20/2006  . HYSTERECTOMY, HX OF [Z90.79] 12/20/2006  . APPENDECTOMY, HX OF [Z90.89] 12/20/2006  . SYNCOPE [R55] 10/01/2006  . SYMPTOM, MEMORY LOSS [R41.3] 09/16/2006  . BRONCHITIS, ACUTE [J20.9] 06/30/2006  . DEPRESSION [F32.9] 06/02/2006  . HYPERTENSION [I10] 06/02/2006  . ALLERGIC RHINITIS [J30.9] 06/02/2006  . ASTHMA [J45.909] 06/02/2006  . PROLAPSE, VAGINAL WALL, RECTOCELE [N81.6] 06/02/2006  . DIZZINESS OR VERTIGO [R42] 06/02/2006  . URINARY INCONTINENCE [R32] 06/02/2006  . PNEUMONIA, HX OF [Z87.09] 06/02/2006  . BLADDER REPAIR, HX OF [Z98.890] 06/02/2006  . INSOMNIA, HX OF [Z91.89] 06/02/2006    Total Time spent with patient: 45 minutes  Musculoskeletal: Strength & Muscle Tone: within normal limits Gait & Station: normal Patient leans: N/A  Psychiatric Specialty Exam: Physical Exam  Constitutional: She is oriented to person, place, and time. She appears well-developed.  HENT:  Head: Normocephalic.  Respiratory: Effort normal.  Neurological: She is alert and oriented to person, place, and time.  Psychiatric: Her speech is normal. She expresses impulsivity. She exhibits abnormal recent memory and abnormal remote memory.   Review of Systems  Musculoskeletal:  Positive for joint pain.  Psychiatric/Behavioral: Positive for depression and memory loss (Parkinson's Disease and Dementia). Negative for hallucinations, substance abuse and suicidal ideas. The patient is not nervous/anxious and does not have insomnia.   All other systems reviewed and are negative.  Blood pressure (!) 149/67, pulse 64, temperature 97.7 F (36.5 C), temperature source Oral, resp. rate 18, SpO2 96 %.There is no height or weight on file to calculate BMI. General Appearance: Casual Eye Contact:  Good Speech:  Clear and Coherent and Slow Volume:  Normal Mood:  Depressed Affect:  Congruent and Depressed Thought Process:  Coherent Orientation:  Full (Time, Place, and Person) Thought Content:  Delusions Suicidal Thoughts:  No Homicidal Thoughts:  No Memory:  Immediate;   Fair Recent;   Fair Remote;   Fair Judgement:  Poor Insight:  Present Psychomotor Activity:  Increased and Tremor Concentration:  Concentration: Fair and Attention Span: Fair Recall:  [employer] of Knowledge:  Good Language:  Good Akathisia:  No Handed:  Right AIMS (if indicated):    Assets:  ArchitectCommunication Skills Financial Resources/Insurance Housing Social Support ADL's:  Intact Cognition:  Impaired,  Moderate     Mental Status Per Nursing Assessment::   On Admission:    Altered mental status  Demographic Factors:  Age [age] or older and Caucasian  Loss Factors: Decline in physical health  Historical Factors: Impulsivity  Risk Reduction Factors:   Sense of responsibility to family and Living with another person, especially a relative  Continued Clinical Symptoms:  Depression:   Impulsivity Medical Diagnoses and Treatments/Surgeries  Cognitive Features That Contribute To Risk:  Loss of executive function  Dementia  Suicide Risk:  Minimal: No identifiable suicidal ideation.  Patients presenting with no risk factors but with morbid ruminations; may  be classified as minimal risk based  on the severity of the depressive symptoms    Plan Of Care/Follow-up recommendations:  Activity:  as tolerated Diet:  Heart Healthy  Deanna AbbeLaurie Britton Klye Besecker, NP 02/22/2017, 12:45 PM

## 2017-02-22 NOTE — Consult Note (Signed)
[facility] Consult   Reason for Consult:  Altered Mental [place] Referring Physician:  EDP Patient Identification: Deanna Page MRN:  [id number] Principal Diagnosis: Dementia Diagnosis:   Patient Active Problem List   Diagnosis Date Noted  . Suicidal ideation [R45.851]   . Altered mental status [R41.82] 12/30/2016  . UTI (urinary tract infection) [N39.0] 12/30/2016  . Dementia [F03.90] 12/30/2016  . Parkinson's disease ([place]) [G20] 03/01/2013  . GASTRITIS [K29.70, K29.90] 02/22/2010  . ABDOMINAL PAIN-EPIGASTRIC [R10.13] 02/22/2010  . DIVERTICULOSIS-COLON [K57.30] 12/19/2008  . OTHER DYSPHAGIA [R13.19] 10/07/2007  . TIA [G45.9] 01/21/2007  . DIARRHEA [R19.7] 01/21/2007  . GAIT DISTURBANCE [R26.9] 01/01/2007  . GASTROESOPHAGEAL REFLUX DISEASE [K21.9] 12/20/2006  . HYSTERECTOMY, HX OF [Z90.79] 12/20/2006  . APPENDECTOMY, HX OF [Z90.89] 12/20/2006  . SYNCOPE [R55] 10/01/2006  . SYMPTOM, MEMORY LOSS [R41.3] 09/16/2006  . BRONCHITIS, ACUTE [J20.9] 06/30/2006  . DEPRESSION [F32.9] 06/02/2006  . HYPERTENSION [I10] 06/02/2006  . ALLERGIC RHINITIS [J30.9] 06/02/2006  . ASTHMA [J45.909] 06/02/2006  . PROLAPSE, VAGINAL WALL, RECTOCELE [N81.6] 06/02/2006  . DIZZINESS OR VERTIGO [R42] 06/02/2006  . URINARY INCONTINENCE [R32] 06/02/2006  . PNEUMONIA, HX OF [Z87.09] 06/02/2006  . BLADDER REPAIR, HX OF [Z98.890] 06/02/2006  . INSOMNIA, HX OF [Z91.89] 06/02/2006    Total Time spent with patient: 45 minutes  Subjective:   Deanna Page is a 83 y.o. female patient admitted with .  HPI:  Pt was seen and chart reviewed with treatment team and Dr Adele Schilder. Pt stated she does not want to hurt herself. Pt has a history of dementia and Parkinson's Disease. Pt lives with her husband. Pt's husband was not present during assessment with this Probation officer and Dr Adele Schilder. Pt receives [facility] via [facility] and is seen by medical personnel there. Pt is oriented to person, place, and  time and is able to answer questions appropriately. Today,  Pt denies suicidal/homicidal ideation, denies auditory/visual hallucinations and does not appear to be responding to internal stimuli. Pt's husband presented later in the AM and was demanding to see a provider. Pt and her husband were seen by TTS and this Probation officer.  Pt's husband stated he has family coming to town over the holidays and wants to take the Pt home so she can see her children. Pt's husband also stated they will have a family conference so they can come up with a new treatment plan. Pt's husband is willing to take responsibility for Pt and is able to contract for Pt's safety upon discharge. Pt is psychiatrically clear for discharge.   Past Psychiatric History: As above  Risk to Self:None Risk to Others: None Prior Inpatient Therapy: Prior Inpatient Therapy: Yes Prior Therapy Dates: Pt's husband reported, in 17. Prior Therapy Facilty/Provider(s): [facility] Reason for Treatment: Depression. Prior Outpatient Therapy: Prior Outpatient Therapy: No Prior Therapy Dates: NA Prior Therapy Facilty/Provider(s): NA Reason for Treatment: NA Does patient have an ACCT team?: No Does patient have Intensive In-House Services?  : No Does patient have [facility] services? : No Does patient have [facility] services?: No  Past Medical History:  Past Medical History:  Diagnosis Date  . Allergic rhinitis   . Anxiety   . Asthma   . Bowel obstruction ([place])   . CKD (chronic kidney disease), stage III ([place])   . Dementia   . Diverticulosis   . Esophageal dysmotility   . Esophageal stricture 2005  . GERD (gastroesophageal reflux disease)   . Hepatitis A   . Hiatal hernia   .  Hypertension   . Hypothyroidism   . Internal hemorrhoids   . Irritable bowel syndrome   . Ischemic colitis, enteritis, or enterocolitis ([place]) 2010  . Major depression    with paranoia and insomnia  . OSA (obstructive sleep apnea)   . Parkinson disease ([place])   .  Pneumonia   . TIA (transient ischemic attack)   . Urinary incontinence   . Vitamin D deficiency     Past Surgical History:  Procedure Laterality Date  . ABDOMINAL HYSTERECTOMY  1969  . APPENDECTOMY  1969  . BLADDER SURGERY    . BREAST LUMPECTOMY  1995  . CATARACT EXTRACTION    . CHOLECYSTECTOMY  2000  . CORNEAL TRANSPLANT Left   . INTRAOCULAR PROSTHESES INSERTION     [facility] eye [facility]  . TUBAL LIGATION  1968   Family History:  Family History  Problem Relation Age of Onset  . Stroke Mother   . Parkinson's disease Mother   . Lung cancer Father   . ALS Sister   . Liver disease Sister   . Parkinson's disease Maternal Grandmother   . Parkinson's disease Maternal Aunt   . Emphysema Sister   . Emphysema Brother   . Colon cancer Neg Hx   . Colon polyps Neg Hx   . Diabetes Neg Hx   . Gallbladder disease Neg Hx   . Esophageal cancer Neg Hx    Family Psychiatric  History: Unknown Social History:  Social History   Substance and Sexual Activity  Alcohol Use No  . Alcohol/week: 0.0 oz     Social History   Substance and Sexual Activity  Drug Use No    Social History   Socioeconomic History  . Marital status: Married    Spouse name: Herbie Baltimore  . Number of children: 3  . Years of education: 37  . Highest education level: None  Social Needs  . Financial resource strain: None  . Food insecurity - worry: None  . Food insecurity - inability: None  . Transportation needs - medical: None  . Transportation needs - non-medical: None  Occupational History  . Occupation: Retired    Fish farm manager: RETIRED  Tobacco Use  . Smoking status: Never Smoker  . Smokeless tobacco: Never Used  Substance and Sexual Activity  . Alcohol use: No    Alcohol/week: 0.0 oz  . Drug use: No  . Sexual activity: None  Other Topics Concern  . None  Social History Narrative   Patient is married Herbie Baltimore) and lives with her husband.   Patient has three children.   Patient is retired.   Patient has an  high school education.   Patient does drink caffeine beverages.   Patient is left handed.         Additional Social History:    Allergies:   Allergies  Allergen Reactions  . Erythromycin     Other reaction(s): Other (See Comments) Other Reaction: GI Upset Unknown reaction  . Penicillins Rash and Shortness Of Breath    Unknown reaction Has patient had a PCN reaction causing immediate rash, facial/tongue/throat swelling, SOB or lightheadedness with hypotension: Unknown Has patient had a PCN reaction causing severe rash involving mucus membranes or skin necrosis: Unknown Has patient had a PCN reaction that required hospitalization: Unknown Has patient had a PCN reaction occurring within the last 10 years: Unknown If all of the above answers are "NO", then may proceed with Cephalosporin use.   [name] [Donepezil Hcl]     Unknown  reaction  . Clindamycin/Lincomycin Itching  . Doxycycline     Unknown reaction  . Pneumococcal Vaccines     Unknown reaction    Labs:  Results for orders placed or performed during the hospital encounter of 02/21/17 (from the past 48 hour(s))  Comprehensive metabolic panel     Status: Abnormal   Collection Time: 02/21/17  6:33 PM  Result Value Ref Range   Sodium 136 135 - 145 mmol/L   Potassium 3.8 3.5 - 5.1 mmol/L   Chloride 104 101 - 111 mmol/L   CO2 23 22 - 32 mmol/L   Glucose, Bld 103 (H) 65 - 99 mg/dL   BUN 24 (H) 6 - 20 mg/dL   Creatinine, Ser 1.18 (H) 0.44 - 1.00 mg/dL   Calcium 9.7 8.9 - 10.3 mg/dL   Total Protein 7.5 6.5 - 8.1 g/dL   Albumin 4.0 3.5 - 5.0 g/dL   AST 28 15 - 41 U/L   ALT 6 (L) 14 - 54 U/L   Alkaline Phosphatase 87 38 - 126 U/L   Total Bilirubin 0.8 0.3 - 1.2 mg/dL   GFR calc non Af Amer 41 (L) >60 mL/min   GFR calc Af Amer 47 (L) >60 mL/min    Comment: (NOTE) The eGFR has been calculated using the CKD EPI equation. This calculation has not been validated in all clinical situations. eGFR's persistently <60 mL/min  signify possible Chronic Kidney Disease.    Anion gap 9 5 - 15  Ethanol     Status: None   Collection Time: 02/21/17  6:33 PM  Result Value Ref Range   Alcohol, Ethyl (B) <10 <10 mg/dL    Comment:        LOWEST DETECTABLE LIMIT FOR SERUM ALCOHOL IS 10 mg/dL FOR MEDICAL PURPOSES ONLY   Salicylate level     Status: None   Collection Time: 02/21/17  6:33 PM  Result Value Ref Range   Salicylate Lvl <5.9 2.8 - 30.0 mg/dL  Acetaminophen level     Status: Abnormal   Collection Time: 02/21/17  6:33 PM  Result Value Ref Range   Acetaminophen (Tylenol), Serum <10 (L) 10 - 30 ug/mL    Comment:        THERAPEUTIC CONCENTRATIONS VARY SIGNIFICANTLY. A RANGE OF 10-30 ug/mL MAY BE AN EFFECTIVE CONCENTRATION FOR MANY PATIENTS. HOWEVER, SOME ARE BEST TREATED AT CONCENTRATIONS OUTSIDE THIS RANGE. ACETAMINOPHEN CONCENTRATIONS >150 ug/mL AT 4 HOURS AFTER INGESTION AND >50 ug/mL AT 12 HOURS AFTER INGESTION ARE OFTEN ASSOCIATED WITH TOXIC REACTIONS.   cbc     Status: Abnormal   Collection Time: 02/21/17  6:33 PM  Result Value Ref Range   WBC 10.0 4.0 - 10.5 K/uL   RBC 3.66 (L) 3.87 - 5.11 MIL/uL   Hemoglobin 11.7 (L) 12.0 - 15.0 g/dL   HCT 34.7 (L) 36.0 - 46.0 %   MCV 94.8 78.0 - 100.0 fL   MCH 32.0 26.0 - 34.0 pg   MCHC 33.7 30.0 - 36.0 g/dL   RDW 13.8 11.5 - 15.5 %   Platelets 211 150 - 400 K/uL  Rapid urine drug screen (hospital performed)     Status: Abnormal   Collection Time: 02/22/17 12:08 AM  Result Value Ref Range   Opiates NONE DETECTED NONE DETECTED   Cocaine NONE DETECTED NONE DETECTED   Benzodiazepines POSITIVE (A) NONE DETECTED   Amphetamines NONE DETECTED NONE DETECTED   Tetrahydrocannabinol NONE DETECTED NONE DETECTED   Barbiturates NONE DETECTED NONE DETECTED  Comment: (NOTE) DRUG SCREEN FOR MEDICAL PURPOSES ONLY.  IF CONFIRMATION IS NEEDED FOR ANY PURPOSE, NOTIFY LAB WITHIN 5 DAYS. LOWEST DETECTABLE LIMITS FOR URINE DRUG SCREEN Drug Class                      Cutoff (ng/mL) Amphetamine and metabolites    1000 Barbiturate and metabolites    200 Benzodiazepine                 631 Tricyclics and metabolites     300 Opiates and metabolites        300 Cocaine and metabolites        300 THC                            50   Urinalysis, Routine w reflex microscopic     Status: Abnormal   Collection Time: 02/22/17 12:08 AM  Result Value Ref Range   Color, Urine YELLOW YELLOW   APPearance CLEAR CLEAR   Specific Gravity, Urine 1.009 1.005 - 1.030   pH 7.0 5.0 - 8.0   Glucose, UA NEGATIVE NEGATIVE mg/dL   Hgb urine dipstick NEGATIVE NEGATIVE   Bilirubin Urine NEGATIVE NEGATIVE   Ketones, ur 5 (A) NEGATIVE mg/dL   Protein, ur NEGATIVE NEGATIVE mg/dL   Nitrite NEGATIVE NEGATIVE   Leukocytes, UA TRACE (A) NEGATIVE   RBC / HPF 0-5 0 - 5 RBC/hpf   WBC, UA 0-5 0 - 5 WBC/hpf   Bacteria, UA NONE SEEN NONE SEEN   Squamous Epithelial / LPF 0-5 (A) NONE SEEN   Hyaline Casts, UA PRESENT     No current facility-administered medications for this encounter.    Current Outpatient Medications  Medication Sig Dispense Refill  . acetaminophen (TYLENOL) 325 MG tablet Take 650 mg by mouth 2 (two) times daily as needed for moderate pain.    Marland Kitchen aspirin 81 MG tablet Take 81 mg by mouth daily.      Marland Kitchen atenolol (TENORMIN) 25 MG tablet Take 25 mg by mouth 2 (two) times daily.     . carbidopa-levodopa (SINEMET IR) 25-100 MG tablet Take 1.5 tablets by mouth 4 (four) times daily. 180 tablet 5  . Carbidopa-Levodopa ER (SINEMET CR) 25-100 MG tablet controlled release Take 1 tablet by mouth daily.    . citalopram (CELEXA) 20 MG tablet Take 1 tablet (20 mg total) by mouth at bedtime. 30 tablet 5  . clonazePAM (KLONOPIN) 0.5 MG tablet Take 0.5 mg by mouth 3 (three) times daily.     . fluorometholone (FML) 0.1 % ophthalmic suspension Place 1 drop into the left eye daily.    Marland Kitchen levothyroxine (SYNTHROID, LEVOTHROID) 50 MCG tablet Take 50 mcg by mouth daily before breakfast.    .  memantine (NAMENDA) 10 MG tablet Take 10 mg by mouth 2 (two) times daily.     . Mouthwashes (BIOTENE DRY MOUTH MT) Use as directed in the mouth or throat 2 (two) times daily.    . Nutritional Supplements (ENSURE CLEAR) LIQD Take 8 oz by mouth every other day.    . Nutritional Supplements (ENSURE ORIGINAL) LIQD Take 8 oz by mouth every other day.    . pantoprazole (PROTONIX) 40 MG tablet Take 40 mg by mouth daily.    Marland Kitchen perphenazine (TRILAFON) 4 MG tablet Take 4 mg by mouth 2 (two) times daily.    . polyvinyl alcohol (LIQUIFILM TEARS) 1.4 % ophthalmic solution Place 1  drop into both eyes 4 (four) times daily as needed for dry eyes.     . sodium chloride (MURO 128) 2 % ophthalmic solution Place 1 drop into the right eye every 4 (four) hours as needed for eye irritation.     . Sodium Fluoride (PREVIDENT 5000 BOOSTER PLUS) 1.1 % PSTE Place onto teeth 2 (two) times daily.    . triazolam (HALCION) 0.25 MG tablet Take 0.125 mg by mouth at bedtime.     Marland Kitchen trimethoprim (TRIMPEX) 100 MG tablet Take 100 mg by mouth daily.    . Vitamin D, Cholecalciferol, 1000 units CAPS Take 1 capsule by mouth daily.    . cholecalciferol (VITAMIN D) 400 units TABS tablet Take 800 Units by mouth daily.    Marland Kitchen estrogens, conjugated, (PREMARIN) 0.625 MG tablet Take 0.625 mg by mouth daily. Take daily for 21 days then do not take for 7 days.    Marland Kitchen loteprednol (LOTEMAX) 0.5 % ophthalmic suspension Place 1 drop into the right eye 4 (four) times daily.     Marland Kitchen rOPINIRole (REQUIP) 0.5 MG tablet Take 1 tablet (0.5 mg total) by mouth 3 (three) times daily. (Patient not taking: Reported on 12/30/2016) 90 tablet 5  . SINGULAIR 10 MG tablet TAKE ONE TABLET BY MOUTH EVERY DAY AS NEEDED (Patient not taking: Reported on 12/30/2016) 90 each 3    Musculoskeletal: Strength & Muscle Tone: within normal limits Gait & Station: normal Patient leans: N/A  Psychiatric Specialty Exam: Physical Exam  Constitutional: She is oriented to person, place,  and time. She appears well-developed.  HENT:  Head: Normocephalic.  Respiratory: Effort normal.  Neurological: She is alert and oriented to person, place, and time.  Psychiatric: Her speech is normal. She expresses impulsivity. She exhibits abnormal recent memory and abnormal remote memory.    Review of Systems  Musculoskeletal: Positive for joint pain.  Psychiatric/Behavioral: Positive for depression and memory loss (Parkinson's Disease and Dementia). Negative for hallucinations, substance abuse and suicidal ideas. The patient is not nervous/anxious and does not have insomnia.   All other systems reviewed and are negative.   Blood pressure (!) 149/67, pulse 64, temperature 97.7 F (36.5 C), temperature source Oral, resp. rate 18, SpO2 96 %.There is no height or weight on file to calculate BMI.  General Appearance: Casual  Eye Contact:  Good  Speech:  Clear and Coherent and Slow  Volume:  Normal  Mood:  Depressed  Affect:  Congruent and Depressed  Thought Process:  Coherent  Orientation:  Full (Time, Place, and Person)  Thought Content:  Delusions  Suicidal Thoughts:  No  Homicidal Thoughts:  No  Memory:  Immediate;   Fair Recent;   Fair Remote;   Fair  Judgement:  Poor  Insight:  Present  Psychomotor Activity:  Increased and Tremor  Concentration:  Concentration: Fair and Attention Span: Fair  Recall:  [employer] of Knowledge:  Good  Language:  Good  Akathisia:  No  Handed:  Right  AIMS (if indicated):     Assets:  Agricultural consultant Housing Social Support  ADL's:  Intact  Cognition:  Impaired,  Moderate  Sleep:        Treatment Plan Summary: Plan Dementia  Discharge Home Take all medications as prescribed Follow up with [facility] for continued therapy and medical support  Disposition: No evidence of imminent risk to self or others at present.   Patient does not meet criteria for psychiatric inpatient admission. Supportive therapy  provided about  ongoing stressors. Discussed crisis plan, support from social network, calling 911, coming to the Emergency Department, and calling Suicide Hotline.  Ethelene Hal, NP 02/22/2017 12:15 PM

## 2017-02-22 NOTE — ED Provider Notes (Signed)
Called to bedside: pt apparently c/o teeth pain for 5+ months. When asked where her pain is specifically, she replies "all over."  PE as below. No emergent dental issue at this time. Tx symptomatically, f/u Dentist as outpatient.    Physical examination:  Constitutional: Well developed, Well nourished, Well hydrated, In no acute distress; Head and Face: Normocephalic, Atraumatic; ENMT: Mouth and pharynx normal, Multiple missing teeth. Crowns on most teeth with underlying poor dentition and widespread dental decay, Mucous membranes moist,  No gingival erythema, edema, fluctuance, or drainage.  No intra-oral edema. No submandibular or sublingual edema. No hoarse voice, no drooling, no stridor. No trismus. ; Neck: Supple, Full range of motion, No lymphadenopathy.    Samuel JesterMcManus, Johndavid Geralds, DO 02/22/17 1034

## 2017-03-11 ENCOUNTER — Ambulatory Visit: Payer: Medicare (Managed Care) | Admitting: Neurology

## 2017-04-17 ENCOUNTER — Encounter: Payer: Self-pay | Admitting: Neurology

## 2017-04-17 ENCOUNTER — Ambulatory Visit (INDEPENDENT_AMBULATORY_CARE_PROVIDER_SITE_OTHER): Payer: Medicare (Managed Care) | Admitting: Neurology

## 2017-04-17 VITALS — BP 126/68 | HR 68

## 2017-04-17 DIAGNOSIS — G20 Parkinson's disease: Secondary | ICD-10-CM

## 2017-04-17 NOTE — Patient Instructions (Addendum)
We will continue with the Sinemet at 1 1/2 pills 4 times a day.   Constipation can be a big problem. It is important to be proactive: this includes ensuring adequate water intake and mobilization (walking around), utilizing stool softeners as needed, an over-the-counter laxative as needed up to daily if needed, adding a probiotic in pill form or in the form of yogurt can help as well. Sometimes, using a suppository or enema becomes necessary.  You can supplement your diet with Ensure or boost.    I would like to come back in 6 months, you can see one of our nurse practitioners.    .Marland Kitchen

## 2017-04-17 NOTE — Progress Notes (Signed)
Subjective:    Patient ID: Deanna Page is a 84 y.o. female.  HPI     Interim history:   Deanna Page is an 84 year old left-handed woman with an underlying medical history of allergic rhinitis, asthma, depression, hypertension, reflux disease, history of TIA, diverticulosis, hypothyroidism, obstructive sleep apnea and colitis, who presents for follow-up consultation of her parkinsonism. The patient is accompanied by her husband again  today. I last saw her on 04/15/2016, at which time she reported that the medication was not helpful, I did suggest that she increase her Sinemet to some degree. She was having some issues with constipation, and help at home through a home health agency. I suggested a second opinion. She saw Dr. Tonye Royalty at [facility] in the interim in July 2018 and had a subsequent follow-up with his nurse practitioner on 12/24/2016, at which time she was advised to continue with Sinemet 2 tablets 3 times a day. She was off of Requip at the time, she declined referral to PT, OT and ST. She had an interim brain MRI without contrast through her primary care physician which I reviewed: IMPRESSION: No acute or reversible finding. Atrophy an extensive chronic small vessel ischemic changes throughout the brain as outlined above. Volume loss in the substantia nigra regions consistent with the clinical diagnosis of Parkinson's disease.  Her husband requested a referral to the [facility] but they ended up not pursuing this as I understand.    Today, 04/17/2017: She reports very little today, Denies any pain, feels like her appetite is okay, no recent falls, does not wish to go back to [facility]. She is off of Requip, takes Sinemet immediate release 1-1/2 pills 4 times a day, no longer there long-acting at night. Her husband reports that she drinks plenty of water but she does cough with swallowing and that he has to cut up her food and give her softer food. She has constipation, she can go  2 or 3 days without going to the bathroom. They have MiraLAX at the house but they have not been using it on a regular basis.   The patient's allergies, current medications, family history, past medical history, past social history, past surgical history and problem list were reviewed and updated as appropriate.    Previously (copied from previous notes for reference):    I saw her on 10/09/2015, at which time she reported more difficulty with her balance. Her walking was worse. She had trouble feeding herself. She was on ropinirole 0.5 mg 3 times a day and generic Sinemet 1 pill 3 times a day, she was not starting her first dose in the morning as she was sleeping in late every day, she had constipation, erratic bowel movements. Her husband was working 3 days a week, she had held 3 days week from 10:30 AM to 4 PM. She was typically not getting her first dose of Sinemet before noon. Her MMSE was 26 out of 30, clock drawing was 1 out of 4, animal fluency 9/m. She was encouraged to drink more water, change her sleep schedule and start the Sinemet earlier in the morning and increase it to 4 times a day at 9 AM, 1 PM, 5 PM and 9 PM. She was encouraged to continue with Requip 0.5 mg 3 times a day and Celexa 20 mg daily.    I saw her on 04/11/2015, at which time she reported feeling stable for the most part, some increase in anxiety, no recent falls, memory,  per husband, was stable. She denied overt depression, had been on the same dose of Celexa for years. She was on Sinemet 4 times a day, usually around 10 AM, 2 PM, 5 PM and 9 PM and Requip 0.5 mg 3 times a day. For occasional constipation, she was on Dulcolax. She was using her wheelchair or rolling walker. Her MMSE was 24/30, CDT: 1/4, AFT: 8/min at the time. I suggested we increase her Celexa to 20 mg. She was on Namenda generic twice daily.    I saw her on 12/12/2014, at which time she reported doing fairly. Her husband was working 3 days a week, on  Tuesdays, Wednesdays and Thursdays. She had an aid on those days. She had no recent falls, thankfully. She was on Sinemet about 3 times a day. Her first dose may not be until noon as she was sleeping in late, till about 10:30 AM. She was going t bed around 10:30 PM but was up multiple times a night. She was skipping breakfast and was not drinking enough. I suggested she increase Sinemet to 4 times a day, 10:30, 1:30 PM, 5:30 PM and 7:30 PM. I asked her to continue Requip 0.5 mg 3 times a day.    I first met her on 08/17/2014, at which time she reported no significant improvement of her symptoms on her medications. She had tried Neupro patch 6 mg in the past but this was stopped because it did not help. She was using hydrocodone at night for sleep and had tried sleeping without it but could not sleep. She was taking half a pill at night. She was not taking the Sinemet or Requip the way it had been suggested by Dr. [name] and was unclear why changes had not been implemented. She was on Sinemet 25-100 milligrams strength one pill twice daily at 10 AM and 2 PM and she would take a third pill sometimes around 6 PM. She did not think was very helpful. She was taking 0.5 mg of Requip twice daily. She had been advised to increase Sinemet to 1-1/2 pills 4 times a day and increase Requip to 1 pill 3 times a day in August 2015. She was no longer on Artane for unclear reasons.   She had seen Dr. [name] in the past and also Dr. [name] in this office. Symptoms go back to about 5 years ago when she started having tremors and gait disorder as well as problems getting in and out of chairs. At the house she uses a 4 wheeled walker. She reported that she typically skipped breakfast. I suggested that we increase Sinemet to 1 pill 4 times a day. I asked her to increase Requip 0.5 mg to 1 pill 3 times a day. She was advised to drink more water and to not skip any meals.   She previously saw Dr. [name] and before then she saw  Dr. [name] in our office. I reviewed Dr. Hazle Quant note from 10/04/2013, which is summarized below.    (10/04/2013, Dr. Wilber Bihari):  "HPI:  EMAAN GARY is a 84 y.o. female here for a PD follow up. Was followed by Dr [name] in the past. Last visit was 05/2013 at which time she was started on Neupro patch. They note they stopped this a few days after she started and went back to Requip. Unclear why it was stopped, they are also unsure what dose of Requip she is currently on.  Currently taking Sinemet 25-100 TID, they note she  takes it at "various times". Takes the 1st dose around 11, takes next dose at 4pm, takes the last dose at bedtime. Notes she forgets to take it a few times a week. Often takes it on a different schedule. Notes improvement when taking this medication, feels more relaxed. Has decreased tremor and muscle shaking. Main concern today is increased shaking and severe anxiety.    Initial visit 02/2013 (Dr. Wilber Bihari): Diagnosed with PD around 4 to 5 years ago. Initially noted rest tremor and difficulty walking. Predominantly a rest tremor but has mild action/postural component. Notes muscle stiffness. Notes some generalized bradykinesia, some shuffling of gait. No falls recently. Has some RBD. Has some constipation. No memory troubles, no hallucinations. No difficulty with sense of smell. Currently taking Requip 51m 3x a day, has been on this regimen since diagnosis. Also taking Artane 225mdaily. Tried Sinemet in the past, unsure if it helped but noted it caused a lot of problems.   Current main concerns with PD are tremors, bradykinesia and gait difficulty. Has family history of PD."    Her Past Medical History Is Significant For: Past Medical History:  Diagnosis Date  . Allergic rhinitis   . Anxiety   . Asthma   . Bowel obstruction ([place]  . CKD (chronic kidney disease), stage III ([facility]  . Dementia   . Diverticulosis   . Esophageal dysmotility   . Esophageal stricture 2005  . GERD  (gastroesophageal reflux disease)   . Hepatitis A   . Hiatal hernia   . Hypertension   . Hypothyroidism   . Internal hemorrhoids   . Irritable bowel syndrome   . Ischemic colitis, enteritis, or enterocolitis ([facility]2010  . Major depression    with paranoia and insomnia  . OSA (obstructive sleep apnea)   . Parkinson disease ([facility]  . Pneumonia   . TIA (transient ischemic attack)   . Urinary incontinence   . Vitamin D deficiency     Her Past Surgical History Is Significant For: Past Surgical History:  Procedure Laterality Date  . ABDOMINAL HYSTERECTOMY  1969  . APPENDECTOMY  1969  . BLADDER SURGERY    . BREAST LUMPECTOMY  1995  . CATARACT EXTRACTION    . CHOLECYSTECTOMY  2000  . CORNEAL TRANSPLANT Left   . INTRAOCULAR PROSTHESES INSERTION     [facility] TUBAL [facility]IG[facility]  1968    Her Family History Is Significant For: Family History  Problem Relation Age of Onset  . Stroke Mother   . Parkinson's disease Mother   . Lung cancer Father   . ALS Sister   . Liver disease Sister   . Parkinson's disease Maternal Grandmother   . Parkinson's disease Maternal Aunt   . Emphysema Sister   . Emphysema Brother   . Colon cancer Neg Hx   . Colon polyps Neg Hx   . Diabetes Neg Hx   . Gallbladder disease Neg Hx   . Esophageal cancer Neg Hx     Her Social History Is Significant For: Social History   Socioeconomic History  . Marital status: Married    Spouse name: RoHerbie Baltimore. Number of children: 3  . Years of education: 1253. Highest education level: None  Social Needs  . Financial resource strain: None  . Food insecurity - worry: None  . Food insecurity - inability: None  . Transportation needs - medical: None  . Transportation needs - non-medical: None  Occupational History  .  Occupation: Retired    Fish farm manager: RETIRED  Tobacco Use  . Smoking status: Never Smoker  . Smokeless tobacco: Never Used  Substance and Sexual Activity  . Alcohol use: No    Alcohol/week:  0.0 oz  . Drug use: No  . Sexual activity: None  Other Topics Concern  . None  Social History Narrative   Patient is married Herbie Baltimore) and lives with her husband.   Patient has three children.   Patient is retired.   Patient has an high school education.   Patient does drink caffeine beverages.   Patient is left handed.          Her Allergies Are:  Allergies  Allergen Reactions  . Erythromycin     Other reaction(s): Other (See Comments) Other Reaction: GI Upset Unknown reaction  . Penicillins Rash and Shortness Of Breath    Unknown reaction Has patient had a PCN reaction causing immediate rash, facial/tongue/throat swelling, SOB or lightheadedness with hypotension: Unknown Has patient had a PCN reaction causing severe rash involving mucus membranes or skin necrosis: Unknown Has patient had a PCN reaction that required hospitalization: Unknown Has patient had a PCN reaction occurring within the last 10 years: Unknown If all of the above answers are "NO", then may proceed with Cephalosporin use.   [name] [Donepezil Hcl]     Unknown reaction  . Clindamycin/Lincomycin Itching  . Doxycycline     Unknown reaction  . Pneumococcal Vaccines     Unknown reaction  :   Her Current Medications Are:  Outpatient Encounter Medications as of 04/17/2017  Medication Sig  . acetaminophen (TYLENOL) 325 MG tablet Take 650 mg by mouth 2 (two) times daily as needed for moderate pain.  Marland Kitchen atenolol (TENORMIN) 25 MG tablet Take 25 mg by mouth 2 (two) times daily.   . carbidopa-levodopa (SINEMET IR) 25-100 MG tablet Take 1.5 tablets by mouth 4 (four) times daily.  . cholecalciferol (VITAMIN D) 400 units TABS tablet Take 800 Units by mouth daily.  . citalopram (CELEXA) 20 MG tablet Take 1 tablet (20 mg total) by mouth at bedtime.  . clonazePAM (KLONOPIN) 0.5 MG tablet Take 0.5 mg by mouth 3 (three) times daily.   Marland Kitchen estrogens, conjugated, (PREMARIN) 0.625 MG tablet Take 0.625 mg by mouth daily.  Take daily for 21 days then do not take for 7 days.  . fluorometholone (FML) 0.1 % ophthalmic suspension Place 1 drop into the left eye daily.  Marland Kitchen levothyroxine (SYNTHROID, LEVOTHROID) 50 MCG tablet Take 50 mcg by mouth daily before breakfast.  . memantine (NAMENDA) 10 MG tablet Take 10 mg by mouth 2 (two) times daily.   . Mouthwashes (BIOTENE DRY MOUTH MT) Use as directed in the mouth or throat 2 (two) times daily.  . Nutritional Supplements (ENSURE ORIGINAL) LIQD Take 8 oz by mouth every other day.  . pantoprazole (PROTONIX) 40 MG tablet Take 40 mg by mouth daily.  Marland Kitchen perphenazine (TRILAFON) 4 MG tablet Take 4 mg by mouth 2 (two) times daily.  . polyvinyl alcohol (LIQUIFILM TEARS) 1.4 % ophthalmic solution Place 1 drop into both eyes 4 (four) times daily as needed for dry eyes.   . triazolam (HALCION) 0.25 MG tablet Take 0.125 mg by mouth at bedtime.   Marland Kitchen trimethoprim (TRIMPEX) 100 MG tablet Take 100 mg by mouth daily.  . Vitamin D, Cholecalciferol, 1000 units CAPS Take 1 capsule by mouth daily.  . Carbidopa-Levodopa ER (SINEMET CR) 25-100 MG tablet controlled release Take 1 tablet by  mouth daily.  . [DISCONTINUED] aspirin 81 MG tablet Take 81 mg by mouth daily.    . [DISCONTINUED] loteprednol (LOTEMAX) 0.5 % ophthalmic suspension Place 1 drop into the right eye 4 (four) times daily.   . [DISCONTINUED] Nutritional Supplements (ENSURE CLEAR) LIQD Take 8 oz by mouth every other day.  . [DISCONTINUED] rOPINIRole (REQUIP) 0.5 MG tablet Take 1 tablet (0.5 mg total) by mouth 3 (three) times daily. (Patient not taking: Reported on 12/30/2016)  . [DISCONTINUED] SINGULAIR 10 MG tablet TAKE ONE TABLET BY MOUTH EVERY DAY AS NEEDED (Patient not taking: Reported on 12/30/2016)  . [DISCONTINUED] sodium chloride (MURO 128) 2 % ophthalmic solution Place 1 drop into the right eye every 4 (four) hours as needed for eye irritation.   . [DISCONTINUED] Sodium Fluoride (PREVIDENT 5000 BOOSTER PLUS) 1.1 % PSTE Place onto  teeth 2 (two) times daily.   No facility-administered encounter medications on file as of 04/17/2017.   : Review of Systems:  Out of a complete 14 point review of systems, all are reviewed and negative with the exception of these symptoms as listed below:  Review of Systems  Neurological:       Pt presents today to discuss her PD.    Objective:  Neurological Exam  Physical Exam Physical Examination:   Vitals:   04/17/17 1312  BP: 126/68  Pulse: 68   General Examination: The patient is a very pleasant 84 y.o. female in no acute distress. She appears frail, well groomed.minimally verbal.  HEENT: Normocephalic, atraumatic, pupils are equal, round and reactive to light and accommodation. Status post bilateral cataract repairs. Extraocular tracking is difficult, limitation of downgaze, moderate decrease in eye blink rate, face is symmetric with moderate facial masking and significant lower lip and jaw tremor. Neck is severely rigid. She has no significant airway crowding, hearing is mildly impaired. There is no drooling. No facial dyskinesias are noted. tongue and palate are symmetrical.  Chest: Clear to auscultation without wheezing, rhonchi or crackles noted.  Heart: S1+S2+0, regular and normal without murmurs, rubs or gallops noted.   Abdomen: Soft, non-tender and non-distended with normal bowel sounds appreciated on auscultation.  Extremities: There is no pitting edema in the distal lower extremities bilaterally. Pedal pulses are intact.  Skin: Warm and dry without trophic changes noted.  Musculoskeletal: exam reveals no obvious joint deformities, tenderness or joint swelling or erythema.   Neurologically:  Mental status: The patient is awake, alert and oriented to self, circumstance. Her immediate and remote memory, attention, language skills and fund of knowledge are impaired. Affect is constricted.   In June 2016: Her MMSE score is 27/30. CDT is 0/4. AFT (Animal  Fluency Test) score is 6. Geriatric Depression Scale Score is 9.   On 04/11/2015: MMSE: 24/30, CDT: 1/4, AFT: 8/min.  On 10/09/2015: MMSE: 26/30, CDT: 1/4, AFT: 9/min.  On 04/15/2016: MMSE: 21/30, CDT: not tested, AFT: 6/min.   Cranial nerves II - XII are as described above under HEENT exam.  Motor exam: Thin bulk, global strength of 4-5, no dyskinesias, tone is mild to moderately rigid, some cogwheeling noted in both upper extremities, moderate to severe bradykinesia noted. There is no drift. Reflexes are about 1+ throughout. On fine motor testing she has moderate to severe difficulty throughout. She has a moderate resting tremor in both upper extremities, no tremor in the lower extremities. She has no significant lateralization. Posture and gait are not testable today. Sensory exam is intact to light touch in the upper and  lower extremities. She is not safe to stand unsupported, Romberg is not testable.   Assessment and Plan:   In summary, ADJOA ALTHOUSE is a very pleasant 84 year old female with an underlying complex medical history of asthma, depression, anxiety, diverticulosis, hypothyroidism, colitis, history of TIA, reflux disease, allergic rhinitis, who presents for follow-up consultation of her parkinsonism, 7+ years duration, some lateralization previously noted on the left, not so much any longer, had been on Requip and levodopa for years. She did not have any telltale responsible to medication in the past. 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 She had MRI testing. She had consultations at [place]. They even thought about consultation at [facility] but did not pursue this. She has been on Sinemet immediate release 1-1/2 pills 4 times a day and I suggested we continue. Unfortunately, there is not a whole lot we can add today. We  talked about the importance of being proactive with regard to constipation issues. They are reminded to utilize natural fiber, hydration, probiotic and stool softeners as well as laxatives as needed. I suggested a 6 month follow-up, she can see one of our nurse practitioners next time. I answered all their questions today and the patient and her husband were in agreement. Of note, with time, she has had complications including some swallowing difficulties, deconditioning, memory loss, sensitivities at side effects of medications. She has been on generic Namenda 10 mg twice daily. She's no longer on Artane, no longer on Neupro or Aricept, no longer on Requip since last year. He is also no longer on Sinemet CR. I spent 25 minutes in total face-to-face time with the patient, more than 50% of which was spent in counseling and coordination of care, reviewing test results, reviewing medication and discussing or reviewing the diagnosis of atypical parkinsonism, its prognosis and treatment options. Pertinent laboratory and imaging test results that were available during this visit with the patient were reviewed by me and considered in my medical decision making (see chart for details).

## 2017-08-04 ENCOUNTER — Other Ambulatory Visit: Payer: Self-pay | Admitting: Family Medicine

## 2017-08-04 ENCOUNTER — Ambulatory Visit
Admission: RE | Admit: 2017-08-04 | Discharge: 2017-08-04 | Disposition: A | Discharge status: Home / Self Care | Payer: Medicare (Managed Care) | Source: Ambulatory Visit | Attending: Family Medicine | Admitting: Family Medicine

## 2017-08-04 DIAGNOSIS — M25531 Pain in right wrist: Secondary | ICD-10-CM

## 2017-10-20 ENCOUNTER — Ambulatory Visit (INDEPENDENT_AMBULATORY_CARE_PROVIDER_SITE_OTHER): Payer: Medicare (Managed Care) | Admitting: Adult Health

## 2017-10-20 ENCOUNTER — Encounter: Payer: Self-pay | Admitting: Adult Health

## 2017-10-20 VITALS — BP 106/54 | HR 64

## 2017-10-20 DIAGNOSIS — G20 Parkinson's disease: Secondary | ICD-10-CM | POA: Diagnosis not present

## 2017-10-20 DIAGNOSIS — R1310 Dysphagia, unspecified: Secondary | ICD-10-CM

## 2017-10-20 NOTE — Patient Instructions (Signed)
Your Plan:  Continue Sinemet Swallowing eval ordered If your symptoms worsen or you develop new symptoms please let us know.   Thank you for [facility]. I hope we have been able to provide you high quality care today.  You may receive a patient satisfaction survey over the next few weeks. We would appreciate your feedback and comments so that we may continue to improve ourselves and the health of our patients.

## 2017-10-20 NOTE — Progress Notes (Addendum)
PATIENT: Deanna Page DOB: March 01, 1930  REASON FOR VISIT: follow up HISTORY FROM: patient  HISTORY OF PRESENT ILLNESS: Today 10/20/17:  Deanna Page is a 84 year old female with a history of atypical parkinsonism.  She returns today for follow-up.  She is with her husband.  He reports at home she requires assistance with ADLs.  She is able to feed herself but there are occasions that she does need help.  He has noticed that she is coughing more.  States that she has trouble with swallowing liquids and she no longer chews food very well.  He reports that she has had swallowing test in the past but no changes to her diet were recommended.  The patient primarily uses a wheelchair.  Her husband reports that she is able to ambulate with his assistance for small distances.  She feels that her tremor in the hands have remained the same.  She denies any trouble sleeping.  She does report drooling.  She returns today for an evaluation.  HISTORY 04/17/2017: She reports very little today, Denies any pain, feels like her appetite is okay, no recent falls, does not wish to go back to [facility]. She is off of Requip, takes Sinemet immediate release 1-1/2 pills 4 times a day, no longer there long-acting at night. Her husband reports that she drinks plenty of water but she does cough with swallowing and that he has to cut up her food and give her softer food. She has constipation, she can go 2 or 3 days without going to the bathroom. They have MiraLAX at the house but they have not been using it on a regular basis.   REVIEW OF SYSTEMS: Out of a complete 14 system review of symptoms, the patient complains only of the following symptoms, and all other reviewed systems are negative.  See HPI  ALLERGIES: Allergies  Allergen Reactions  . Erythromycin     Other reaction(s): Other (See Comments) Other Reaction: GI Upset Unknown reaction  . Penicillins Rash and Shortness Of Breath    Unknown  reaction Has patient had a PCN reaction causing immediate rash, facial/tongue/throat swelling, SOB or lightheadedness with hypotension: Unknown Has patient had a PCN reaction causing severe rash involving mucus membranes or skin necrosis: Unknown Has patient had a PCN reaction that required hospitalization: Unknown Has patient had a PCN reaction occurring within the last 10 years: Unknown If all of the above answers are "NO", then may proceed with Cephalosporin use.   Mart Piggs [Donepezil Hcl]     Unknown reaction  . Clindamycin/Lincomycin Itching  . Doxycycline     Unknown reaction  . Pneumococcal Vaccines     Unknown reaction    HOME MEDICATIONS: Outpatient Medications Prior to Visit  Medication Sig Dispense Refill  . acetaminophen (TYLENOL) 325 MG tablet Take 650 mg by mouth 2 (two) times daily as needed for moderate pain.    Marland Kitchen atenolol (TENORMIN) 25 MG tablet Take 25 mg by mouth 2 (two) times daily.     . carbidopa-levodopa (SINEMET IR) 25-100 MG tablet Take 1.5 tablets by mouth 4 (four) times daily. 180 tablet 5  . Carbidopa-Levodopa ER (SINEMET CR) 25-100 MG tablet controlled release Take 1 tablet by mouth daily.    . cholecalciferol (VITAMIN D) 400 units TABS tablet Take 800 Units by mouth daily.    . citalopram (CELEXA) 20 MG tablet Take 1 tablet (20 mg total) by mouth at bedtime. 30 tablet 5  . clonazePAM (KLONOPIN) 0.5 MG  tablet Take 0.5 mg by mouth 3 (three) times daily.     Marland Kitchen. estrogens, conjugated, (PREMARIN) 0.625 MG tablet Take 0.625 mg by mouth daily. Take daily for 21 days then do not take for 7 days.    . fluorometholone (FML) 0.1 % ophthalmic suspension Place 1 drop into the left eye daily.    Marland Kitchen. levothyroxine (SYNTHROID, LEVOTHROID) 50 MCG tablet Take 50 mcg by mouth daily before breakfast.    . memantine (NAMENDA) 10 MG tablet Take 10 mg by mouth 2 (two) times daily.     . Mouthwashes (BIOTENE DRY MOUTH MT) Use as directed in the mouth or throat 2 (two) times daily.     . Nutritional Supplements (ENSURE ORIGINAL) LIQD Take 8 oz by mouth every other day.    . pantoprazole (PROTONIX) 40 MG tablet Take 40 mg by mouth daily.    Marland Kitchen. perphenazine (TRILAFON) 4 MG tablet Take 4 mg by mouth 2 (two) times daily.    . polyvinyl alcohol (LIQUIFILM TEARS) 1.4 % ophthalmic solution Place 1 drop into both eyes 4 (four) times daily as needed for dry eyes.     . triazolam (HALCION) 0.25 MG tablet Take 0.125 mg by mouth at bedtime.     Marland Kitchen. trimethoprim (TRIMPEX) 100 MG tablet Take 100 mg by mouth daily.    . Vitamin D, Cholecalciferol, 1000 units CAPS Take 1 capsule by mouth daily.     No facility-administered medications prior to visit.     PAST MEDICAL HISTORY: Past Medical History:  Diagnosis Date  . Allergic rhinitis   . Anxiety   . Asthma   . Bowel obstruction (HCC)   . CKD (chronic kidney disease), stage III (HCC)   . Dementia   . Diverticulosis   . Esophageal dysmotility   . Esophageal stricture 2005  . GERD (gastroesophageal reflux disease)   . Hepatitis A   . Hiatal hernia   . Hypertension   . Hypothyroidism   . Internal hemorrhoids   . Irritable bowel syndrome   . Ischemic colitis, enteritis, or enterocolitis (HCC) 2010  . Major depression    with paranoia and insomnia  . OSA (obstructive sleep apnea)   . Parkinson disease (HCC)   . Pneumonia   . TIA (transient ischemic attack)   . Urinary incontinence   . Vitamin D deficiency     PAST SURGICAL HISTORY: Past Surgical History:  Procedure Laterality Date  . ABDOMINAL HYSTERECTOMY  1969  . APPENDECTOMY  1969  . BLADDER SURGERY    . BREAST LUMPECTOMY  1995  . CATARACT EXTRACTION    . CHOLECYSTECTOMY  2000  . CORNEAL TRANSPLANT Left   . INTRAOCULAR PROSTHESES INSERTION     [facility]  . TUBAL LIGATION  1968    FAMILY HISTORY: Family History  Problem Relation Age of Onset  . Stroke Mother   . Parkinson's disease Mother   . Lung cancer Father   . ALS Sister   . Liver disease  Sister   . Parkinson's disease Maternal Grandmother   . Parkinson's disease Maternal Aunt   . Emphysema Sister   . Emphysema Brother   . Colon cancer Neg Hx   . Colon polyps Neg Hx   . Diabetes Neg Hx   . Gallbladder disease Neg Hx   . Esophageal cancer Neg Hx     SOCIAL HISTORY: Social History   Socioeconomic History  . Marital status: Married    Spouse name: Molly MaduroRobert  . Number  of children: 3  . Years of education: 9112  . Highest education level: Not on file  Occupational History  . Occupation: Retired    Associate Professormployer: RETIRED  Social Needs  . Financial resource strain: Not on file  . Food insecurity:    Worry: Not on file    Inability: Not on file  . Transportation needs:    Medical: Not on file    Non-medical: Not on file  Tobacco Use  . Smoking status: Never Smoker  . Smokeless tobacco: Never Used  Substance and Sexual Activity  . Alcohol use: No    Alcohol/week: 0.0 standard drinks  . Drug use: No  . Sexual activity: Not on file  Lifestyle  . Physical activity:    Days per week: Not on file    Minutes per session: Not on file  . Stress: Not on file  Relationships  . Social connections:    Talks on phone: Not on file    Gets together: Not on file    Attends religious service: Not on file    Active member of club or organization: Not on file    Attends meetings of clubs or organizations: Not on file    Relationship status: Not on file  . Intimate partner violence:    Fear of current or ex partner: Not on file    Emotionally abused: Not on file    Physically abused: Not on file    Forced sexual activity: Not on file  Other Topics Concern  . Not on file  Social History Narrative   Patient is married Molly Maduro(Robert) and lives with her husband.   Patient has three children.   Patient is retired.   Patient has an high school education.   Patient does drink caffeine beverages.   Patient is left handed.            PHYSICAL EXAM  Vitals:   10/20/17 1259  BP:  (!) 106/54  Pulse: 64   There is no height or weight on file to calculate BMI.  Generalized: Well developed, in no acute distress   Neurological examination  Mentation: Alert oriented to time, place, history taking. Follows all commands speech and language fluent Cranial nerve II-XII: Pupils were equal round reactive to light. Extraocular movements were full, visual field were full on confrontational test. Facial sensation and strength were normal. Uvula tongue midline. Head turning and shoulder shrug  were normal and symmetric. Motor: The motor testing reveals 5 over 5 strength of all 4 extremities.   Resting tremor noted in the upper extremities. Sensory: Sensory testing is intact to soft touch on all 4 extremities. No evidence of extinction is noted.  Coordination: Cerebellar testing reveals good finger-nose-finger and heel-to-shin bilaterally.  Gait and station: Patient is in a wheelchair.  Gait not attempted today.   DIAGNOSTIC DATA (LABS, IMAGING, TESTING) - I reviewed patient records, labs, notes, testing and imaging myself where available.  Lab Results  Component Value Date   WBC 10.0 02/21/2017   HGB 11.7 (L) 02/21/2017   HCT 34.7 (L) 02/21/2017   MCV 94.8 02/21/2017   PLT 211 02/21/2017      Component Value Date/Time   NA 136 02/21/2017 1833   K 3.8 02/21/2017 1833   CL 104 02/21/2017 1833   CO2 23 02/21/2017 1833   GLUCOSE 103 (H) 02/21/2017 1833   GLUCOSE 69 (L) 12/24/2005 1037   BUN 24 (H) 02/21/2017 1833   CREATININE 1.18 (H) 02/21/2017 1833  CALCIUM 9.7 02/21/2017 1833   PROT 7.5 02/21/2017 1833   ALBUMIN 4.0 02/21/2017 1833   AST 28 02/21/2017 1833   ALT 6 (L) 02/21/2017 1833   ALKPHOS 87 02/21/2017 1833   BILITOT 0.8 02/21/2017 1833   GFRNONAA 41 (L) 02/21/2017 1833   GFRAA 47 (L) 02/21/2017 1833   Lab Results  Component Value Date   CHOL 183 12/24/2005   HDL 75.2 12/24/2005   LDLCALC 97 12/24/2005   TRIG 54 12/24/2005   CHOLHDL 2.4 CALC  12/24/2005   Lab Results  Component Value Date   HGBA1C 5.7 12/24/2005   Lab Results  Component Value Date   VITAMINB12 257 12/31/2016   Lab Results  Component Value Date   TSH 1.059 12/31/2016      ASSESSMENT AND PLAN 84 y.o. year old female  has a past medical history of Allergic rhinitis, Anxiety, Asthma, Bowel obstruction (HCC), CKD (chronic kidney disease), stage III (HCC), Dementia, Diverticulosis, Esophageal dysmotility, Esophageal stricture (2005), GERD (gastroesophageal reflux disease), Hepatitis A, Hiatal hernia, Hypertension, Hypothyroidism, Internal hemorrhoids, Irritable bowel syndrome, Ischemic colitis, enteritis, or enterocolitis (HCC) (2010), Major depression, OSA (obstructive sleep apnea), Parkinson disease (HCC), Pneumonia, TIA (transient ischemic attack), Urinary incontinence, and Vitamin D deficiency. here with:  1.  Atypical Parkinsonism 2.  Swallowing difficulty  The patient will continue on Sinemet 1-1/2 tablet 4 times a day.  I will refer the patient to speech therapy for swallow evaluation and modified barium swallow.  I have advised the patient and her husband that if her symptoms worsen or she develops new symptoms they should let us know.  He will follow-up in 6 months or sooner if needed.     Butch Penny, MSN, NP-C 10/20/2017, 12:03 PM [facility] [address], [address] [place], [place] [address] [phone]  I reviewed the above note and documentation by the Nurse Practitioner and agree with the history, physical exam, assessment and plan as outlined above. I was immediately available for face-to-face consultation. Huston Foley, MD, PhD [facility])

## 2017-11-14 ENCOUNTER — Other Ambulatory Visit (HOSPITAL_COMMUNITY): Payer: Self-pay | Admitting: Family Medicine

## 2017-11-14 DIAGNOSIS — R1310 Dysphagia, unspecified: Secondary | ICD-10-CM

## 2017-11-20 ENCOUNTER — Ambulatory Visit (HOSPITAL_COMMUNITY)
Admission: RE | Admit: 2017-11-20 | Discharge: 2017-11-20 | Disposition: A | Discharge status: Home / Self Care | Payer: Medicare (Managed Care) | Source: Ambulatory Visit | Attending: Family Medicine | Admitting: Family Medicine

## 2017-11-20 DIAGNOSIS — G20 Parkinson's disease: Secondary | ICD-10-CM | POA: Diagnosis not present

## 2017-11-20 DIAGNOSIS — R1310 Dysphagia, unspecified: Secondary | ICD-10-CM | POA: Diagnosis not present

## 2018-02-11 ENCOUNTER — Inpatient Hospital Stay (HOSPITAL_COMMUNITY)
Admission: EM | Admit: 2018-02-11 | Discharge: 2018-02-12 | DRG: 392 | Disposition: A | Discharge status: Skilled Nursing Facility | Payer: Medicare (Managed Care) | Attending: Internal Medicine | Admitting: Internal Medicine

## 2018-02-11 ENCOUNTER — Emergency Department (HOSPITAL_COMMUNITY): Payer: Medicare (Managed Care)

## 2018-02-11 ENCOUNTER — Encounter (HOSPITAL_COMMUNITY): Payer: Self-pay | Admitting: Emergency Medicine

## 2018-02-11 ENCOUNTER — Other Ambulatory Visit: Payer: Self-pay

## 2018-02-11 DIAGNOSIS — R4702 Dysphasia: Secondary | ICD-10-CM | POA: Diagnosis present

## 2018-02-11 DIAGNOSIS — I129 Hypertensive chronic kidney disease with stage 1 through stage 4 chronic kidney disease, or unspecified chronic kidney disease: Secondary | ICD-10-CM | POA: Diagnosis present

## 2018-02-11 DIAGNOSIS — E039 Hypothyroidism, unspecified: Secondary | ICD-10-CM | POA: Diagnosis present

## 2018-02-11 DIAGNOSIS — K589 Irritable bowel syndrome without diarrhea: Secondary | ICD-10-CM | POA: Diagnosis not present

## 2018-02-11 DIAGNOSIS — K219 Gastro-esophageal reflux disease without esophagitis: Principal | ICD-10-CM | POA: Diagnosis present

## 2018-02-11 DIAGNOSIS — F0280 Dementia in other diseases classified elsewhere without behavioral disturbance: Secondary | ICD-10-CM | POA: Diagnosis present

## 2018-02-11 DIAGNOSIS — R000 Tachycardia, unspecified: Secondary | ICD-10-CM | POA: Diagnosis present

## 2018-02-11 DIAGNOSIS — N183 Chronic kidney disease, stage 3 (moderate): Secondary | ICD-10-CM | POA: Diagnosis present

## 2018-02-11 DIAGNOSIS — Z947 Corneal transplant status: Secondary | ICD-10-CM | POA: Diagnosis not present

## 2018-02-11 DIAGNOSIS — Z90710 Acquired absence of both cervix and uterus: Secondary | ICD-10-CM

## 2018-02-11 DIAGNOSIS — Z79899 Other long term (current) drug therapy: Secondary | ICD-10-CM

## 2018-02-11 DIAGNOSIS — Z888 Allergy status to other drugs, medicaments and biological substances status: Secondary | ICD-10-CM

## 2018-02-11 DIAGNOSIS — Z9049 Acquired absence of other specified parts of digestive tract: Secondary | ICD-10-CM | POA: Diagnosis not present

## 2018-02-11 DIAGNOSIS — G20 Parkinson's disease: Secondary | ICD-10-CM | POA: Diagnosis present

## 2018-02-11 DIAGNOSIS — Z881 Allergy status to other antibiotic agents status: Secondary | ICD-10-CM

## 2018-02-11 DIAGNOSIS — Z8673 Personal history of transient ischemic attack (TIA), and cerebral infarction without residual deficits: Secondary | ICD-10-CM | POA: Diagnosis not present

## 2018-02-11 DIAGNOSIS — Z823 Family history of stroke: Secondary | ICD-10-CM

## 2018-02-11 DIAGNOSIS — G309 Alzheimer's disease, unspecified: Secondary | ICD-10-CM | POA: Diagnosis present

## 2018-02-11 DIAGNOSIS — Z79890 Hormone replacement therapy (postmenopausal): Secondary | ICD-10-CM | POA: Diagnosis not present

## 2018-02-11 DIAGNOSIS — F329 Major depressive disorder, single episode, unspecified: Secondary | ICD-10-CM | POA: Diagnosis present

## 2018-02-11 DIAGNOSIS — Z820 Family history of epilepsy and other diseases of the nervous system: Secondary | ICD-10-CM

## 2018-02-11 DIAGNOSIS — G4733 Obstructive sleep apnea (adult) (pediatric): Secondary | ICD-10-CM | POA: Diagnosis present

## 2018-02-11 DIAGNOSIS — E559 Vitamin D deficiency, unspecified: Secondary | ICD-10-CM | POA: Diagnosis present

## 2018-02-11 DIAGNOSIS — Z887 Allergy status to serum and vaccine status: Secondary | ICD-10-CM | POA: Diagnosis not present

## 2018-02-11 DIAGNOSIS — R509 Fever, unspecified: Secondary | ICD-10-CM

## 2018-02-11 DIAGNOSIS — J45909 Unspecified asthma, uncomplicated: Secondary | ICD-10-CM | POA: Diagnosis present

## 2018-02-11 DIAGNOSIS — R0789 Other chest pain: Secondary | ICD-10-CM | POA: Diagnosis present

## 2018-02-11 DIAGNOSIS — B370 Candidal stomatitis: Secondary | ICD-10-CM | POA: Diagnosis not present

## 2018-02-11 DIAGNOSIS — R1310 Dysphagia, unspecified: Secondary | ICD-10-CM | POA: Diagnosis not present

## 2018-02-11 DIAGNOSIS — R079 Chest pain, unspecified: Secondary | ICD-10-CM

## 2018-02-11 LAB — CBC
HCT: 33.5 % — ABNORMAL LOW (ref 36.0–46.0)
Hemoglobin: 10.7 g/dL — ABNORMAL LOW (ref 12.0–15.0)
MCH: 30.7 pg (ref 26.0–34.0)
MCHC: 31.9 g/dL (ref 30.0–36.0)
MCV: 96.3 fL (ref 80.0–100.0)
Platelets: 189 10*3/uL (ref 150–400)
RBC: 3.48 MIL/uL — ABNORMAL LOW (ref 3.87–5.11)
RDW: 13.4 % (ref 11.5–15.5)
WBC: 7.4 10*3/uL (ref 4.0–10.5)
nRBC: 0 % (ref 0.0–0.2)

## 2018-02-11 LAB — TROPONIN I
Troponin I: <0.03 ng/mL (ref <0.03)
Troponin I: <0.03 ng/mL (ref <0.03)

## 2018-02-11 LAB — I-STAT TROPONIN, ED: TROPONIN I, POC: 0.01 ng/mL (ref 0.00–0.08)

## 2018-02-11 LAB — URINALYSIS, ROUTINE W REFLEX MICROSCOPIC
BILIRUBIN URINE: NEGATIVE
Bacteria, UA: NONE SEEN
Glucose, UA: NEGATIVE mg/dL
Ketones, ur: 5 mg/dL — AB
Nitrite: NEGATIVE
PH: 7 (ref 5.0–8.0)
Protein, ur: NEGATIVE mg/dL
RBC / HPF: >50 RBC/hpf — ABNORMAL HIGH (ref 0–5)
Specific Gravity, Urine: 1.018 (ref 1.005–1.030)

## 2018-02-11 LAB — COMPREHENSIVE METABOLIC PANEL
ALT: 6 U/L (ref 0–44)
AST: 22 U/L (ref 15–41)
Albumin: 3.4 g/dL — ABNORMAL LOW (ref 3.5–5.0)
Alkaline Phosphatase: 68 U/L (ref 38–126)
Anion gap: 11 (ref 5–15)
BUN: 22 mg/dL (ref 8–23)
CALCIUM: 9.1 mg/dL (ref 8.9–10.3)
CO2: 20 mmol/L — ABNORMAL LOW (ref 22–32)
Chloride: 106 mmol/L (ref 98–111)
Creatinine, Ser: 1.28 mg/dL — ABNORMAL HIGH (ref 0.44–1.00)
GFR calc Af Amer: 44 mL/min — ABNORMAL LOW (ref >60)
GFR, EST NON AFRICAN AMERICAN: 38 mL/min — AB (ref >60)
Glucose, Bld: 110 mg/dL — ABNORMAL HIGH (ref 70–99)
Potassium: 3.8 mmol/L (ref 3.5–5.1)
Sodium: 137 mmol/L (ref 135–145)
Total Bilirubin: 0.6 mg/dL (ref 0.3–1.2)
Total Protein: 6.2 g/dL — ABNORMAL LOW (ref 6.5–8.1)

## 2018-02-11 LAB — LIPASE, BLOOD: Lipase: 49 U/L (ref 11–51)

## 2018-02-11 LAB — I-STAT CG4 LACTIC ACID, ED
Lactic Acid, Venous: 1.79 mmol/L (ref 0.5–1.9)
Lactic Acid, Venous: 1.44 mmol/L (ref 0.5–1.9)

## 2018-02-11 LAB — INFLUENZA PANEL BY PCR (TYPE A & B)
INFLAPCR: NEGATIVE
Influenza B By PCR: NEGATIVE

## 2018-02-11 MED ORDER — CARBIDOPA-LEVODOPA 25-100 MG PO TABS
1.5000 | ORAL_TABLET | Freq: Four times a day (QID) | ORAL | Status: DC
  Administered 2018-02-11 – 2018-02-12 (×2): 1.5 via ORAL
  Filled 2018-02-11: qty 1.5
  Filled 2018-02-11 (×2): qty 2

## 2018-02-11 MED ORDER — PANTOPRAZOLE SODIUM 20 MG PO TBEC
20.0000 mg | DELAYED_RELEASE_TABLET | Freq: Every day | ORAL | Status: DC
  Administered 2018-02-12: 20 mg via ORAL
  Filled 2018-02-11: qty 1

## 2018-02-11 MED ORDER — KETOROLAC TROMETHAMINE 15 MG/ML IJ SOLN: 15.0000 mg | Freq: Once | INTRAMUSCULAR | Status: DC

## 2018-02-11 MED ORDER — CLONAZEPAM 0.5 MG PO TABS
0.2500 mg | ORAL_TABLET | Freq: Three times a day (TID) | ORAL | Status: DC
  Filled 2018-02-11: qty 1

## 2018-02-11 MED ORDER — ENOXAPARIN SODIUM 30 MG/0.3ML ~~LOC~~ SOLN
30.0000 mg | SUBCUTANEOUS | Status: DC
  Administered 2018-02-12: 30 mg via SUBCUTANEOUS
  Filled 2018-02-11: qty 0.3

## 2018-02-11 MED ORDER — PROMETHAZINE HCL 25 MG PO TABS: 12.5000 mg | ORAL_TABLET | Freq: Four times a day (QID) | ORAL | Status: DC | PRN

## 2018-02-11 MED ORDER — SENNOSIDES-DOCUSATE SODIUM 8.6-50 MG PO TABS: 1.0000 | ORAL_TABLET | Freq: Every evening | ORAL | Status: DC | PRN

## 2018-02-11 MED ORDER — ACETAMINOPHEN 650 MG RE SUPP: 650.0000 mg | Freq: Four times a day (QID) | RECTAL | Status: DC | PRN

## 2018-02-11 MED ORDER — CLONAZEPAM 0.25 MG PO TBDP
0.2500 mg | ORAL_TABLET | Freq: Three times a day (TID) | ORAL | Status: DC
  Administered 2018-02-11 – 2018-02-12 (×2): 0.25 mg via ORAL
  Filled 2018-02-11: qty 1

## 2018-02-11 MED ORDER — TRIAZOLAM 0.125 MG PO TABS
0.1250 mg | ORAL_TABLET | Freq: Every day | ORAL | Status: DC
  Administered 2018-02-11: 0.125 mg via ORAL
  Filled 2018-02-11: qty 1

## 2018-02-11 MED ORDER — NITROGLYCERIN 0.4 MG SL SUBL
SUBLINGUAL_TABLET | SUBLINGUAL | Status: AC
  Administered 2018-02-11: 0.4 mg
  Filled 2018-02-11: qty 1

## 2018-02-11 MED ORDER — SODIUM CHLORIDE 0.9 % IV SOLN
INTRAVENOUS | Status: DC
  Administered 2018-02-11: 22:00:00 via INTRAVENOUS

## 2018-02-11 MED ORDER — LIDOCAINE VISCOUS HCL 2 % MT SOLN
15.0000 mL | Freq: Once | OROMUCOSAL | Status: AC
  Administered 2018-02-11: 15 mL via ORAL
  Filled 2018-02-11: qty 15

## 2018-02-11 MED ORDER — ACETAMINOPHEN 325 MG PO TABS: 650.0000 mg | ORAL_TABLET | Freq: Four times a day (QID) | ORAL | Status: DC | PRN

## 2018-02-11 MED ORDER — ACETAMINOPHEN 650 MG RE SUPP
650.0000 mg | Freq: Once | RECTAL | Status: AC
  Administered 2018-02-11: 650 mg via RECTAL
  Filled 2018-02-11: qty 1

## 2018-02-11 MED ORDER — MEMANTINE HCL 5 MG PO TABS
10.0000 mg | ORAL_TABLET | Freq: Two times a day (BID) | ORAL | Status: DC
  Administered 2018-02-11 – 2018-02-12 (×2): 10 mg via ORAL
  Filled 2018-02-11: qty 1
  Filled 2018-02-11 (×2): qty 2

## 2018-02-11 MED ORDER — ATENOLOL 25 MG PO TABS: 25.0000 mg | ORAL_TABLET | Freq: Two times a day (BID) | ORAL | Status: DC

## 2018-02-11 MED ORDER — NITROGLYCERIN 0.4 MG SL SUBL: 0.4000 mg | SUBLINGUAL_TABLET | SUBLINGUAL | Status: DC | PRN

## 2018-02-11 MED ORDER — ALUM & MAG HYDROXIDE-SIMETH 200-200-20 MG/5ML PO SUSP
30.0000 mL | Freq: Once | ORAL | Status: AC
  Administered 2018-02-11: 30 mL via ORAL
  Filled 2018-02-11: qty 30

## 2018-02-11 MED ORDER — LEVOTHYROXINE SODIUM 50 MCG PO TABS
50.0000 ug | ORAL_TABLET | Freq: Every day | ORAL | Status: DC
  Administered 2018-02-12: 50 ug via ORAL
  Filled 2018-02-11 (×2): qty 1

## 2018-02-11 MED ORDER — SODIUM CHLORIDE 0.9 % IV SOLN: INTRAVENOUS | Status: AC

## 2018-02-11 MED ORDER — CITALOPRAM HYDROBROMIDE 20 MG PO TABS: 20.0000 mg | ORAL_TABLET | Freq: Every day | ORAL | Status: DC

## 2018-02-11 NOTE — ED Notes (Signed)
Got patient undress on the monitor did ekg shown to Dr Patria Maneampos patient is resting with family and call bell in reach

## 2018-02-11 NOTE — ED Triage Notes (Signed)
Pt here from Nursing home with  C/o chest pain that radiates to her left side of her neck , pt received 324 mg asa and 2 nitro

## 2018-02-11 NOTE — ED Provider Notes (Addendum)
[facility] EMERGENCY DEPARTMENT Provider Note   CSN: [id number] Arrival date & time: 02/11/18  1420     History   Chief Complaint Chief Complaint  Patient presents with  . Chest Pain    HPI Deanna Page is a 84 y.o. female with h/o TIA, alzheimer's dementia, dysphagia, SBO, HTN here for evaluation of chest pain.  Onset unclear per EMS.  Patient initially told EMS it was a tightness and it radiated to her left neck.  The pain has been constant.  The pain began after eating.  She points to her lower sternum.  She denies any changes in her breathing.  Level 5 caveat due to dementia.  She was given aspirin and nitroglycerin on route without any relief.  Patient also points to her suprapubic abdomen.  Her husband is at bedside who states he went to visit pt and she complained of chest hurting so he called RN who evaluated pt.  She was then sent to ER.  Per husband, her speech and tremors are at her baseline.  Per husband, patient cannot feed herself anymore.  She is on a soft diet.  She frequently chokes and coughs up some food due to her dysphasia.  No alleviating factors.  HPI  Past Medical History:  Diagnosis Date  . Allergic rhinitis   . Anxiety   . Asthma   . Bowel obstruction (HCC)   . CKD (chronic kidney disease), stage III (HCC)   . Dementia (HCC)   . Diverticulosis   . Esophageal dysmotility   . Esophageal stricture 2005  . GERD (gastroesophageal reflux disease)   . Hepatitis A   . Hiatal hernia   . Hypertension   . Hypothyroidism   . Internal hemorrhoids   . Irritable bowel syndrome   . Ischemic colitis, enteritis, or enterocolitis (HCC) 2010  . Major depression    with paranoia and insomnia  . OSA (obstructive sleep apnea)   . Parkinson disease (HCC)   . Pneumonia   . TIA (transient ischemic attack)   . Urinary incontinence   . Vitamin D deficiency     Patient Active Problem List   Diagnosis Date Noted  . Suicidal ideation   .  Altered mental status 12/30/2016  . UTI (urinary tract infection) 12/30/2016  . Dementia (HCC) 12/30/2016  . Parkinson's disease (HCC) 03/01/2013  . GASTRITIS 02/22/2010  . ABDOMINAL PAIN-EPIGASTRIC 02/22/2010  . DIVERTICULOSIS-COLON 12/19/2008  . OTHER DYSPHAGIA 10/07/2007  . TIA 01/21/2007  . DIARRHEA 01/21/2007  . GAIT DISTURBANCE 01/01/2007  . GASTROESOPHAGEAL REFLUX DISEASE 12/20/2006  . HYSTERECTOMY, HX OF 12/20/2006  . APPENDECTOMY, HX OF 12/20/2006  . SYNCOPE 10/01/2006  . SYMPTOM, MEMORY LOSS 09/16/2006  . BRONCHITIS, ACUTE 06/30/2006  . DEPRESSION 06/02/2006  . HYPERTENSION 06/02/2006  . ALLERGIC RHINITIS 06/02/2006  . ASTHMA 06/02/2006  . PROLAPSE, VAGINAL WALL, RECTOCELE 06/02/2006  . DIZZINESS OR VERTIGO 06/02/2006  . URINARY INCONTINENCE 06/02/2006  . PNEUMONIA, HX OF 06/02/2006  . BLADDER REPAIR, HX OF 06/02/2006  . INSOMNIA, HX OF 06/02/2006    Past Surgical History:  Procedure Laterality Date  . ABDOMINAL HYSTERECTOMY  1969  . APPENDECTOMY  1969  . BLADDER SURGERY    . BREAST LUMPECTOMY  1995  . CATARACT EXTRACTION    . CHOLECYSTECTOMY  2000  . CORNEAL TRANSPLANT Left   . INTRAOCULAR PROSTHESES INSERTION     Left eye at [facility]  . TUBAL LIGATION  1968     OB History  None      Home Medications    Prior to Admission medications   Medication Sig Start Date End Date Taking? Authorizing Provider  acetaminophen (TYLENOL) 325 MG tablet Take 650 mg by mouth 2 (two) times daily as needed for moderate pain.   Yes [provider]  atenolol (TENORMIN) 25 MG tablet Take 25 mg by mouth 2 (two) times daily.    Yes [provider]  carbidopa-levodopa (SINEMET IR) 25-100 MG tablet Take 1.5 tablets by mouth 4 (four) times daily. 04/15/16  Yes Huston FoleyAthar, Saima, MD  Carbidopa-Levodopa ER (SINEMET CR) 25-100 MG tablet controlled release Take 1 tablet by mouth daily.   Yes [provider]  citalopram (CELEXA) 20 MG tablet Take 1 tablet (20 mg  total) by mouth at bedtime. 10/09/15  Yes Huston FoleyAthar, Saima, MD  clonazePAM (KLONOPIN) 0.5 MG tablet Take 0.25 mg by mouth 3 (three) times daily.    Yes [provider]  fluorometholone (FML) 0.1 % ophthalmic suspension Place 1 drop into the left eye daily.   Yes [provider]  levothyroxine (SYNTHROID, LEVOTHROID) 50 MCG tablet Take 50 mcg by mouth daily before breakfast.   Yes [provider]  memantine (NAMENDA) 10 MG tablet Take 10 mg by mouth 2 (two) times daily.    Yes [provider]  Mouthwashes (BIOTENE DRY MOUTH MT) Use as directed in the mouth or throat 2 (two) times daily.   Yes [provider]  Nutritional Supplements (ENSURE ORIGINAL) LIQD Take 8 oz by mouth 2 (two) times daily.    Yes [provider]  pantoprazole (PROTONIX) 20 MG tablet Take 20 mg by mouth daily.    Yes [provider]  perphenazine (TRILAFON) 4 MG tablet Take 4 mg by mouth 2 (two) times daily.   Yes [provider]  polyvinyl alcohol (LIQUIFILM TEARS) 1.4 % ophthalmic solution Place 1 drop into both eyes 4 (four) times daily as needed for dry eyes.    Yes [provider]  sennosides-docusate sodium (SENOKOT-S) 8.6-50 MG tablet Take 2 tablets by mouth at bedtime.   Yes [provider]  sodium chloride (MURO 128) 5 % ophthalmic ointment Place 1 application into the right eye at bedtime. For hx of Corneal transplant   Yes [provider]  sodium fluoride (PREVIDENT 5000 PLUS) 1.1 % CREA dental cream Place 1 application onto teeth every evening.   Yes [provider]  triazolam (HALCION) 0.25 MG tablet Take 0.125 mg by mouth at bedtime.    Yes [provider]  trimethoprim (TRIMPEX) 100 MG tablet Take 100 mg by mouth daily.   Yes [provider]  Vitamin D, Cholecalciferol, 1000 units CAPS Take 1 capsule by mouth daily.   Yes [provider]    Family History Family History  Problem  Relation Age of Onset  . Stroke Mother   . Parkinson's disease Mother   . Lung cancer Father   . ALS Sister   . Liver disease Sister   . Parkinson's disease Maternal Grandmother   . Parkinson's disease Maternal Aunt   . Emphysema Sister   . Emphysema Brother   . Colon cancer Neg Hx   . Colon polyps Neg Hx   . Diabetes Neg Hx   . Gallbladder disease Neg Hx   . Esophageal cancer Neg Hx     Social History Social History   Tobacco Use  . Smoking status: Never Smoker  . Smokeless tobacco: Never Used  Substance Use  Topics  . Alcohol use: No    Alcohol/week: 0.0 standard drinks  . Drug use: No     Allergies   Erythromycin; Penicillins; Aricept [donepezil hcl]; Clindamycin/lincomycin; Doxycycline; and Pneumococcal vaccines   Review of Systems Review of Systems  Unable to perform ROS: Dementia  All other systems reviewed and are negative.    Physical Exam Updated Vital Signs BP 127/67   Pulse 65   Temp (!) 101.1 F (38.4 C) (Rectal)   Resp (!) 23   SpO2 98%   Physical Exam  Constitutional: She appears well-developed and well-nourished.  Non toxic  HENT:  Head: Normocephalic and atraumatic.  Nose: Nose normal.  Eyes: Pupils are equal, round, and reactive to light. Conjunctivae and EOM are normal.  Neck: Normal range of motion.  Cardiovascular: Normal rate and regular rhythm.  Pulses:      Radial pulses are 1+ on the right side, and 1+ on the left side.       Dorsalis pedis pulses are 1+ on the right side, and 1+ on the left side.  No lower extremity edema.  Pulmonary/Chest: Effort normal and breath sounds normal.  Abdominal: Soft. Bowel sounds are normal. There is no tenderness.  No G/R/R. No suprapubic or CVA tenderness. Negative Murphy's and McBurney's. Active BS to lower quadrants.   Musculoskeletal: Normal range of motion.  Neurological: She is alert. She is disoriented.  Oriented to self, date of birth. Fasciculations of mouth.  Generalized, mild tremors  to upper extremities. Strength symmetric with hand grip and ankle flexion. Sensation grossly intact in hands and feet. PERRL intact bilaterally.     Skin: Skin is warm and dry. Capillary refill takes less than 2 seconds.  No rash, break down, ulcerations to sacrum or back.   Psychiatric: She has a normal mood and affect. Her behavior is normal.  Nursing note and vitals reviewed.    ED Treatments / Results  Labs (all labs ordered are listed, but only abnormal results are displayed) Labs Reviewed  CBC - Abnormal; Notable for the following components:      Result Value   RBC 3.48 (*)    Hemoglobin 10.7 (*)    HCT 33.5 (*)    All other components within normal limits  COMPREHENSIVE METABOLIC PANEL - Abnormal; Notable for the following components:   CO2 20 (*)    Glucose, Bld 110 (*)    Creatinine, Ser 1.28 (*)    Total Protein 6.2 (*)    Albumin 3.4 (*)    GFR calc non Af Amer 38 (*)    GFR calc Af Amer 44 (*)    All other components within normal limits  URINALYSIS, ROUTINE W REFLEX MICROSCOPIC - Abnormal; Notable for the following components:   Hgb urine dipstick SMALL (*)    Ketones, ur 5 (*)    Leukocytes, UA SMALL (*)    RBC / HPF >50 (*)    All other components within normal limits  URINE CULTURE  LIPASE, BLOOD  INFLUENZA PANEL BY PCR (TYPE A & B)  I-STAT TROPONIN, ED  I-STAT CG4 LACTIC ACID, ED  I-STAT CG4 LACTIC ACID, ED    EKG EKG Interpretation  Date/Time:  Wednesday February 11 2018 14:26:25 EST Ventricular Rate:  212 PR Interval:    QRS Duration: 162 QT Interval:    QTC Calculation:   R Axis:   -89 Text Interpretation:  Extreme tachycardia with wide complex, no further rhythm analysis attempted Artifact in lead(s) I V6 Confirmed  by Azalia Bilis ([id number]) on 02/11/2018 2:43:57 PM   Radiology Dg Chest 2 View  Result Date: 02/11/2018 CLINICAL DATA:  Chest pain EXAM: CHEST - 2 VIEW COMPARISON:  12/30/2016 FINDINGS: The heart size and mediastinal contours  are within normal limits. Both lungs are clear. The visualized skeletal structures are unremarkable. IMPRESSION: No active cardiopulmonary disease. Electronically Signed   By: Elige Ko   On: 02/11/2018 15:35    Procedures Procedures (including critical care time)  Medications Ordered in ED Medications  acetaminophen (TYLENOL) suppository 650 mg (650 mg Rectal Given 02/11/18 1551)     Initial Impression / Assessment and Plan / ED Course  I have reviewed the triage vital signs and the nursing notes.  Pertinent labs & imaging results that were available during my care of the patient were reviewed by me and considered in my medical decision making (see chart for details).  Clinical Course as of Feb 12 1647  Wed Feb 11, 2018  1536 Hemoglobin(!): 10.7 [CG]  1537 Leukocytes, UA(!): SMALL [CG]  1537 RBC / HPF(!): >50 [CG]  1537 WBC, UA: 21-50 [CG]  1537 Bacteria, UA: NONE SEEN [CG]  1537 Creatinine(!): 1.28 [CG]    Clinical Course User Index [CG] Liberty Handy, PA-C    84 year old with dysphasia, Alzheimer's dementia is here for chest pain.  She also points to her suprapubic abdomen.  Chest pain began after eating.  No known cardiac work-up on chart.  Her cardiac risk factors include age, hypertension.  No response to aspirin and nitroglycerin on route.  Given description, cardiac risk factors her cardiac risk factors HEART score = 4. Differential includes cardiac ischemia versus dysphasia.  She is febrile in the ER so pneumonia also on differential.  She is mostly wheelchair bound making PE a possibility although less likely, given fever, aspiration risk.   1553: Febrile but normal lactic acid, normal WBC. UA with small leuks, >50 RBC, 21-50 WBC but no bacteria, urine culture sent, will defer abx.  CXR w/o infiltrate.  Hgb 10.7, stable. Creatinine 1.28, normal BUN. Repeat EKGs hard to interpret, she is very shaky and EKG reading HR 180-200 and atrial fib with RVR, however on  palpation HR < 100 and regular.  Source of fever is unclear, she is high risk aspiration from dysphagia.  UA not convincing of infection. Infiltrate on CXR may fluff out with some fluids.  Given heart score will speak to hospitalist for observation for CP r/o, fever.  Final Clinical Impressions(s) / ED Diagnoses   1648: Patient able to swallow water without regurgitation.  No changes to her pain in her chest.  CSpoke to internal medicine Dr. Delma Officer who has accepted pt.   Final diagnoses:  Fever in adult  Chest pain in adult    ED Discharge Orders    None       Jerrell Mylar 02/11/18 1649    Azalia Bilis, MD 02/13/18 [phone]

## 2018-02-11 NOTE — Progress Notes (Addendum)
Pt having 10/10 R sided/medial chest pain. EKG obtained. Gave 3 SL nitro and placed on 2L O2 with no relief. MD on call notified. Will continue to monitor.

## 2018-02-11 NOTE — ED Notes (Signed)
Phone call received from [facility] stating they are Pt's PCP and insurance company.  Requesting phone call once Pt is discharged at [phone]. States that Pt resides at [facility].

## 2018-02-11 NOTE — ED Notes (Signed)
Attempted to give report, receiving RN was on another call. Will return phone call

## 2018-02-11 NOTE — H&P (Signed)
Date: 02/11/2018               Patient Name:  Deanna Page MRN: [id number]  DOB: 15-Aug-1929 Age / Sex: 84 y.o., female   PCP: Jethro Bastos, MD         Medical Service: Internal Medicine Teaching Service         Attending Physician: Dr. Debe Coder, MD    First Contact: Dr. Judeth Cornfield, MD Pager: [phone]  Second Contact: Dr. Delma Officer Pager: [phone]       After Hours (After 5p/  First Contact Pager: [phone]  weekends / holidays): Second Contact Pager: [phone]   Chief Complaint: Chest Pain  History of Present Illness: Mrs.Lybrand is a 84 yo F w/ HTN, TIA, Depression, Hypothyroidism, Parkinsons, Dementia, CKD3b and obstructive sleep apnea presenting with chest pain. She was examined and evaluated at bedside with husband and daughter present. She was observed laying in bed with tremors but husband confirms she has baseline resting tremors. She also has significant dysarthria but AAOx2 to name and date and able to answer questions and follow directions. She states she was in her usual state of health at [facility] where she resides eating breakfast when at the end of her meal, she noted sharp, stabbing chest pain without any radiation with her heart racing. Her husband called the RN for evaluation and EMS was called for chest pain. She states she does have heartburn at baseline but never had this kind of pain in the past. She mentions that she has no known cardiac disease. She mentions that her chest pain has currently resolved after receiving some meds in the ED. Denies any F/N/V. Denies any headache, blurry vision, hemoptysis, dyspnea or cough. Per husband, at baseline she has trouble with short term memory and cannot feed herself due to dysphagia. She has baseline tremor and tachycardia.   EMS gave her nitro and asa at [facility] which appeared to have improved her pain. In the ED, she was found to have initial troponin of 0.01. EKG showed no ischemic changes. She had an  elevated temp at 101.1. Chest X-ray showed no lobar consolidations. UA had small leukocytes but no nitrites, no bacteria.   Meds:  Current Meds  Medication Sig  . acetaminophen (TYLENOL) 325 MG tablet Take 650 mg by mouth 2 (two) times daily as needed for moderate pain.  Marland Kitchen atenolol (TENORMIN) 25 MG tablet Take 25 mg by mouth 2 (two) times daily.   . carbidopa-levodopa (SINEMET IR) 25-100 MG tablet Take 1.5 tablets by mouth 4 (four) times daily.  . Carbidopa-Levodopa ER (SINEMET CR) 25-100 MG tablet controlled release Take 1 tablet by mouth daily.  . citalopram (CELEXA) 20 MG tablet Take 1 tablet (20 mg total) by mouth at bedtime.  . clonazePAM (KLONOPIN) 0.5 MG tablet Take 0.25 mg by mouth 3 (three) times daily.   . fluorometholone (FML) 0.1 % ophthalmic suspension Place 1 drop into the left eye daily.  Marland Kitchen levothyroxine (SYNTHROID, LEVOTHROID) 50 MCG tablet Take 50 mcg by mouth daily before breakfast.  . memantine (NAMENDA) 10 MG tablet Take 10 mg by mouth 2 (two) times daily.   . Mouthwashes (BIOTENE DRY MOUTH MT) Use as directed in the mouth or throat 2 (two) times daily.  . Nutritional Supplements (ENSURE ORIGINAL) LIQD Take 8 oz by mouth 2 (two) times daily.   . pantoprazole (PROTONIX) 20 MG tablet Take 20 mg by mouth daily.   Marland Kitchen perphenazine (TRILAFON)  4 MG tablet Take 4 mg by mouth 2 (two) times daily.  . polyvinyl alcohol (LIQUIFILM TEARS) 1.4 % ophthalmic solution Place 1 drop into both eyes 4 (four) times daily as needed for dry eyes.   Marland Kitchen. sennosides-docusate sodium (SENOKOT-S) 8.6-50 MG tablet Take 2 tablets by mouth at bedtime.  . sodium chloride (MURO 128) 5 % ophthalmic ointment Place 1 application into the right eye at bedtime. For hx of Corneal transplant  . sodium fluoride (PREVIDENT 5000 PLUS) 1.1 % CREA dental cream Place 1 application onto teeth every evening.  . triazolam (HALCION) 0.25 MG tablet Take 0.125 mg by mouth at bedtime.   Marland Kitchen. trimethoprim (TRIMPEX) 100 MG tablet Take  100 mg by mouth daily.  . Vitamin D, Cholecalciferol, 1000 units CAPS Take 1 capsule by mouth daily.     Allergies: Allergies as of 02/11/2018 - Review Complete 02/11/2018  Allergen Reaction Noted  . Erythromycin  09/02/2006  . Penicillins Rash and Shortness Of Breath 09/02/2006  . Aricept [donepezil hcl]  05/22/2014  . Clindamycin/lincomycin Itching 02/21/2017  . Doxycycline  05/22/2014  . Pneumococcal vaccines  05/22/2014   Past Medical History:  Diagnosis Date  . Allergic rhinitis   . Anxiety   . Asthma   . Bowel obstruction (HCC)   . CKD (chronic kidney disease), stage III (HCC)   . Dementia (HCC)   . Diverticulosis   . Esophageal dysmotility   . Esophageal stricture 2005  . GERD (gastroesophageal reflux disease)   . Hepatitis A   . Hiatal hernia   . Hypertension   . Hypothyroidism   . Internal hemorrhoids   . Irritable bowel syndrome   . Ischemic colitis, enteritis, or enterocolitis (HCC) 2010  . Major depression    with paranoia and insomnia  . OSA (obstructive sleep apnea)   . Parkinson disease (HCC)   . Pneumonia   . TIA (transient ischemic attack)   . Urinary incontinence   . Vitamin D deficiency     Family History:  No significant early heart disease in 1st degree relatives. Has strong family history of Parkinson's Disease. Family History  Problem Relation Age of Onset  . Stroke Mother   . Parkinson's disease Mother   . Lung cancer Father   . ALS Sister   . Liver disease Sister   . Parkinson's disease Maternal Grandmother   . Parkinson's disease Maternal Aunt   . Emphysema Sister   . Emphysema Brother   . Colon cancer Neg Hx   . Colon polyps Neg Hx   . Diabetes Neg Hx   . Gallbladder disease Neg Hx   . Esophageal cancer Neg Hx    Social History:  Used to work at a bank. Currently retired. Denies any tobacco, illicit substance use. Used to drink wine now and then but not any more. Had son and daughter. Son is power of attorney and she has a  living will.  Review of Systems: A complete ROS was negative except as per HPI.  Physical Exam: Blood pressure 104/80, pulse 65, temperature (!) 101.1 F (38.4 C), temperature source Rectal, resp. rate 20, SpO2 100 %. Physical Exam  Constitutional: She is well-developed, well-nourished, and in no distress. No distress.  HENT:  Head: Normocephalic and atraumatic.  Mouth/Throat: Oropharynx is clear and moist.  Oral thrush on roof of mouth  Eyes: Pupils are equal, round, and reactive to light. Conjunctivae and EOM are normal. No scleral icterus.  Neck: Normal range of motion. Neck supple. No  JVD present.  Cardiovascular: Regular rhythm and intact distal pulses.  No murmur heard. Tachycardic at HR 100~110  Pulmonary/Chest: Effort normal and breath sounds normal. She has no wheezes. She has no rales.  Abdominal: Soft. Bowel sounds are normal. There is no tenderness.  Musculoskeletal: Normal range of motion. She exhibits no edema or tenderness.  No chest wall tenderness  Neurological:  AAOx2. Generalized resting tremors. Facial fasciculations R>L  Skin: Skin is warm and dry. She is not diaphoretic.  Multiple ecchymosis on upper extremities appear to be around IV sites     EKG: personally reviewed my interpretation is difficult to interpret, artifacts on multiple leads (I, AVR, AVL) due to tremors but appear normal sinus.  CXR: personally reviewed my interpretation is normal mediastinum, clear lungs, no lobar consolidation, pleural effusion, vascular congestion.  Assessment & Plan by Problem: Active Problems:   * No active hospital problems. *  Mrs.Rance is a 84 yo F w/ HTN, TIA, Depression, Hypothyroidism, Parkinsons, Dementia, CKD3b and obstructive sleep apnea presenting with atypical chest pain occurring after meal.  Has history of GERD on PPI at home.  No significant cardiac history. Most likely esophageal spasm or GERD but will trend troponin to rule out ACS.  One episode of  recorded elevated temperature in the ED but no other obvious sign of infection.  Could possibly be a viral illness or inaccurate reading.  Atypical Chest pain 2/2 GERD vs Unstable Angina Sharp sternal pain during meal. Initial troponin of 0.01. EKG showed no ischemic changes. Heart score 3. No hx of cardiac disease but hx of TIA. Barium swallow in 2016 showed possible esophageal stricture. Last EGD in 2016 showed mild gastritis without obvious esophageal changes. - Trend trop x3 - AM EKG - Telemetry - C/w home meds: pantoprazole 20mg  daily  Elevated temp 2/2 viral illness? 101.80F recorded by rectal temp in ED. Lactate negative. Wbc 7.4 - Trend fever curve - Tylenol for temp >101.3  Dysphagia 2/2 Parkinsons Disease vs Oral candidiasis Baseline has frequent choking and difficulty swallowing food. On soft diet at [employer]. NO  - NPO until Speech and swallow evaluation - Nystatin PO after speech and swallow - Maintenance fluid at NS 75cc/hr - C/w home meds: Sinemet 25-100mg  four times daily  HTN On atenolol at home. Admit bp 104/80 - Hold home meds for soft pressure  Hypothyroidism No recent TSH on file - Check TSH - c/w home meds: Levothyroxine 50mg  daily  Hx of Dementia - C/w home meds: memantine 10mg  BID, klonopin 0.25mg  TID, citalopram 20mg  qhs, triazolam 0.125mg  qhs  Dispo: Admit patient to Inpatient with expected length of stay greater than 2 midnights.  Signed: Judeth Cornfield, MD 02/11/2018, 7:40PM Pager: [phone]

## 2018-02-12 ENCOUNTER — Other Ambulatory Visit (HOSPITAL_COMMUNITY): Payer: Self-pay

## 2018-02-12 DIAGNOSIS — R509 Fever, unspecified: Secondary | ICD-10-CM

## 2018-02-12 DIAGNOSIS — N183 Chronic kidney disease, stage 3 (moderate): Secondary | ICD-10-CM

## 2018-02-12 DIAGNOSIS — Z880 Allergy status to penicillin: Secondary | ICD-10-CM

## 2018-02-12 DIAGNOSIS — Z8673 Personal history of transient ischemic attack (TIA), and cerebral infarction without residual deficits: Secondary | ICD-10-CM

## 2018-02-12 DIAGNOSIS — Z888 Allergy status to other drugs, medicaments and biological substances status: Secondary | ICD-10-CM

## 2018-02-12 DIAGNOSIS — Z887 Allergy status to serum and vaccine status: Secondary | ICD-10-CM

## 2018-02-12 DIAGNOSIS — Z881 Allergy status to other antibiotic agents status: Secondary | ICD-10-CM

## 2018-02-12 DIAGNOSIS — I129 Hypertensive chronic kidney disease with stage 1 through stage 4 chronic kidney disease, or unspecified chronic kidney disease: Secondary | ICD-10-CM

## 2018-02-12 DIAGNOSIS — R1310 Dysphagia, unspecified: Secondary | ICD-10-CM

## 2018-02-12 DIAGNOSIS — G20 Parkinson's disease: Secondary | ICD-10-CM

## 2018-02-12 DIAGNOSIS — F0280 Dementia in other diseases classified elsewhere without behavioral disturbance: Secondary | ICD-10-CM

## 2018-02-12 DIAGNOSIS — R0789 Other chest pain: Secondary | ICD-10-CM

## 2018-02-12 DIAGNOSIS — E039 Hypothyroidism, unspecified: Secondary | ICD-10-CM

## 2018-02-12 DIAGNOSIS — F329 Major depressive disorder, single episode, unspecified: Secondary | ICD-10-CM

## 2018-02-12 DIAGNOSIS — B370 Candidal stomatitis: Secondary | ICD-10-CM

## 2018-02-12 DIAGNOSIS — K219 Gastro-esophageal reflux disease without esophagitis: Principal | ICD-10-CM

## 2018-02-12 LAB — TROPONIN I: Troponin I: <0.03 ng/mL (ref <0.03)

## 2018-02-12 LAB — BASIC METABOLIC PANEL
ANION GAP: 10 (ref 5–15)
BUN: 22 mg/dL (ref 8–23)
CO2: 23 mmol/L (ref 22–32)
Calcium: 9.3 mg/dL (ref 8.9–10.3)
Chloride: 105 mmol/L (ref 98–111)
Creatinine, Ser: 1.24 mg/dL — ABNORMAL HIGH (ref 0.44–1.00)
GFR calc Af Amer: 46 mL/min — ABNORMAL LOW (ref >60)
GFR calc non Af Amer: 39 mL/min — ABNORMAL LOW (ref >60)
Glucose, Bld: 90 mg/dL (ref 70–99)
Potassium: 4.1 mmol/L (ref 3.5–5.1)
Sodium: 138 mmol/L (ref 135–145)

## 2018-02-12 LAB — CBC
HCT: 36.1 % (ref 36.0–46.0)
HEMOGLOBIN: 11.2 g/dL — AB (ref 12.0–15.0)
MCH: 30.3 pg (ref 26.0–34.0)
MCHC: 31 g/dL (ref 30.0–36.0)
MCV: 97.6 fL (ref 80.0–100.0)
Platelets: 186 10*3/uL (ref 150–400)
RBC: 3.7 MIL/uL — ABNORMAL LOW (ref 3.87–5.11)
RDW: 13.6 % (ref 11.5–15.5)
WBC: 5.7 10*3/uL (ref 4.0–10.5)
nRBC: 0 % (ref 0.0–0.2)

## 2018-02-12 LAB — URINE CULTURE

## 2018-02-12 LAB — TSH: TSH: 1.682 u[IU]/mL (ref 0.350–4.500)

## 2018-02-12 MED ORDER — GI COCKTAIL ~~LOC~~: 30.0000 mL | Freq: Two times a day (BID) | ORAL | 0 refills | Status: DC | PRN

## 2018-02-12 MED ORDER — PANTOPRAZOLE SODIUM 20 MG PO TBEC: 40.0000 mg | DELAYED_RELEASE_TABLET | Freq: Every day | ORAL | 0 refills | Status: DC

## 2018-02-12 NOTE — Evaluation (Signed)
Clinical/Bedside Swallow Evaluation Patient Details  Name: Briant Sitesvelyn W Everitt MRN: [id number] Date of Birth: 05-03-1929  Today's Date: 02/12/2018 Time: SLP Start Time (ACUTE ONLY): 0935 SLP Stop Time (ACUTE ONLY): 0953 SLP Time Calculation (min) (ACUTE ONLY): 18 min  Past Medical History:  Past Medical History:  Diagnosis Date  . Allergic rhinitis   . Anxiety   . Asthma   . Bowel obstruction (HCC)   . CKD (chronic kidney disease), stage III (HCC)   . Dementia (HCC)   . Diverticulosis   . Esophageal dysmotility   . Esophageal stricture 2005  . GERD (gastroesophageal reflux disease)   . Hepatitis A   . Hiatal hernia   . Hypertension   . Hypothyroidism   . Internal hemorrhoids   . Irritable bowel syndrome   . Ischemic colitis, enteritis, or enterocolitis (HCC) 2010  . Major depression    with paranoia and insomnia  . OSA (obstructive sleep apnea)   . Parkinson disease (HCC)   . Pneumonia   . TIA (transient ischemic attack)   . Urinary incontinence   . Vitamin D deficiency    Past Surgical History:  Past Surgical History:  Procedure Laterality Date  . ABDOMINAL HYSTERECTOMY  1969  . APPENDECTOMY  1969  . BLADDER SURGERY    . BREAST LUMPECTOMY  1995  . CATARACT EXTRACTION    . CHOLECYSTECTOMY  2000  . CORNEAL TRANSPLANT Left   . INTRAOCULAR PROSTHESES INSERTION     [facility] eye [facility]  . TUBAL LIGATION  1968   HPI:  Mrs.Lopp is a 84 yo PMH: esophageal dysmotility, esophageal stricture, HTN, TIA, Depression, Hypothyroidism, Parkinsons, Dementia, CKD3b and obstructive sleep apnea presenting with chest pain noted at the end of her meal. CXR No active cardiopulmonary disease. MBS 11/20/17 oral delay, trace flash penetration with thin d/t decreased timing, vallecular residue; pill lodged in valleculae   Assessment / Plan / Recommendation Clinical Impression  Pt has history of dysphagia with trace penetration and moderate vallecular residue during MBS 11/2017. Primarily  pharyngeal dysphagia demonstrated during observation with breakfast. She consumed sips water after 2-3 bites eggs with immediate and strong reflexive cough likely due to residual in valleculae mixing with thin liquids. She requires max cues to follow precautions to mitigate her chronic dysphagia which may worsen as Parkinson's progresses. Recommend downgrade to Dys 3, continue thin liquids, swallow twice and pills whole in applesauce and full supervision.       SLP Visit Diagnosis: Dysphagia, pharyngeal phase (R13.13)    Aspiration Risk       Diet Recommendation Dysphagia 3 (Mech soft);Thin liquid   Liquid Administration via: Cup;Straw Medication Administration: Whole meds with puree Supervision: Patient able to self feed;Full supervision/cueing for compensatory strategies Compensations: Slow rate;Small sips/bites;Minimize environmental distractions;Lingual sweep for clearance of pocketing Postural Changes: Seated upright at 90 degrees;Remain upright for at least 30 minutes after po intake    Other  Recommendations Oral Care Recommendations: Oral care BID   Follow up Recommendations Other (comment)(TBD)      Frequency and Duration min 2x/week  2 weeks       Prognosis Prognosis for Safe Diet Advancement: (fair-good) Barriers to Reach Goals: Cognitive deficits      Swallow Study   General HPI: Mrs.Femia is a 84 yo PMH: esophageal dysmotility, esophageal stricture, HTN, TIA, Depression, Hypothyroidism, Parkinsons, Dementia, CKD3b and obstructive sleep apnea presenting with chest pain noted at the end of her meal. CXR No active cardiopulmonary disease. MBS 11/20/17 oral delay, trace  flash penetration with thin d/t decreased timing, vallecular residue; pill lodged in valleculae Type of Study: Bedside Swallow Evaluation Previous Swallow Assessment: (SEE IMPRESSION) Diet Prior to this Study: Regular;Thin liquids Temperature Spikes Noted: No Respiratory Status: Room air History of  Recent Intubation: No Behavior/Cognition: Alert;Cooperative;Requires cueing Oral Cavity Assessment: Within Functional Limits Oral Care Completed by SLP: No Oral Cavity - Dentition: Adequate natural dentition Vision: Functional for self-feeding Self-Feeding Abilities: Needs set up;Needs assist Patient Positioning: Upright in bed Baseline Vocal Quality: Low vocal intensity Volitional Cough: Weak Volitional Swallow: Able to elicit    Oral/Motor/Sensory Function Overall Oral Motor/Sensory Function: Mild impairment(decreased ROM d/t Parkinsons)   Ice Chips Ice chips: Not tested   Thin Liquid Thin Liquid: Impaired Presentation: Cup;Straw Pharyngeal  Phase Impairments: Cough - Immediate    Nectar Thick Nectar Thick Liquid: Not tested   Honey Thick Honey Thick Liquid: Not tested   Puree Puree: Within functional limits   Solid     Solid: Within functional limits      Roque Cash, Breck Coons 02/12/2018,12:17 PM  Breck Coons [place].Ed Nurse, children's [phone] Office [phone]

## 2018-02-12 NOTE — Discharge Instructions (Signed)
Deanna Page  You came to us with chest pain. We did lab tests which showed that you are not having a heart attack. We believe your pain may be more related to your stomach. We suggest that you follow up with your stomach doctor.   START pantoprazole 40mg  daily START GI cocktail 30cc BID as needed for abdominal pain  Thank you for choosing [facility]   Gastroesophageal Reflux Disease, Adult Normally, food travels down the esophagus and stays in the stomach to be digested. If a person has gastroesophageal reflux disease (GERD), food and stomach acid move back up into the esophagus. When this happens, the esophagus becomes sore and swollen (inflamed). Over time, GERD can make small holes (ulcers) in the lining of the esophagus. Follow these instructions at home: Diet  Follow a diet as told by your doctor. You may need to avoid foods and drinks such as: ? Coffee and tea (with or without caffeine). ? Drinks that contain alcohol. ? Energy drinks and sports drinks. ? Carbonated drinks or sodas. ? Chocolate and cocoa. ? Peppermint and mint flavorings. ? Garlic and onions. ? Horseradish. ? Spicy and acidic foods, such as peppers, chili powder, curry powder, vinegar, hot sauces, and BBQ sauce. ? Citrus fruit juices and citrus fruits, such as oranges, lemons, and limes. ? Tomato-based foods, such as red sauce, chili, salsa, and pizza with red sauce. ? Fried and fatty foods, such as donuts, french fries, potato chips, and high-fat dressings. ? High-fat meats, such as hot dogs, rib eye steak, sausage, ham, and bacon. ? High-fat dairy items, such as whole milk, butter, and cream cheese.  Eat small meals often. Avoid eating large meals.  Avoid drinking large amounts of liquid with your meals.  Avoid eating meals during the 2-3 hours before bedtime.  Avoid lying down right after you eat.  Do not exercise right after you eat. General instructions  Pay attention to any changes in your  symptoms.  Take over-the-counter and prescription medicines only as told by your doctor. Do not take aspirin, ibuprofen, or other NSAIDs unless your doctor says it is okay.  Do not use any tobacco products, including cigarettes, chewing tobacco, and e-cigarettes. If you need help quitting, ask your doctor.  Wear loose clothes. Do not wear anything tight around your waist.  Raise (elevate) the head of your bed about 6 inches (15 cm).  Try to lower your stress. If you need help doing this, ask your doctor.  If you are overweight, lose an amount of weight that is healthy for you. Ask your doctor about a safe weight loss goal.  Keep all follow-up visits as told by your doctor. This is important. Contact a doctor if:  You have new symptoms.  You lose weight and you do not know why it is happening.  You have trouble swallowing, or it hurts to swallow.  You have wheezing or a cough that keeps happening.  Your symptoms do not get better with treatment.  You have a hoarse voice. Get help right away if:  You have pain in your arms, neck, jaw, teeth, or back.  You feel sweaty, dizzy, or light-headed.  You have chest pain or shortness of breath.  You throw up (vomit) and your throw up looks like blood or coffee grounds.  You pass out (faint).  Your poop (stool) is bloody or black.  You cannot swallow, drink, or eat. This information is not intended to replace advice given to you by your health  care provider. Make sure you discuss any questions you have with your health care provider. Document Released: 08/07/2007 Document Revised: 07/27/2015 Document Reviewed: 06/15/2014 Elsevier Interactive Patient Education  [employer].

## 2018-02-12 NOTE — NC FL2 (Signed)
[place] MEDICAID FL2 LEVEL OF CARE SCREENING TOOL     IDENTIFICATION  Patient Name: Deanna Page Birthdate: 1929-08-10 Sex: female Admission Date (Current Location): 02/11/2018  [facility] [place]   Facility and Address:  [facility] [place], [address], [place], [place] [address]      Provider Number: [id number]  Attending Physician Name and Address:  Inez CatalinaMullen, Emily B, MD  Relative Name and Phone Number:  Leanna BattlesMichele Peraldo, daughter, [phone]    Current Level of Care: Hospital Recommended Level of Care: Skilled Nursing Facility Prior Approval Number:    Date Approved/Denied:   PASRR Number: [id number]  Discharge Plan: SNF    Current Diagnoses: Patient Active Problem List   Diagnosis Date Noted  . Chest pain 02/11/2018  . Suicidal ideation   . Altered mental status 12/30/2016  . UTI (urinary tract infection) 12/30/2016  . Dementia (HCC) 12/30/2016  . Parkinson's disease (HCC) 03/01/2013  . GASTRITIS 02/22/2010  . ABDOMINAL PAIN-EPIGASTRIC 02/22/2010  . DIVERTICULOSIS-COLON 12/19/2008  . OTHER DYSPHAGIA 10/07/2007  . TIA 01/21/2007  . DIARRHEA 01/21/2007  . GAIT DISTURBANCE 01/01/2007  . GASTROESOPHAGEAL REFLUX DISEASE 12/20/2006  . HYSTERECTOMY, HX OF 12/20/2006  . APPENDECTOMY, HX OF 12/20/2006  . SYNCOPE 10/01/2006  . SYMPTOM, MEMORY LOSS 09/16/2006  . BRONCHITIS, ACUTE 06/30/2006  . DEPRESSION 06/02/2006  . HYPERTENSION 06/02/2006  . ALLERGIC RHINITIS 06/02/2006  . ASTHMA 06/02/2006  . PROLAPSE, VAGINAL WALL, RECTOCELE 06/02/2006  . DIZZINESS OR VERTIGO 06/02/2006  . URINARY INCONTINENCE 06/02/2006  . PNEUMONIA, HX OF 06/02/2006  . BLADDER REPAIR, HX OF 06/02/2006  . INSOMNIA, HX OF 06/02/2006    Orientation RESPIRATION BLADDER Height & Weight     Self, Time, Situation, Place  Normal External catheter, Continent Weight: 55.4 kg Height:     BEHAVIORAL SYMPTOMS/MOOD NEUROLOGICAL BOWEL NUTRITION  STATUS      Continent Diet(please see DC summary)  AMBULATORY STATUS COMMUNICATION OF NEEDS Skin   (baseline) Verbally Normal                       Personal Care Assistance Level of Assistance  (baseline)           Functional Limitations Info  Sight, Hearing, Speech Sight Info: Adequate Hearing Info: Adequate Speech Info: Adequate    SPECIAL CARE FACTORS FREQUENCY                       Contractures Contractures Info: Not present    Additional Factors Info  Psychotropic, Code Status, Allergies Code Status Info: Full Allergies Info: Erythromycin, Penicillins, Aricept Donepezil Hcl, Clindamycin/lincomycin, Doxycycline, Pneumococcal Vaccines Psychotropic Info: namenda, klonopin, celexa         Current Medications (02/12/2018):  This is the current hospital active medication list Current Facility-Administered Medications  Medication Dose Route Frequency Provider Last Rate Last Dose  . 0.9 %  sodium chloride infusion   Intravenous Continuous Inez CatalinaMullen, Emily B, MD 10 mL/hr at 02/12/18 0500    . acetaminophen (TYLENOL) tablet 650 mg  650 mg Oral Q6H PRN Chundi, Vahini, MD       Or  . acetaminophen (TYLENOL) suppository 650 mg  650 mg Rectal Q6H PRN Chundi, Vahini, MD      . carbidopa-levodopa (SINEMET IR) 25-100 MG per tablet immediate release 1.5 tablet  1.5 tablet Oral QID Chundi, Vahini, MD   1.5 tablet at 02/12/18 1052  . citalopram (CELEXA) tablet 20 mg  20 mg  Oral QHS Chundi, Vahini, MD      . clonazePAM Scarlette Calico) disintegrating tablet 0.25 mg  0.25 mg Oral TID Lorenso Courier, MD   0.25 mg at 02/12/18 0938  . enoxaparin (LOVENOX) injection 30 mg  30 mg Subcutaneous Q24H Chundi, Vahini, MD   30 mg at 02/12/18 0941  . ketorolac (TORADOL) 15 MG/ML injection 15 mg  15 mg Intravenous Once [place], Carley D, DO      . levothyroxine (SYNTHROID, LEVOTHROID) tablet 50 mcg  50 mcg Oral QAC breakfast Lorenso Courier, MD   50 mcg at 02/12/18 0603  . memantine (NAMENDA)  tablet 10 mg  10 mg Oral BID Lorenso Courier, MD   10 mg at 02/12/18 0938  . nitroGLYCERIN (NITROSTAT) SL tablet 0.4 mg  0.4 mg Sublingual Q5 min PRN Debe Coder B, MD      . pantoprazole (PROTONIX) EC tablet 20 mg  20 mg Oral Daily Chundi, Vahini, MD   20 mg at 02/12/18 0938  . promethazine (PHENERGAN) tablet 12.5 mg  12.5 mg Oral Q6H PRN Chundi, Vahini, MD      . senna-docusate (Senokot-S) tablet 1 tablet  1 tablet Oral QHS PRN Chundi, Vahini, MD      . triazolam (HALCION) tablet 0.125 mg  0.125 mg Oral QHS Chundi, Vahini, MD   0.125 mg at 02/11/18 2205     Discharge Medications: Please see discharge summary for a list of discharge medications.  Relevant Imaging Results:  Relevant Lab Results:   Additional Information SSN: [id number]  Abigail Butts, LCSW

## 2018-02-12 NOTE — Discharge Summary (Signed)
Name: Deanna Page MRN: [id number] DOB: June 30, 1929 84 y.o. PCP: Jethro Bastos, MD  Date of Admission: 02/11/2018  2:20 PM Date of Discharge: 02/12/2018 Attending Physician: Inez Catalina, MD  Discharge Diagnosis: 1. Atypical Chest Pain 2. GERD  Discharge Medications: Allergies as of 02/12/2018      Reactions   Erythromycin    Other reaction(s): Other (See Comments) Other Reaction: GI Upset Unknown reaction   Penicillins Rash, Shortness Of Breath   Unknown reaction Has patient had a PCN reaction causing immediate rash, facial/tongue/throat swelling, SOB or lightheadedness with hypotension: Unknown Has patient had a PCN reaction causing severe rash involving mucus membranes or skin necrosis: Unknown Has patient had a PCN reaction that required hospitalization: Unknown Has patient had a PCN reaction occurring within the last 10 years: Unknown If all of the above answers are "NO", then may proceed with Cephalosporin use.   Aricept [donepezil Hcl]    Unknown reaction   Clindamycin/lincomycin Itching   Doxycycline    Unknown reaction   Pneumococcal Vaccines    Unknown reaction      Medication List    STOP taking these medications   perphenazine 4 MG tablet Commonly known as:  TRILAFON     TAKE these medications   acetaminophen 325 MG tablet Commonly known as:  TYLENOL Take 650 mg by mouth 2 (two) times daily as needed for moderate pain.   atenolol 25 MG tablet Commonly known as:  TENORMIN Take 25 mg by mouth 2 (two) times daily.   BIOTENE DRY MOUTH MT Use as directed in the mouth or throat 2 (two) times daily.   carbidopa-levodopa 25-100 MG tablet Commonly known as:  SINEMET IR Take 1.5 tablets by mouth 4 (four) times daily. What changed:  Another medication with the same name was removed. Continue taking this medication, and follow the directions you see here.   citalopram 20 MG tablet Commonly known as:  CELEXA Take 1 tablet (20 mg total) by  mouth at bedtime.   clonazePAM 0.5 MG tablet Commonly known as:  KLONOPIN Take 0.25 mg by mouth 3 (three) times daily.   ENSURE ORIGINAL Liqd Take 8 oz by mouth 2 (two) times daily.   fluorometholone 0.1 % ophthalmic suspension Commonly known as:  FML Place 1 drop into the left eye daily.   gi cocktail Susp suspension Take 30 mLs by mouth 2 (two) times daily as needed for indigestion (Abdominal pain). Shake well.   levothyroxine 50 MCG tablet Commonly known as:  SYNTHROID, LEVOTHROID Take 50 mcg by mouth daily before breakfast.   memantine 10 MG tablet Commonly known as:  NAMENDA Take 10 mg by mouth 2 (two) times daily.   pantoprazole 20 MG tablet Commonly known as:  PROTONIX Take 2 tablets (40 mg total) by mouth daily. What changed:  how much to take   polyvinyl alcohol 1.4 % ophthalmic solution Commonly known as:  LIQUIFILM TEARS Place 1 drop into both eyes 4 (four) times daily as needed for dry eyes.   PREVIDENT 5000 PLUS 1.1 % Crea dental cream Generic drug:  sodium fluoride Place 1 application onto teeth every evening.   sennosides-docusate sodium 8.6-50 MG tablet Commonly known as:  SENOKOT-S Take 2 tablets by mouth at bedtime.   sodium chloride 5 % ophthalmic ointment Commonly known as:  MURO 128 Place 1 application into the right eye at bedtime. For hx of Corneal transplant   triazolam 0.25 MG tablet Commonly known as:  HALCION Take 0.125  mg by mouth at bedtime.   trimethoprim 100 MG tablet Commonly known as:  TRIMPEX Take 100 mg by mouth daily.   Vitamin D (Cholecalciferol) 25 MCG (1000 UT) Caps Take 1 capsule by mouth daily.       Disposition and follow-up:   [name] was discharged from [facility] in Stable condition.  At the hospital follow up visit please address:  1.  Atypical Chest Pain:  - Presented w/ chest pain - EKG no ischemic changes x 2. Troponin negative x3 - Chest pain self-resolved w/ GI cocktail  and Toradol - Ensure pain has not re-occurred  2. GERD: - Chest pain though to be 2/2 reflux disease - Discharged with increased dose of pantoprazole and GI cocktail PRN - Please ensure she is able to tolerate these meds long term and adjust as needed  2.  Labs / imaging needed at time of follow-up: None  3.  Pending labs/ test needing follow-up: None  Follow-up Appointments: Follow-up Information    Jethro Bastos, MD. Call.   Specialty:  Family Medicine Contact information: [address] [place] [address] [phone]           Hospital Course by problem list: 1. Atypical Chest Pain: Presented with stabbing chest pain.  EKG showed no ischemic changes.  Chest x-ray normal.  Troponins n negative x3.  Had abdominal tenderness to deep palpation.  Thought to be reflux related considering past history of gastritis and GERD.  Had a reoccurrence of chest pain overnight.  Resolved with GI cocktail.  Repeat EKG showed no ischemic changes.  Discharged back to ALF with increased dose of pantoprazole, gi cocktail prn and recommendation to follow-up with primary care provider.  Discharge Vitals:   BP (!) 155/72 (BP Location: Left Arm)   Pulse (!) 51   Temp 97.7 F (36.5 C) (Oral)   Resp (!) 21   Wt 55.4 kg   SpO2 99%   BMI 21.64 kg/m   Pertinent Labs, Studies, and Procedures:  Troponin (Point of Care Test) Recent Labs    02/11/18 1445  TROPIPOC 0.01   BMP Latest Ref Rng & Units 02/12/2018 02/11/2018 02/21/2017  Glucose 70 - 99 mg/dL 90 213(Y) 865(H)  BUN 8 - 23 mg/dL 22 22 84(O)  Creatinine 0.44 - 1.00 mg/dL 9.62(X) 5.28(U) 1.32(G)  Sodium 135 - 145 mmol/L 138 137 136  Potassium 3.5 - 5.1 mmol/L 4.1 3.8 3.8  Chloride 98 - 111 mmol/L 105 106 104  CO2 22 - 32 mmol/L 23 20(L) 23  Calcium 8.9 - 10.3 mg/dL 9.3 9.1 9.7   Discharge Instructions: Deanna Page  You came to [place] with chest pain. We did lab tests which showed that you are not having a heart attack. We  believe your pain may be more related to your stomach. We suggest that you follow up with your stomach doctor.   START pantoprazole 40mg  daily START GI cocktail 30cc BID as needed for abdominal pain  Thank you for choosing [facility]  Discharge Instructions    Call MD for:  difficulty breathing, headache or visual disturbances   Complete by:  As directed    Call MD for:  persistant dizziness or light-headedness   Complete by:  As directed    Call MD for:  persistant nausea and vomiting   Complete by:  As directed    Call MD for:  severe uncontrolled pain   Complete by:  As directed    Diet -  low sodium heart healthy   Complete by:  As directed    Increase activity slowly   Complete by:  As directed      Signed: Theotis BarrioLee, Guy Toney K, MD 02/12/2018, 12:04 PM   Pager: [phone]

## 2018-02-12 NOTE — Progress Notes (Signed)
   Subjective:  Deanna Page Foss is a 84 y.o. with PMH of HTN, TIA, Depression, Hypothyroidism, Parkinson's Disease, Dementia, GERD and CKD3 admit for chest pain on hospital day 1  Ms.Poznanski was examined and evaluated at bedside this AM. She states she had another episode of chest pain last night but it has resolved shortly after receiving GI cocktail. Denies any F/N/V/D/C. Explained to her that her chest pain appear to not be due to a heart attack and she is expected to be discharged back to [facility]. She expressed understanding and agrees with the plan.  Objective:  Vital signs in last 24 hours: Vitals:   02/11/18 2037 02/11/18 2143 02/11/18 2148 02/12/18 0631  BP: (!) 145/82 (!) 147/71 (!) 142/82 (!) 155/72  Pulse: 100 (!) 57 (!) 57 (!) 51  Resp: (!) 21     Temp: (!) 97.2 F (36.2 C)   97.7 F (36.5 C)  TempSrc: Axillary   Oral  SpO2:      Weight:    55.4 kg   Gen: Well-developed, well nourished, NAD HEENT: NCAT head, hearing intact, EOMI, PERRL, No nasal discharge, MMM, No obvious oral thrush visualized. Neck: supple, no JVD, no cervical adenopathy CV: RRR, S1, S2 normal, No rubs, no murmurs, no gallops Pulm: CTAB, No rales, no wheezes, no dullness to percussion  Abd: Soft, BS+, Epigastric tenderness to deep palpation, No rebound, no guarding Extm: ROM intact, Peripheral pulses intact, No peripheral edema Skin: Dry, Warm, normal turgor, ecchymosis on back of the hands near prior IV sites.  Neuro: AAOx2, resting tremors Psych: Normal mood and affect  Assessment/Plan:  Active Problems:   Chest pain  Deanna Page Deanna Page is a 84 y.o. with PMH of HTN, TIA, Depression, Hypothyroidism, Parkinson's Disease, GERD, Dementia, and CKD3 admit for chest pain.  ACS ruled out with repeat EKGs Page/o changes and negative troponins. Chart review reveals history of mild gastritis on prior EDG and she is on low dose PPI at home. Chest pain etiology most likely due to heartburn. Will increase  her PPI dose and discharge with PRN Gi cocktails.  Atypical Chest Pain 2/2 GERD Repeat EKGs show no ischemic changes. Troponin negative x3 - Pantoprazole 40mg  - GI cocktail PRN for pain - Discharge back to [facility] today  Elevated temp in ED No leukocytosis, No further fever, no lactate. Possibly inaccurate reading - Resolved  Dysphagia 2/2 Parkinsons Disease Speech and swallow evaluation confirm baseline dysphagia with tolerating soft foods. No evidence of oral thrush may have been food particles during initial exam. - At baseline, safe to go back to ALF  DVT prophx: Lovenox Diet: Soft foods Bowel: senokot Code: Full  Dispo: Anticipated discharge in approximately today.   Theotis BarrioLee, Laryah Neuser K, MD 02/12/2018, 11:46 AM Pager: [phone]

## 2018-02-12 NOTE — Progress Notes (Signed)
Patient will discharge back to [facility]. Anticipated discharge date: 02/12/18 Family notified: Leanna BattlesMichele Peraldo, daughter Transportation by: [facility]  Nurse to call report to [phone].  CSW signing off.  Abigail ButtsSusan Tonnia Bardin, LCSWA  Clinical Social Worker

## 2018-02-12 NOTE — Progress Notes (Signed)
Report called to [facility].  Spoke to Sandy Valleyynthia, Charity fundraiserN.

## 2018-02-12 NOTE — Progress Notes (Signed)
  Date: 02/12/2018  [facility]  Medical record number: [id number]  Date of birth: October 26, 1929   I have seen and evaluated Briant SitesEvelyn W Cranshaw and discussed their care with the Residency Team. Briefly, Ms. Cananiss is an 84 year old woman with PD, dementia, CKD 3 who presented with acute sharp chest pain.  Initial evaluation for cardiac cause has ruled out acute MI.  The nature of her pain seems atypical and possibly caused by a GI source.  On evaluation this AM, she is now pain free.    PMHx, Fam Hx, and/or Soc Hx : She has a FH of stroke, PD, no CAD.  She is a never smoker.  Lives in SNF at this time.   Vitals:   02/11/18 2148 02/12/18 0631  BP: (!) 142/82 (!) 155/72  Pulse: (!) 57 (!) 51  Resp:    Temp:  97.7 F (36.5 C)  SpO2:     General: Elderly woman, masked facies Eyes: Anicteric sclerae HENT: Neck is supple, oral thrush is improved, some mild areas around tongue CV: NR, bradycardic, no murmur, + chest wall tenderness  Pulm: Clear to auscultation bilaterally, no wheezing Abd: + epigastric tenderness, +BS Ext: Thin, no edema Neuro: Generalized tremor, worse in hands/arms Skin: Ecchymoses around IV sites  Assessment and Plan: I have seen and evaluated the patient as outlined above. I agree with the formulated Assessment and Plan as detailed in the residents' note, with the following changes:   1. Atypical chest pain - Troponin X 3 for rule out - Increase PPI and add GI cocktail PRN when discharged - Telemetry review  2. Elevated temp in the ED - Monitor fever curve  3. H/O dysphagia - SLP evaluation pending, will update SNF with recommendations  Other issues per Dr. Marigene EhlersLee's daily note.   Inez CatalinaMullen, Betzalel Umbarger B, MD 12/12/20198:07 PM

## 2018-02-12 NOTE — Clinical Social Work Placement (Signed)
   CLINICAL SOCIAL WORK PLACEMENT  NOTE  Date:  02/12/2018  Patient Details  Name: Deanna Page MRN: [id number] Date of Birth: 04/27/1929  Clinical Social Work is seeking post-discharge placement for this patient at the Skilled  Nursing Facility level of care (*CSW will initial, date and re-position this form in  chart as items are completed):  Yes   Patient/family provided with [facility] Clinical Social Work Department's list of facilities offering this level of care within the geographic area requested by the patient (or if unable, by the patient's family).  Yes   Patient/family informed of their freedom to choose among providers that offer the needed level of care, that participate in Medicare, Medicaid or managed care program needed by the patient, have an available bed and are willing to accept the patient.  Yes   Patient/family informed of [facility]'s ownership interest in [facility] and [facility], as well as of the fact that they are under no obligation to receive care at these facilities.  PASRR submitted to EDS on       PASRR number received on       Existing PASRR number confirmed on 02/12/18     FL2 transmitted to all facilities in geographic area requested by pt/family on 02/12/18     FL2 transmitted to all facilities within larger geographic area on       Patient informed that his/her managed care company has contracts with or will negotiate with certain facilities, including the following:  [facility]     Yes   Patient/family informed of bed offers received.  Patient chooses bed at [facility]     Physician recommends and patient chooses bed at      [facility] on 02/12/18.  Patient to be transferred to facility by [facility]     Patient family notified on 02/12/18 of transfer.  Name of family member notified:  Leanna BattlesMichele Peraldo     PHYSICIAN Please prepare priority  discharge summary, including medications, Please prepare prescriptions, Please sign FL2     Additional Comment:    _______________________________________________ Abigail ButtsSusan Charlotte Fidalgo, LCSW 02/12/2018, 10:59 AM

## 2018-02-12 NOTE — Clinical Social Work Note (Signed)
Clinical Social Work Assessment  Patient Details  Name: Deanna Page MRN: [id number] Date of Birth: May 18, 1929  Date of referral:  02/12/18               Reason for consult:  Discharge Planning, Facility Placement                Permission sought to share information with:  Facility Sport and exercise psychologist, Family Supports Permission granted to share information::  Yes, Verbal Permission Granted  Name::     [name]  Agency::  [facility]  Relationship::  daughter  Contact Information:  [phone]  Housing/Transportation Living arrangements for the past 2 months:  [place], Power of Information:  Patient, Facility, Adult Children Patient Interpreter Needed:  None Criminal Activity/Legal Involvement Pertinent to Current Situation/Hospitalization:  No - Comment as needed Significant Relationships:  Adult Children Lives with:  Facility Resident Do you feel safe going back to the place where you live?  Yes Need for family participation in patient care:  Yes (Comment)  Care giving concerns: Patient from [facility].    Social Worker assessment / plan: CSW met with patient at bedside. Patient alert, though only gave brief yes/no answers to CSW. Patient confirmed she has been staying at [facility], but stated it has been for three years. Patient gave permission to speak to her daughter, [name].   CSW spoke to daughter on the phone. Daughter reported patient has been at the SNF for a few weeks and is agreeable for patient to return. CSW also spoke to patient's [facility] social worker, [name]. Ann indicated patient has been at [facility] since 11/20 and they agreeable to her returning to the facility. [name] also spoke to patient's husband, who is in agreement for patient to return to SNF. [facility] will provide transport back to SNF.  Confirmed SNF will accept patient back. CSW to support with discharge planning.  Employment status:  Retired Forensic psychologist) PT Recommendations:  Not assessed at this time Information / Referral to community resources:  [place]  Patient/Family's Response to care: Patient and family appreciative of care.  Patient/Family's Understanding of and Emotional Response to Diagnosis, Current Treatment, and Prognosis: Patient's daughter and [facility] SW understanding of patient's condition and agreeable for patient to return to SNF.  Emotional Assessment Appearance:  Appears stated age Attitude/Demeanor/Rapport:  Engaged Affect (typically observed):  Accepting, Calm Orientation:  Oriented to Self, Oriented to Place, Oriented to  Time, Oriented to Situation Alcohol / Substance use:  Not Applicable Psych involvement (Current and /or in the community):  No (Comment)  Discharge Needs  Concerns to be addressed:  Discharge Planning Concerns, Care Coordination Readmission within the last 30 days:  No Current discharge risk:  Physical Impairment Barriers to Discharge:  Continued Medical Work up   Estanislado Emms, LCSW 02/12/2018, 10:37 AM

## 2018-03-09 ENCOUNTER — Other Ambulatory Visit: Payer: Self-pay

## 2018-03-09 ENCOUNTER — Encounter (HOSPITAL_COMMUNITY): Payer: Self-pay | Admitting: Emergency Medicine

## 2018-03-09 ENCOUNTER — Emergency Department (HOSPITAL_COMMUNITY)
Admission: EM | Admit: 2018-03-09 | Discharge: 2018-03-10 | Disposition: A | Discharge status: Skilled Nursing Facility | Payer: Medicare (Managed Care) | Attending: Emergency Medicine | Admitting: Emergency Medicine

## 2018-03-09 DIAGNOSIS — R45851 Suicidal ideations: Secondary | ICD-10-CM

## 2018-03-09 DIAGNOSIS — F0280 Dementia in other diseases classified elsewhere without behavioral disturbance: Secondary | ICD-10-CM | POA: Diagnosis not present

## 2018-03-09 DIAGNOSIS — F329 Major depressive disorder, single episode, unspecified: Secondary | ICD-10-CM | POA: Diagnosis present

## 2018-03-09 DIAGNOSIS — N3000 Acute cystitis without hematuria: Secondary | ICD-10-CM

## 2018-03-09 DIAGNOSIS — E039 Hypothyroidism, unspecified: Secondary | ICD-10-CM | POA: Insufficient documentation

## 2018-03-09 DIAGNOSIS — J45909 Unspecified asthma, uncomplicated: Secondary | ICD-10-CM | POA: Insufficient documentation

## 2018-03-09 DIAGNOSIS — G20 Parkinson's disease: Secondary | ICD-10-CM | POA: Insufficient documentation

## 2018-03-09 DIAGNOSIS — I129 Hypertensive chronic kidney disease with stage 1 through stage 4 chronic kidney disease, or unspecified chronic kidney disease: Secondary | ICD-10-CM | POA: Insufficient documentation

## 2018-03-09 DIAGNOSIS — R4182 Altered mental status, unspecified: Secondary | ICD-10-CM | POA: Diagnosis present

## 2018-03-09 DIAGNOSIS — R410 Disorientation, unspecified: Secondary | ICD-10-CM | POA: Diagnosis not present

## 2018-03-09 DIAGNOSIS — N390 Urinary tract infection, site not specified: Secondary | ICD-10-CM | POA: Diagnosis present

## 2018-03-09 DIAGNOSIS — N183 Chronic kidney disease, stage 3 (moderate): Secondary | ICD-10-CM | POA: Diagnosis not present

## 2018-03-09 DIAGNOSIS — Z79899 Other long term (current) drug therapy: Secondary | ICD-10-CM | POA: Diagnosis not present

## 2018-03-09 LAB — COMPREHENSIVE METABOLIC PANEL
ALT: 5 U/L (ref 0–44)
AST: 24 U/L (ref 15–41)
Albumin: 4 g/dL (ref 3.5–5.0)
Alkaline Phosphatase: 71 U/L (ref 38–126)
Anion gap: 9 (ref 5–15)
BUN: 34 mg/dL — ABNORMAL HIGH (ref 8–23)
CALCIUM: 9.5 mg/dL (ref 8.9–10.3)
CO2: 22 mmol/L (ref 22–32)
CREATININE: 1.29 mg/dL — AB (ref 0.44–1.00)
Chloride: 112 mmol/L — ABNORMAL HIGH (ref 98–111)
GFR calc Af Amer: 43 mL/min — ABNORMAL LOW (ref >60)
GFR, EST NON AFRICAN AMERICAN: 37 mL/min — AB (ref >60)
Glucose, Bld: 102 mg/dL — ABNORMAL HIGH (ref 70–99)
Potassium: 4.3 mmol/L (ref 3.5–5.1)
Sodium: 143 mmol/L (ref 135–145)
Total Bilirubin: 0.5 mg/dL (ref 0.3–1.2)
Total Protein: 6.8 g/dL (ref 6.5–8.1)

## 2018-03-09 LAB — CBC
HCT: 36 % (ref 36.0–46.0)
HEMOGLOBIN: 11.2 g/dL — AB (ref 12.0–15.0)
MCH: 31.6 pg (ref 26.0–34.0)
MCHC: 31.1 g/dL (ref 30.0–36.0)
MCV: 101.7 fL — ABNORMAL HIGH (ref 80.0–100.0)
Platelets: 145 10*3/uL — ABNORMAL LOW (ref 150–400)
RBC: 3.54 MIL/uL — ABNORMAL LOW (ref 3.87–5.11)
RDW: 13.6 % (ref 11.5–15.5)
WBC: 8.9 10*3/uL (ref 4.0–10.5)
nRBC: 0 % (ref 0.0–0.2)

## 2018-03-09 LAB — ACETAMINOPHEN LEVEL: Acetaminophen (Tylenol), Serum: <10 ug/mL — ABNORMAL LOW (ref 10–30)

## 2018-03-09 LAB — SALICYLATE LEVEL: Salicylate Lvl: <7 mg/dL (ref 2.8–30.0)

## 2018-03-09 LAB — ETHANOL: Alcohol, Ethyl (B): <10 mg/dL (ref <10)

## 2018-03-09 NOTE — ED Notes (Signed)
TTS screening via telemonitor initiated.

## 2018-03-09 NOTE — ED Notes (Signed)
Answered pt's daughter's questions re: timeline for MD assessment.

## 2018-03-09 NOTE — ED Notes (Signed)
Bed: WA26 Expected date:  Expected time:  Means of arrival:  Comments: [facility] 

## 2018-03-09 NOTE — ED Notes (Addendum)
Pt attempting to get up to ambulate in room.

## 2018-03-09 NOTE — ED Notes (Signed)
Pt's daughter has left for the night. States she will be back in the morning.

## 2018-03-09 NOTE — ED Provider Notes (Signed)
[facility]-EMERGENCY DEPT Provider Note   CSN: [id number] Arrival date & time: 03/09/18  1826     History   Chief Complaint Chief Complaint  Patient presents with  . Medical Clearance    HPI Deanna Page is a 85 y.o. female.  The history is provided by the patient, medical records and a relative. The history is limited by the condition of the patient.  Mental Health Problem  Presenting symptoms: suicidal thoughts and suicidal threats   Presenting symptoms: no agitation, no hallucinations, no homicidal ideas and no suicide attempt   Patient accompanied by:  Family member Degree of incapacity (severity):  Severe Onset quality:  Gradual Timing:  Constant Progression:  Unchanged Chronicity:  Chronic Context: not noncompliant   Treatment compliance:  Untreated Associated symptoms: no abdominal pain, no chest pain, no fatigue and no headaches     Past Medical History:  Diagnosis Date  . Allergic rhinitis   . Anxiety   . Asthma   . Bowel obstruction (HCC)   . CKD (chronic kidney disease), stage III (HCC)   . Dementia (HCC)   . Diverticulosis   . Esophageal dysmotility   . Esophageal stricture 2005  . GERD (gastroesophageal reflux disease)   . Hepatitis A   . Hiatal hernia   . Hypertension   . Hypothyroidism   . Internal hemorrhoids   . Irritable bowel syndrome   . Ischemic colitis, enteritis, or enterocolitis (HCC) 2010  . Major depression    with paranoia and insomnia  . OSA (obstructive sleep apnea)   . Parkinson disease (HCC)   . Pneumonia   . TIA (transient ischemic attack)   . Urinary incontinence   . Vitamin D deficiency     Patient Active Problem List   Diagnosis Date Noted  . Fever in adult   . Chest pain in adult 02/11/2018  . Suicidal ideation   . Altered mental status 12/30/2016  . UTI (urinary tract infection) 12/30/2016  . Dementia (HCC) 12/30/2016  . Parkinson's disease (HCC) 03/01/2013  . GASTRITIS 02/22/2010  .  ABDOMINAL PAIN-EPIGASTRIC 02/22/2010  . DIVERTICULOSIS-COLON 12/19/2008  . OTHER DYSPHAGIA 10/07/2007  . TIA 01/21/2007  . DIARRHEA 01/21/2007  . GAIT DISTURBANCE 01/01/2007  . GASTROESOPHAGEAL REFLUX DISEASE 12/20/2006  . HYSTERECTOMY, HX OF 12/20/2006  . APPENDECTOMY, HX OF 12/20/2006  . SYNCOPE 10/01/2006  . SYMPTOM, MEMORY LOSS 09/16/2006  . BRONCHITIS, ACUTE 06/30/2006  . DEPRESSION 06/02/2006  . HYPERTENSION 06/02/2006  . ALLERGIC RHINITIS 06/02/2006  . ASTHMA 06/02/2006  . PROLAPSE, VAGINAL WALL, RECTOCELE 06/02/2006  . DIZZINESS OR VERTIGO 06/02/2006  . URINARY INCONTINENCE 06/02/2006  . PNEUMONIA, HX OF 06/02/2006  . BLADDER REPAIR, HX OF 06/02/2006  . INSOMNIA, HX OF 06/02/2006    Past Surgical History:  Procedure Laterality Date  . ABDOMINAL HYSTERECTOMY  1969  . APPENDECTOMY  1969  . BLADDER SURGERY    . BREAST LUMPECTOMY  1995  . CATARACT EXTRACTION    . CHOLECYSTECTOMY  2000  . CORNEAL TRANSPLANT Left   . INTRAOCULAR PROSTHESES INSERTION     Left eye at [facility]  . TUBAL LIGATION  1968     OB History   No obstetric history on file.      Home Medications    Prior to Admission medications   Medication Sig Start Date End Date Taking? Authorizing Provider  acetaminophen (TYLENOL) 325 MG tablet Take 650 mg by mouth 2 (two) times daily as needed for moderate pain.  [provider]  Alum & Mag Hydroxide-Simeth (GI COCKTAIL) SUSP suspension Take 30 mLs by mouth 2 (two) times daily as needed for indigestion (Abdominal pain). Shake well. 02/12/18   Theotis BarrioLee, Joshua K, MD  atenolol (TENORMIN) 25 MG tablet Take 25 mg by mouth 2 (two) times daily.     [provider]  carbidopa-levodopa (SINEMET IR) 25-100 MG tablet Take 1.5 tablets by mouth 4 (four) times daily. 04/15/16   Huston FoleyAthar, Saima, MD  citalopram (CELEXA) 20 MG tablet Take 1 tablet (20 mg total) by mouth at bedtime. 10/09/15   Huston FoleyAthar, Saima, MD  clonazePAM (KLONOPIN) 0.5 MG tablet Take 0.25 mg by  mouth 3 (three) times daily.     [provider]  fluorometholone (FML) 0.1 % ophthalmic suspension Place 1 drop into the left eye daily.    [provider]  levothyroxine (SYNTHROID, LEVOTHROID) 50 MCG tablet Take 50 mcg by mouth daily before breakfast.    [provider]  memantine (NAMENDA) 10 MG tablet Take 10 mg by mouth 2 (two) times daily.     [provider]  Mouthwashes (BIOTENE DRY MOUTH MT) Use as directed in the mouth or throat 2 (two) times daily.    [provider]  Nutritional Supplements (ENSURE ORIGINAL) LIQD Take 8 oz by mouth 2 (two) times daily.     [provider]  pantoprazole (PROTONIX) 20 MG tablet Take 2 tablets (40 mg total) by mouth daily. 02/12/18   Theotis BarrioLee, Joshua K, MD  polyvinyl alcohol (LIQUIFILM TEARS) 1.4 % ophthalmic solution Place 1 drop into both eyes 4 (four) times daily as needed for dry eyes.     [provider]  sennosides-docusate sodium (SENOKOT-S) 8.6-50 MG tablet Take 2 tablets by mouth at bedtime.    [provider]  sodium chloride (MURO 128) 5 % ophthalmic ointment Place 1 application into the right eye at bedtime. For hx of Corneal transplant    [provider]  sodium fluoride (PREVIDENT 5000 PLUS) 1.1 % CREA dental cream Place 1 application onto teeth every evening.    [provider]  triazolam (HALCION) 0.25 MG tablet Take 0.125 mg by mouth at bedtime.     [provider]  trimethoprim (TRIMPEX) 100 MG tablet Take 100 mg by mouth daily.    [provider]  Vitamin D, Cholecalciferol, 1000 units CAPS Take 1 capsule by mouth daily.    [provider]    Family History Family History  Problem Relation Age of Onset  . Stroke Mother   . Parkinson's disease Mother   . Lung cancer Father   . ALS Sister   . Liver disease Sister   . Parkinson's disease Maternal Grandmother   . Parkinson's disease Maternal Aunt   . Emphysema Sister   .  Emphysema Brother   . Colon cancer Neg Hx   . Colon polyps Neg Hx   . Diabetes Neg Hx   . Gallbladder disease Neg Hx   . Esophageal cancer Neg Hx     Social History Social History   Tobacco Use  . Smoking status: Never Smoker  . Smokeless tobacco: Never Used  Substance Use Topics  . Alcohol use: No    Alcohol/week: 0.0 standard drinks  . Drug use: No     Allergies   Erythromycin; Penicillins; Aricept [donepezil hcl]; Clindamycin/lincomycin; Doxycycline; and Pneumococcal vaccines   Review of Systems Review of Systems  Unable to perform ROS: Dementia  Constitutional: Negative for chills, fatigue and  fever.  HENT: Negative for congestion.   Respiratory: Negative for cough, chest tightness, shortness of breath, wheezing and stridor.   Cardiovascular: Negative for chest pain and palpitations.  Gastrointestinal: Negative for abdominal pain, constipation, diarrhea, nausea and vomiting.  Genitourinary: Negative for dysuria.  Musculoskeletal: Negative for back pain, neck pain and neck stiffness.  Skin: Negative for rash and wound.  Neurological: Negative for light-headedness and headaches.  Psychiatric/Behavioral: Positive for suicidal ideas. Negative for agitation, hallucinations and homicidal ideas.  All other systems reviewed and are negative.    Physical Exam Updated Vital Signs BP 140/73 (BP Location: Left Arm)   Pulse 63   Temp 98 F (36.7 C) (Oral)   Resp 20   SpO2 96%   Physical Exam Vitals signs and nursing note reviewed.  Constitutional:      General: She is not in acute distress.    Appearance: She is well-developed. She is not ill-appearing, toxic-appearing or diaphoretic.  HENT:     Head: Normocephalic and atraumatic.     Right Ear: External ear normal.     Left Ear: External ear normal.     Nose: Nose normal. No congestion or rhinorrhea.     Mouth/Throat:     Pharynx: No oropharyngeal exudate.  Eyes:     Conjunctiva/sclera: Conjunctivae normal.      Pupils: Pupils are equal, round, and reactive to light.  Neck:     Musculoskeletal: Normal range of motion and neck supple. No neck rigidity or muscular tenderness.  Cardiovascular:     Rate and Rhythm: Normal rate.     Pulses: Normal pulses.     Heart sounds: Murmur present.  Pulmonary:     Effort: No respiratory distress.     Breath sounds: No stridor. No wheezing, rhonchi or rales.  Chest:     Chest wall: No tenderness.  Abdominal:     General: There is no distension.     Tenderness: There is no abdominal tenderness. There is no rebound.  Musculoskeletal:        General: No swelling.     Right lower leg: No edema.     Left lower leg: No edema.  Skin:    General: Skin is warm.     Capillary Refill: Capillary refill takes less than 2 seconds.     Findings: No erythema or rash.  Neurological:     General: No focal deficit present.     Mental Status: She is alert and oriented to person, place, and time.     Motor: No weakness or abnormal muscle tone.     Coordination: Coordination normal.     Deep Tendon Reflexes: Reflexes are normal and symmetric.  Psychiatric:        Thought Content: Thought content includes suicidal ideation. Thought content does not include homicidal ideation. Thought content includes suicidal plan. Thought content does not include homicidal plan.      ED Treatments / Results  Labs (all labs ordered are listed, but only abnormal results are displayed) Labs Reviewed  COMPREHENSIVE METABOLIC PANEL - Abnormal; Notable for the following components:      Result Value   Chloride 112 (*)    Glucose, Bld 102 (*)    BUN 34 (*)    Creatinine, Ser 1.29 (*)    GFR calc non Af Amer 37 (*)    GFR calc Af Amer 43 (*)    All other components within normal limits  ACETAMINOPHEN LEVEL - Abnormal; Notable for the following components:  Acetaminophen (Tylenol), Serum <10 (*)    All other components within normal limits  CBC - Abnormal; Notable for the following  components:   RBC 3.54 (*)    Hemoglobin 11.2 (*)    MCV 101.7 (*)    Platelets 145 (*)    All other components within normal limits  URINE CULTURE  ETHANOL  SALICYLATE LEVEL  RAPID URINE DRUG SCREEN, HOSP PERFORMED  URINALYSIS, ROUTINE W REFLEX MICROSCOPIC    EKG None  Radiology No results found.  Procedures Procedures (including critical care time)  Medications Ordered in ED Medications  acetaminophen (TYLENOL) tablet 650 mg (has no administration in time range)  alum & mag hydroxide-simeth (MAALOX/MYLANTA) 200-200-20 MG/5ML suspension 15 mL (has no administration in time range)  atenolol (TENORMIN) tablet 25 mg (has no administration in time range)  carbidopa-levodopa (SINEMET IR) 25-100 MG per tablet immediate release 1-1.5 tablet (has no administration in time range)  citalopram (CELEXA) tablet 20 mg (has no administration in time range)  clonazePAM (KLONOPIN) tablet 0.25 mg (has no administration in time range)  fluorometholone (FML) 0.1 % ophthalmic suspension 1 drop (has no administration in time range)  levothyroxine (SYNTHROID, LEVOTHROID) tablet 50 mcg (has no administration in time range)  memantine (NAMENDA) tablet 10 mg (has no administration in time range)  ENSURE ORIGINAL LIQD 8 oz (has no administration in time range)  pantoprazole (PROTONIX) EC tablet 40 mg (has no administration in time range)  perphenazine (TRILAFON) tablet 4 mg (has no administration in time range)  polyvinyl alcohol (LIQUIFILM TEARS) 1.4 % ophthalmic solution 1 drop (has no administration in time range)  sennosides-docusate sodium (SENOKOT-S) 8.6-50 MG tablet 2 tablet (has no administration in time range)  sodium chloride (MURO 128) 5 % ophthalmic ointment 1 application (has no administration in time range)  sodium fluoride (PREVIDENT 5000 PLUS) 1.1 % dental cream 1 application (has no administration in time range)  triazolam (HALCION) tablet 0.125 mg (has no administration in time range)   trimethoprim (TRIMPEX) tablet 100 mg (has no administration in time range)  Vitamin D (Cholecalciferol) CAPS 1 capsule (has no administration in time range)     Initial Impression / Assessment and Plan / ED Course  I have reviewed the triage vital signs and the nursing notes.  Pertinent labs & imaging results that were available during my care of the patient were reviewed by me and considered in my medical decision making (see chart for details).     DACOTA DEVALL is a 85 y.o. female with a past medical history significant for asthma, hypertension, major depression, TIA, Parkinson's disease, dementia, hypothyroidism, and chronic kidney disease who presents with suicidal ideation.  Patient is accompanied by her daughter who reports that her mother was sent to the emergency department tonight from [facility] skilled nursing facility after she was telling people she was going to kill herself.  Patient reports she is still having suicidal ideation with a plan to "fall onto the floor".  Patient says that she is never tried herself before.  Patient has dementia and thinks that her 54 year old husband is cheating on her.  The daughter says that her mother has made suicidal comments "all her life" but due to the worsening suicidal threats with a now plan of falling, she was sent here for evaluation.  The patient denies any physical complaints including no fevers, chills, ingestion, cough, shortness of breath, abdominal pain, chest pain, urinary symptoms or GI symptoms.  Patient is resting comfortably.  On exam,  patient has resting tremor.  Patient has normal sensation in all extremities.  Lungs clear and chest is nontender.  Abdomen is nontender.  Patient has pupils are symmetric and reactive bilaterally, normal extraocular movements.  Speech is clear.  I have low suspicion for a acute medical problem with this patient however given her age and acute worsening, the daughter requested we evaluate for  urinary tract infection as this does cause mental changes for the patient in the past.  Patient history laboratory testing and urinalysis.  Anticipate speaking with TTS for management recommendations.          12:18 AM TTS documentation shows that they want to have her reassessed in the morning.  Home medicines to be ordered.  Patient is medically cleared for psychiatric evaluation and management.  Home medicines have been ordered.   Final Clinical Impressions(s) / ED Diagnoses   Final diagnoses:  Suicidal ideation    Clinical Impression: 1. Suicidal ideation     Disposition: Psychiatry to reassess in the morning.  This note was prepared with assistance of Conservation officer, historic buildingsDragon voice recognition software. Occasional wrong-word or sound-a-like substitutions may have occurred due to the inherent limitations of voice recognition software.       Lenoir Facchini, Canary Brimhristopher J, MD 03/10/18 [phone]

## 2018-03-09 NOTE — ED Notes (Signed)
Pharmacy Tech at bedside to review [facility] from [facility].

## 2018-03-09 NOTE — ED Notes (Signed)
Pt's daughter states that pt can do a two-person assist to the BR or bed-side commode. She cannot ambulate without assist or toilet without assist.

## 2018-03-09 NOTE — ED Notes (Signed)
Phlebotomy at bedside. Waiting for patient to finish on bedpan.

## 2018-03-09 NOTE — ED Notes (Signed)
Phlebotomy drawing labs at this time 

## 2018-03-09 NOTE — ED Triage Notes (Signed)
Brought by EMS from [facility] for SI. H/o dementia. This has happened before, but her doctor and her husband want her to be evaluated. She has a plan to throw herself onto the ground to kill herself. Calm and cooperative with EMS. Alert to self. Wheel chair bound because of Parkinson's. Can stand with assist. Basically total care.

## 2018-03-09 NOTE — ED Notes (Signed)
ED MD Dr. Rush Landmark at bedside.

## 2018-03-09 NOTE — BH Assessment (Addendum)
Tele Assessment Note   Patient Name: Deanna Page MRN: [id number] Referring Physician: Dr. Rush Landmarkegeler, MD Location of Patient: [facility] Location of Provider: Behavioral Health TTS Department  Deanna Page Deanna Page is an 85 y.o. female who was brought to [facility] due to threatening to throw herself on the floor in an attempt to kill herself. Pt had varying levels of communication with EMTs and hospital staff; she would only answer certain questions presented by clinician. The information she did provide, however, was specific, though it cannot be verified as accurate.  Pt was unwilling to answer questions in regards to MSE, her history, her family history, etc, though she was able to verify that she was not feeling well and that she hurt "all over." She was also able to identify who came to visit her at the hospital, how many children she has, how many grandchildren she has, how old they are, and where everyone lives. Pt was cooperative with the information she provided and was overall pleasant.  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 Allergic rhinitis   . Anxiety   . Asthma   . Bowel obstruction (HCC)   . CKD (chronic kidney disease), stage III (HCC)   . Dementia (HCC)   . Diverticulosis   . Esophageal dysmotility   . Esophageal stricture 2005  . GERD (gastroesophageal reflux disease)   . Hepatitis A   . Hiatal hernia   . Hypertension   . Hypothyroidism   . Internal hemorrhoids   . Irritable bowel syndrome   . Ischemic colitis, enteritis, or enterocolitis (HCC) 2010  . Major depression    with paranoia and insomnia  . OSA (obstructive sleep apnea)   . Parkinson disease (HCC)   . Pneumonia   . TIA (transient ischemic attack)   . Urinary incontinence   . Vitamin D deficiency     Past Surgical History:  Procedure Laterality  Date  . ABDOMINAL HYSTERECTOMY  1969  . APPENDECTOMY  1969  . BLADDER SURGERY    . BREAST LUMPECTOMY  1995  . CATARACT EXTRACTION    . CHOLECYSTECTOMY  2000  . CORNEAL TRANSPLANT Left   . INTRAOCULAR PROSTHESES INSERTION     [facility]  . TUBAL LIGATION  1968    Family History:  Family History  Problem Relation Age of Onset  . Stroke Mother   . Parkinson's disease Mother   . Lung cancer Father   . ALS Sister   . Liver disease Sister   . Parkinson's disease Maternal Grandmother   . Parkinson's disease Maternal Aunt   . Emphysema Sister   . Emphysema Brother   . Colon cancer Neg Hx   . Colon polyps Neg Hx   . Diabetes Neg Hx   . Gallbladder disease Neg Hx   . Esophageal cancer Neg Hx     Social History:  reports that she has never smoked. She has never used smokeless tobacco. She reports that she does not drink alcohol or use drugs.  Additional Social History:  Alcohol / Drug Use Pain Medications: Please see MAR Prescriptions: Please see MAR Over the Counter: Please see MAR History of alcohol / drug use?: No history of alcohol / drug abuse Longest period of sobriety (when/how long): N/A  CIWA: CIWA-Ar BP: 140/73 Pulse Rate: 63 COWS:    Allergies:  Allergies  Allergen Reactions  .  Erythromycin     Other reaction(s): Other (See Comments) Other Reaction: GI Upset Unknown reaction  . Penicillins Rash and Shortness Of Breath    Unknown reaction Has patient had a PCN reaction causing immediate rash, facial/tongue/throat swelling, SOB or lightheadedness with hypotension: Unknown Has patient had a PCN reaction causing severe rash involving mucus membranes or skin necrosis: Unknown Has patient had a PCN reaction that required hospitalization: Unknown Has patient had a PCN reaction occurring within the last 10 years: Unknown If all of the above answers are "NO", then may proceed with Cephalosporin use.   Deanna Page [Donepezil Hcl]     Unknown reaction  .  Clindamycin/Lincomycin Itching  . Doxycycline     Unknown reaction  . Pneumococcal Vaccines     Unknown reaction    Home Medications: (Not in a hospital admission)   OB/GYN Status:  No LMP recorded. Patient has had a hysterectomy.  General Assessment Data Assessment unable to be completed: Yes Reason for not completing assessment: Multiple assessments ordered simultaneously Location of Assessment: [facility] ED TTS Assessment: In system Is this a Tele or Face-to-Face Assessment?: Tele Assessment Is this an Initial Assessment or a Re-assessment for this encounter?: Initial Assessment Patient Accompanied by:: N/A Language Other than English: No Living Arrangements: In Assisted Living/Nursing Home (Comment: Name of Nursing Home([facility]) What gender do you identify as?: Female Marital status: Other (comment)(UTA) Maiden name: UTA Pregnancy Status: No Living Arrangements: Other (Comment)(Nursing Home) Can pt return to current living arrangement?: Yes Admission Status: Voluntary Is patient capable of signing voluntary admission?: Yes Referral Source: MD Insurance type: Medicaid     Crisis Care Plan Living Arrangements: Other (Comment)(Nursing Home) Legal Guardian: (N/A) Name of Psychiatrist: UTA Name of Therapist: UTA  Education Status Is patient currently in school?: No Is the patient employed, unemployed or receiving disability?: Receiving disability income  Risk to self with the past 6 months Suicidal Ideation: (UTA; expressed wanting to throw self on floor & die earlier) Has patient been a risk to self within the past 6 months prior to admission? : No Suicidal Intent: No Has patient had any suicidal intent within the past 6 months prior to admission? : No Is patient at risk for suicide?: No Suicidal Plan?: Yes-Currently Present Has patient had any suicidal plan within the past 6 months prior to admission? : No Specify Current Suicidal Plan: Pt's plan is to  throw herself on the floor Access to Means: Yes Specify Access to Suicidal Means: Pt could throw herself on the floor from her wheelchair or from her bed What has been your use of drugs/alcohol within the last 12 months?: N/A Previous Attempts/Gestures: (UTA) How many times?: (UTA) Other Self Harm Risks: None noted Triggers for Past Attempts: Unknown Intentional Self Injurious Behavior: (UTA) Family Suicide History: Unable to assess Recent stressful life event(s): Other (Comment)(None noted) Persecutory voices/beliefs?: Rich Reining) Depression: (UTA) Depression Symptoms: Feeling worthless/self pity Substance abuse history and/or treatment for substance abuse?: (UTA) Suicide prevention information given to non-admitted patients: Not applicable  Risk to Others within the past 6 months Homicidal Ideation: No Does patient have any lifetime risk of violence toward others beyond the six months prior to admission? : No Thoughts of Harm to Others: No Current Homicidal Intent: No Current Homicidal Plan: No Access to Homicidal Means: No Identified Victim: None noted History of harm to others?: (UTA) Assessment of Violence: On admission Violent Behavior Description: None noted Does patient have access to weapons?: (UTA) Criminal Charges Pending?:  No Does patient have a court date: No Is patient on probation?: No  Psychosis Hallucinations: (UTA) Delusions: (UTA)  Mental Status Report Appearance/Hygiene: In hospital gown Eye Contact: Good Motor Activity: Other (Comment)(Pt was lying in her hospital bed, slowly slid down at times) Speech: Logical/coherent, Elective mutism Level of Consciousness: Alert Mood: Anxious Affect: Appropriate to circumstance Anxiety Level: Moderate Thought Processes: Coherent, Relevant Judgement: Partial Orientation: Unable to assess Obsessive Compulsive Thoughts/Behaviors: Minimal  Cognitive Functioning Concentration: Decreased Memory: Recent Intact, Remote  Intact Is patient IDD: No Insight: Unable to Assess Impulse Control: Unable to Assess Appetite: (UTA) Have you had any weight changes? : (UTA) Sleep: Unable to Assess Total Hours of Sleep: (UTA) Vegetative Symptoms: Unable to Assess(UTA)  ADLScreening [facility] Assessment Services) Patient's cognitive ability adequate to safely complete daily activities?: No Patient able to express need for assistance with ADLs?: Yes Independently performs ADLs?: No  Prior Inpatient Therapy Prior Inpatient Therapy: (UTA)  Prior Outpatient Therapy Prior Outpatient Therapy: (UTA)  ADL Screening (condition at time of admission) Patient's cognitive ability adequate to safely complete daily activities?: No Is the patient deaf or have difficulty hearing?: Yes Does the patient have difficulty seeing, even when wearing glasses/contacts?: No Does the patient have difficulty concentrating, remembering, or making decisions?: No Patient able to express need for assistance with ADLs?: Yes Does the patient have difficulty dressing or bathing?: Yes Independently performs ADLs?: No Does the patient have difficulty walking or climbing stairs?: Yes Weakness of Legs: Both Weakness of Arms/Hands: Both     Therapy Consults (therapy consults require a physician order) PT Evaluation Needed: No OT Evalulation Needed: No SLP Evaluation Needed: No Abuse/Neglect Assessment (Assessment to be complete while patient is alone) Abuse/Neglect Assessment Can Be Completed: Unable to assess, patient is non-responsive or altered mental status Values / Beliefs Cultural Requests During Hospitalization: None Spiritual Requests During Hospitalization: None Consults Spiritual Care Consult Needed: No Social Work Consult Needed: No Merchant navy officerAdvance Directives (For Healthcare) Does Patient Have a Medical Advance Directive?: No Would patient like information on creating a medical advance directive?: No - Patient declined       Disposition:  Nira ConnJason Berry NP reviewed pt's chart and information and determined pt should be observed overnight for safety and stability due to expressing SI and having a plan but the inability to complete the assessment; she should be re-assessed in the morning.  Disposition Initial Assessment Completed for this Encounter: Yes Patient referred to: Other (Comment)(Pt will be observed overnight and re-assessed in the morning)  This service was provided via telemedicine using a 2-way, interactive audio and video technology.  Names of all persons participating in this telemedicine service and their role in this encounter. Name: Deanna Page Role: Patient  Name: Deanna Page Role: Clinician    Ralph DowdySamantha L Kaylena Pacifico 03/09/2018 11:09 PM

## 2018-03-09 NOTE — ED Notes (Signed)
Pt put on bed pan, but not able to use the bathroom. Her sacral area was clean without any redness or skin tears.

## 2018-03-10 DIAGNOSIS — N3000 Acute cystitis without hematuria: Secondary | ICD-10-CM

## 2018-03-10 DIAGNOSIS — R45851 Suicidal ideations: Secondary | ICD-10-CM

## 2018-03-10 DIAGNOSIS — R410 Disorientation, unspecified: Secondary | ICD-10-CM

## 2018-03-10 LAB — URINALYSIS, ROUTINE W REFLEX MICROSCOPIC
Bacteria, UA: NONE SEEN
Bilirubin Urine: NEGATIVE
Glucose, UA: NEGATIVE mg/dL
Hgb urine dipstick: NEGATIVE
Ketones, ur: 5 mg/dL — AB
NITRITE: NEGATIVE
PH: 5 (ref 5.0–8.0)
Protein, ur: NEGATIVE mg/dL
Specific Gravity, Urine: 1.017 (ref 1.005–1.030)
WBC, UA: >50 WBC/hpf — ABNORMAL HIGH (ref 0–5)

## 2018-03-10 LAB — RAPID URINE DRUG SCREEN, HOSP PERFORMED
Amphetamines: NOT DETECTED
Barbiturates: NOT DETECTED
Benzodiazepines: POSITIVE — AB
Cocaine: NOT DETECTED
Opiates: NOT DETECTED
Tetrahydrocannabinol: NOT DETECTED

## 2018-03-10 MED ORDER — TRIMETHOPRIM 100 MG PO TABS
100.0000 mg | ORAL_TABLET | Freq: Every day | ORAL | Status: DC
  Administered 2018-03-10: 100 mg via ORAL
  Filled 2018-03-10: qty 1

## 2018-03-10 MED ORDER — VITAMIN D 25 MCG (1000 UNIT) PO TABS
1000.0000 [IU] | ORAL_TABLET | Freq: Every day | ORAL | Status: DC
  Administered 2018-03-10: 1000 [IU] via ORAL
  Filled 2018-03-10: qty 1

## 2018-03-10 MED ORDER — SENNA-DOCUSATE SODIUM 8.6-50 MG PO TABS: 2.0000 | ORAL_TABLET | Freq: Every day | ORAL | Status: DC

## 2018-03-10 MED ORDER — PHENAZOPYRIDINE HCL 200 MG PO TABS
200.0000 mg | ORAL_TABLET | Freq: Three times a day (TID) | ORAL | Status: DC
  Administered 2018-03-10: 200 mg via ORAL
  Filled 2018-03-10 (×2): qty 1

## 2018-03-10 MED ORDER — TRIAZOLAM 0.125 MG PO TABS: 0.1250 mg | ORAL_TABLET | Freq: Every day | ORAL | Status: DC

## 2018-03-10 MED ORDER — VITAMIN D (CHOLECALCIFEROL) 25 MCG (1000 UT) PO CAPS: 1.0000 | ORAL_CAPSULE | Freq: Every day | ORAL | Status: DC

## 2018-03-10 MED ORDER — LEVOTHYROXINE SODIUM 50 MCG PO TABS
50.0000 ug | ORAL_TABLET | Freq: Every day | ORAL | Status: DC
  Administered 2018-03-10: 50 ug via ORAL
  Filled 2018-03-10: qty 1

## 2018-03-10 MED ORDER — PERPHENAZINE 4 MG PO TABS
4.0000 mg | ORAL_TABLET | Freq: Two times a day (BID) | ORAL | Status: DC
  Administered 2018-03-10: 4 mg via ORAL
  Filled 2018-03-10: qty 1

## 2018-03-10 MED ORDER — ACETAMINOPHEN 325 MG PO TABS: 650.0000 mg | ORAL_TABLET | Freq: Two times a day (BID) | ORAL | Status: DC | PRN

## 2018-03-10 MED ORDER — SODIUM CHLORIDE (HYPERTONIC) 5 % OP OINT
1.0000 "application " | TOPICAL_OINTMENT | Freq: Every day | OPHTHALMIC | Status: DC
  Filled 2018-03-10: qty 3.5

## 2018-03-10 MED ORDER — ENSURE ENLIVE PO LIQD
237.0000 mL | Freq: Two times a day (BID) | ORAL | Status: DC
  Administered 2018-03-10 (×2): 237 mL via ORAL
  Filled 2018-03-10 (×2): qty 237

## 2018-03-10 MED ORDER — LEVOFLOXACIN 250 MG PO TABS: 250.0000 mg | ORAL_TABLET | Freq: Every day | ORAL | 0 refills | Status: DC

## 2018-03-10 MED ORDER — CARBIDOPA-LEVODOPA 25-100 MG PO TABS: 1.0000 | ORAL_TABLET | Freq: Every day | ORAL | Status: DC

## 2018-03-10 MED ORDER — ATENOLOL 25 MG PO TABS
25.0000 mg | ORAL_TABLET | Freq: Two times a day (BID) | ORAL | Status: DC
  Administered 2018-03-10: 25 mg via ORAL
  Filled 2018-03-10: qty 1

## 2018-03-10 MED ORDER — CLONAZEPAM 0.5 MG PO TABS
0.2500 mg | ORAL_TABLET | Freq: Three times a day (TID) | ORAL | Status: DC
  Administered 2018-03-10 (×2): 0.25 mg via ORAL
  Filled 2018-03-10 (×2): qty 1

## 2018-03-10 MED ORDER — ALUM & MAG HYDROXIDE-SIMETH 200-200-20 MG/5ML PO SUSP: 15.0000 mL | Freq: Two times a day (BID) | ORAL | Status: DC | PRN

## 2018-03-10 MED ORDER — CARBIDOPA-LEVODOPA ER 25-100 MG PO TBCR
1.0000 | EXTENDED_RELEASE_TABLET | Freq: Every day | ORAL | Status: DC
  Administered 2018-03-10: 1 via ORAL
  Filled 2018-03-10: qty 1

## 2018-03-10 MED ORDER — PHENAZOPYRIDINE HCL 200 MG PO TABS: 200.0000 mg | ORAL_TABLET | Freq: Three times a day (TID) | ORAL | 0 refills | Status: DC

## 2018-03-10 MED ORDER — CITALOPRAM HYDROBROMIDE 10 MG PO TABS
20.0000 mg | ORAL_TABLET | Freq: Every day | ORAL | Status: DC
  Administered 2018-03-10: 20 mg via ORAL
  Filled 2018-03-10: qty 2

## 2018-03-10 MED ORDER — SENNOSIDES-DOCUSATE SODIUM 8.6-50 MG PO TABS: 1.0000 | ORAL_TABLET | Freq: Every day | ORAL | Status: DC

## 2018-03-10 MED ORDER — FLUOROMETHOLONE 0.1 % OP SUSP
1.0000 [drp] | Freq: Every day | OPHTHALMIC | Status: DC
  Administered 2018-03-10: 1 [drp] via OPHTHALMIC
  Filled 2018-03-10: qty 5

## 2018-03-10 MED ORDER — MEMANTINE HCL 10 MG PO TABS
10.0000 mg | ORAL_TABLET | Freq: Two times a day (BID) | ORAL | Status: DC
  Administered 2018-03-10: 10 mg via ORAL
  Filled 2018-03-10: qty 1

## 2018-03-10 MED ORDER — POLYVINYL ALCOHOL 1.4 % OP SOLN
1.0000 [drp] | Freq: Four times a day (QID) | OPHTHALMIC | Status: DC | PRN
  Filled 2018-03-10: qty 15

## 2018-03-10 MED ORDER — ENSURE ORIGINAL PO LIQD: 8.0000 [oz_av] | Freq: Two times a day (BID) | ORAL | Status: DC

## 2018-03-10 MED ORDER — PANTOPRAZOLE SODIUM 40 MG PO TBEC
40.0000 mg | DELAYED_RELEASE_TABLET | Freq: Every day | ORAL | Status: DC
  Administered 2018-03-10: 40 mg via ORAL
  Filled 2018-03-10: qty 1

## 2018-03-10 MED ORDER — SODIUM FLUORIDE 1.1 % DT CREA: 1.0000 "application " | TOPICAL_CREAM | Freq: Every evening | DENTAL | Status: DC

## 2018-03-10 MED ORDER — CARBIDOPA-LEVODOPA 25-100 MG PO TABS: 1.5000 | ORAL_TABLET | Freq: Three times a day (TID) | ORAL | Status: DC

## 2018-03-10 MED ORDER — CARBIDOPA-LEVODOPA 25-100 MG PO TABS: 1.0000 | ORAL_TABLET | Freq: Four times a day (QID) | ORAL | Status: DC

## 2018-03-10 MED ORDER — CARBIDOPA-LEVODOPA 25-100 MG PO TABS
1.5000 | ORAL_TABLET | Freq: Four times a day (QID) | ORAL | Status: DC
  Administered 2018-03-10 (×2): 1.5 via ORAL
  Filled 2018-03-10 (×3): qty 1.5

## 2018-03-10 MED ORDER — LEVOFLOXACIN 250 MG PO TABS
250.0000 mg | ORAL_TABLET | Freq: Every day | ORAL | Status: DC
  Administered 2018-03-10: 250 mg via ORAL
  Filled 2018-03-10: qty 1

## 2018-03-10 NOTE — BH Assessment (Signed)
[facility] Assessment Progress Note  Per Juanetta Beets, DO, this pt does not require psychiatric hospitalization at this time.  Pt is to be discharged from [facility] to return to her residential facility.  Archie Balboa, LCSW, agrees to facilitate this.  No behavioral health referrals are required on pt's discharge instructions.  Pt's nurse, Aram Beecham, has been notified.  Doylene Canning, MA Triage Specialist [phone]

## 2018-03-10 NOTE — ED Notes (Signed)
Reminded pt that urine sample was still needed for diagnostic work up.

## 2018-03-10 NOTE — Progress Notes (Addendum)
CSW aware patient has been medically and psychiatrically cleared for discharge. CSW aware patient is from [facility] and is agreeable to return. CSW spoke with Aram Beecham at [facility] who stated patient can return and took information from CSW. CSW aware from previous notes that [facility] transported patient back to the facility last time. CSW spoke with Dewayne Hatch, [facility] Social Worker, who stated that [facility] would transport patient back to facility. Ann to call back with specific time. CSW will continue to follow.  12:24pm- CSW received return call from [place] [facility] who stated [facility] would be by to pick up patient at 1:30pm. CSW has updated patient's RN. No further CSW needs at this time. Please reconsult if need arises.   Archie Balboa, LCSWA  Clinical Social Work Department  [employer]  [phone]

## 2018-03-10 NOTE — Consult Note (Addendum)
[facility]  03/10/2018 12:04 PM Deanna Page  MRN:  [id number] Principal Problem: Altered mental status Discharge Diagnoses: Principal Problem:   Suicidal ideation Active Problems:   Altered mental status   UTI (urinary tract infection)  HPI: Deanna Page is an 85 y.o. female who was [facility] due to threatening to throw herself on the floor in an attempt to kill herself. Pt had varying levels of communication with EMTs and hospital staff; she would only answer certain questions presented by clinician. The information she did provide, however, was specific, though it cannot be verified as accurate.  Pt was unwilling to answer questions in regards to MSE, her history, her family history, etc, though she was able to verify that she was not feeling well and that she hurt "all over." She was also able to identify who came to visit her at the hospital, how many children she has, how many grandchildren she has, how old they are, and where everyone lives. Pt was cooperative with the information she provided and was overall pleasant.  Subjective: 85 year old patient who is alert and oriented x3. She presents for evaluation due to suicidal ideation with a plan to throw herself on the ground. She currently resides in a skilled nursing home. She denies any history of mental illness with the exception of the dementia. She is compliant with her medications at this time. She denies any suicidal or homicidal ideations, and or auditory visual hallucinations. She was found to have a UTI on admission and treatment has been started with Levofloxacin 250mg  po daily x 7 days. At this time she does not meet criteria for psychiatric inpatient.   Total Time spent with patient: 30 minutes  Past Psychiatric History: Anxiety and Dementia  Past Medical History:  Past Medical History:  Diagnosis Date  . Allergic rhinitis   . Anxiety   . Asthma   . Bowel obstruction (HCC)   . CKD  (chronic kidney disease), stage III (HCC)   . Dementia (HCC)   . Diverticulosis   . Esophageal dysmotility   . Esophageal stricture 2005  . GERD (gastroesophageal reflux disease)   . Hepatitis A   . Hiatal hernia   . Hypertension   . Hypothyroidism   . Internal hemorrhoids   . Irritable bowel syndrome   . Ischemic colitis, enteritis, or enterocolitis (HCC) 2010  . Major depression    with paranoia and insomnia  . OSA (obstructive sleep apnea)   . Parkinson disease (HCC)   . Pneumonia   . TIA (transient ischemic attack)   . Urinary incontinence   . Vitamin D deficiency     Past Surgical History:  Procedure Laterality Date  . ABDOMINAL HYSTERECTOMY  1969  . APPENDECTOMY  1969  . BLADDER SURGERY    . BREAST LUMPECTOMY  1995  . CATARACT EXTRACTION    . CHOLECYSTECTOMY  2000  . CORNEAL TRANSPLANT Left   . INTRAOCULAR PROSTHESES INSERTION     [facility]  . TUBAL LIGATION  1968   Family History:  Family History  Problem Relation Age of Onset  . Stroke Mother   . Parkinson's disease Mother   . Lung cancer Father   . ALS Sister   . Liver disease Sister   . Parkinson's disease Maternal Grandmother   . Parkinson's disease Maternal Aunt   . Emphysema Sister   . Emphysema Brother   . Colon cancer Neg Hx   .  Colon polyps Neg Hx   . Diabetes Neg Hx   . Gallbladder disease Neg Hx   . Esophageal cancer Neg Hx    Family Psychiatric  History: As listed above.  Social History:  Social History   Substance and Sexual Activity  Alcohol Use No  . Alcohol/week: 0.0 standard drinks     Social History   Substance and Sexual Activity  Drug Use No    Social History   Socioeconomic History  . Marital status: Married    Spouse name: Molly Maduro  . Number of children: 3  . Years of education: 79  . Highest education level: Not on file  Occupational History  . Occupation: Retired    Associate Professor: RETIRED  Social Needs  . Financial resource strain: Not on file  . Food  insecurity:    Worry: Not on file    Inability: Not on file  . Transportation needs:    Medical: Not on file    Non-medical: Not on file  Tobacco Use  . Smoking status: Never Smoker  . Smokeless tobacco: Never Used  Substance and Sexual Activity  . Alcohol use: No    Alcohol/week: 0.0 standard drinks  . Drug use: No  . Sexual activity: Not on file  Lifestyle  . Physical activity:    Days per week: Not on file    Minutes per session: Not on file  . Stress: Not on file  Relationships  . Social connections:    Talks on phone: Not on file    Gets together: Not on file    Attends religious service: Not on file    Active member of club or organization: Not on file    Attends meetings of clubs or organizations: Not on file    Relationship status: Not on file  Other Topics Concern  . Not on file  Social History Narrative   Patient is married Molly Maduro) and lives with her husband.   Patient has three children.   Patient is retired.   Patient has an high school education.   Patient does drink caffeine beverages.   Patient is left handed.          Has this patient used any form of tobacco in the last 30 days? (Cigarettes, Smokeless Tobacco, Cigars, and/or Pipes) Prescription not provided because: patient does not smoke  Current Medications: Current Facility-Administered Medications  Medication Dose Route Frequency Provider Last Rate Last Dose  . acetaminophen (TYLENOL) tablet 650 mg  650 mg Oral BID PRN Tegeler, Canary Brim, MD      . alum & mag hydroxide-simeth (MAALOX/MYLANTA) 200-200-20 MG/5ML suspension 15 mL  15 mL Oral BID PRN Tegeler, Canary Brim, MD      . atenolol (TENORMIN) tablet 25 mg  25 mg Oral BID Tegeler, Canary Brim, MD   25 mg at 03/10/18 0915  . carbidopa-levodopa (SINEMET IR) 25-100 MG per tablet immediate release 1.5 tablet  1.5 tablet Oral QID Tegeler, Canary Brim, MD   1.5 tablet at 03/10/18 0909  . Carbidopa-Levodopa ER (SINEMET CR) 25-100 MG tablet  controlled release 1 tablet  1 tablet Oral Daily Tegeler, Canary Brim, MD   1 tablet at 03/10/18 0912  . cholecalciferol (VITAMIN D3) tablet 1,000 Units  1,000 Units Oral Daily Tegeler, Canary Brim, MD   1,000 Units at 03/10/18 0914  . citalopram (CELEXA) tablet 20 mg  20 mg Oral QHS Tegeler, Canary Brim, MD   20 mg at 03/10/18 0114  . clonazePAM (KLONOPIN) tablet 0.25  mg  0.25 mg Oral TID Tegeler, Canary Brim, MD   0.25 mg at 03/10/18 0932  . feeding supplement (ENSURE ENLIVE) (ENSURE ENLIVE) liquid 237 mL  237 mL Oral BID BM Tegeler, Canary Brim, MD   237 mL at 03/10/18 0936  . fluorometholone (FML) 0.1 % ophthalmic suspension 1 drop  1 drop Left Eye Daily Tegeler, Canary Brim, MD   1 drop at 03/10/18 0917  . levofloxacin (LEVAQUIN) tablet 250 mg  250 mg Oral Daily Terrilee Files, MD   250 mg at 03/10/18 0916  . levothyroxine (SYNTHROID, LEVOTHROID) tablet 50 mcg  50 mcg Oral QAC breakfast Tegeler, Canary Brim, MD   50 mcg at 03/10/18 [phone]  . memantine (NAMENDA) tablet 10 mg  10 mg Oral BID Tegeler, Canary Brim, MD   10 mg at 03/10/18 0917  . pantoprazole (PROTONIX) EC tablet 40 mg  40 mg Oral Daily Tegeler, Canary Brim, MD   40 mg at 03/10/18 0914  . perphenazine (TRILAFON) tablet 4 mg  4 mg Oral BID Tegeler, Canary Brim, MD   4 mg at 03/10/18 0915  . phenazopyridine (PYRIDIUM) tablet 200 mg  200 mg Oral TID WC Terrilee Files, MD      . polyvinyl alcohol (LIQUIFILM TEARS) 1.4 % ophthalmic solution 1 drop  1 drop Both Eyes QID PRN Tegeler, Canary Brim, MD      . Melene Muller ON 03/11/2018] senna-docusate (Senokot-S) tablet 1 tablet  1 tablet Oral QHS Tegeler, Canary Brim, MD      . sodium chloride (MURO 128) 5 % ophthalmic ointment 1 application  1 application Right Eye QHS Tegeler, Canary Brim, MD      . sodium fluoride (PREVIDENT 5000 PLUS) 1.1 % dental cream 1 application  1 application dental QPM Tegeler, Canary Brim, MD      . triazolam (HALCION) tablet 0.125 mg   0.125 mg Oral QHS Tegeler, Canary Brim, MD      . trimethoprim (TRIMPEX) tablet 100 mg  100 mg Oral Daily Tegeler, Canary Brim, MD   100 mg at 03/10/18 0915   Current Outpatient Medications  Medication Sig Dispense Refill  . acetaminophen (TYLENOL) 325 MG tablet Take 650 mg by mouth 2 (two) times daily as needed for moderate pain.    Marland Kitchen alum & mag hydroxide-simeth (MAALOX/MYLANTA) 200-200-20 MG/5ML suspension Take 15 mLs by mouth 2 (two) times daily as needed for indigestion or heartburn.    Marland Kitchen atenolol (TENORMIN) 25 MG tablet Take 25 mg by mouth 2 (two) times daily.     . carbidopa-levodopa (SINEMET IR) 25-100 MG tablet Take 1.5 tablets by mouth 4 (four) times daily. (Patient taking differently: Take 1.5 tablets by mouth 4 (four) times daily. 9 AM, 1 PM, 5 PM, 9 PM) 180 tablet 5  . Carbidopa-Levodopa ER (SINEMET CR) 25-100 MG tablet controlled release Take 1 tablet by mouth every morning.    . citalopram (CELEXA) 20 MG tablet Take 1 tablet (20 mg total) by mouth at bedtime. 30 tablet 5  . clonazePAM (KLONOPIN) 0.5 MG tablet Take 0.25 mg by mouth 3 (three) times daily.     . fluorometholone (FML) 0.1 % ophthalmic suspension Place 1 drop into the left eye daily.    Marland Kitchen levothyroxine (SYNTHROID, LEVOTHROID) 50 MCG tablet Take 50 mcg by mouth daily before breakfast.    . memantine (NAMENDA) 10 MG tablet Take 10 mg by mouth 2 (two) times daily.     . Nutritional Supplements (ENSURE ORIGINAL) LIQD Take 8 oz  by mouth 2 (two) times daily.     . pantoprazole (PROTONIX) 20 MG tablet Take 2 tablets (40 mg total) by mouth daily. 60 tablet 0  . perphenazine (TRILAFON) 4 MG tablet Take 4 mg by mouth 2 (two) times daily.    . polyvinyl alcohol (LIQUIFILM TEARS) 1.4 % ophthalmic solution Place 1 drop into both eyes 4 (four) times daily as needed for dry eyes.     Marland Kitchen sennosides-docusate sodium (SENOKOT-S) 8.6-50 MG tablet Take 2 tablets by mouth at bedtime.    . sodium chloride (MURO 128) 5 % ophthalmic ointment  Place 1 application into the right eye at bedtime. For hx of Corneal transplant    . sodium fluoride (PREVIDENT 5000 PLUS) 1.1 % CREA dental cream Place 1 application onto teeth every evening.    . triazolam (HALCION) 0.25 MG tablet Take 0.125 mg by mouth at bedtime.     Marland Kitchen trimethoprim (TRIMPEX) 100 MG tablet Take 100 mg by mouth daily.    . Vitamin D, Cholecalciferol, 1000 units CAPS Take 1 capsule by mouth daily.    . Alum & Mag Hydroxide-Simeth (GI COCKTAIL) SUSP suspension Take 30 mLs by mouth 2 (two) times daily as needed for indigestion (Abdominal pain). Shake well. (Patient not taking: Reported on 03/09/2018) 30 mL 0  . Mouthwashes (BIOTENE DRY MOUTH MT) Use as directed in the mouth or throat 2 (two) times daily.     PTA Medications: (Not in a hospital admission)   Musculoskeletal: Strength & Muscle Tone: within normal limits Gait & Station: normal Patient leans: N/A  Psychiatric Specialty Exam: Physical Exam  Nursing note and vitals reviewed. Constitutional: She is oriented to person, place, and time. She appears well-developed and well-nourished.  HENT:  Head: Normocephalic and atraumatic.  Neck: Normal range of motion.  Respiratory: Effort normal.  Musculoskeletal: Normal range of motion.  Neurological: She is alert and oriented to person, place, and time.  Psychiatric: She has a normal mood and affect. Her speech is normal and behavior is normal. Judgment and thought content normal. Cognition and memory are normal.    Review of Systems  Psychiatric/Behavioral: Negative for hallucinations and suicidal ideas.  All other systems reviewed and are negative.   Blood pressure 139/62, pulse (!) 51, temperature 98.3 F (36.8 C), temperature source Oral, resp. rate 16, SpO2 97 %.There is no height or weight on file to calculate BMI.  General Appearance: Casual and Fairly Groomed  Eye Contact:  Fair  Speech:  Clear and Coherent and Normal Rate  Volume:  Normal  Mood:  pleasant   Affect:  Congruent  Thought Process:  Coherent, Linear and Descriptions of Associations: Intact  Orientation:  Full (Time, Place, and Person)  Thought Content:  WDL age appropriate responses  Suicidal Thoughts:  No  Homicidal Thoughts:  No  Memory:  Immediate;   Fair Recent;   Fair Remote;   Fair  Judgement:  Intact  Insight:  Fair  Psychomotor Activity:  Normal  Concentration:  Concentration: Fair and Attention Span: Good  Recall:  [employer] of Knowledge:  Fair  Language:  Fair  Akathisia:  No  Handed:  Right  AIMS (if indicated):   N/A  Assets:  Communication Skills Desire for Improvement Financial Resources/Insurance Housing Leisure Time Social Support Vocational/Educational  ADL's:  Intact with assistance  Cognition:  WNL  Sleep:   N/A     Demographic Factors:  Age 78 or older and Caucasian  Loss Factors: NA  Historical Factors:  NA  Risk Reduction Factors:   Religious beliefs about death, Positive social support, Positive therapeutic relationship and Positive coping skills or problem solving skills  Continued Clinical Symptoms:  Previous Psychiatric Diagnoses and Treatments  Cognitive Features That Contribute To Risk:  None    Suicide Risk:  Minimal: No identifiable suicidal ideation.  Patients presenting with no risk factors but with morbid ruminations; may be classified as minimal risk based on the severity of the depressive symptoms    Plan Of Care/Follow-up recommendations:  Other:  Continue taking Levofloxacin as prescribed. Repeat UA suggested in 7 days to assess for complete resolution of UTI.   Disposition: Discharge home.  Maryagnes Amosakia S Starkes-Perry, FNP 03/10/2018, 12:04 PM   Patient seen face-to-face for psychiatric evaluation, chart reviewed and case discussed with the physician extender and developed treatment plan. Reviewed the information documented and agree with the treatment plan.  Juanetta BeetsJacqueline Neidy Guerrieri, DO 03/10/18 3:16 PM

## 2018-03-10 NOTE — ED Notes (Signed)
Patient urinated on bedside commode 82ml.

## 2018-03-10 NOTE — ED Notes (Signed)
[facility] to pick up patient around 1330 today.

## 2018-03-10 NOTE — ED Notes (Signed)
Spoke with Pharmacy Tech. PharmD still in process of signing off on medication orders.

## 2018-03-10 NOTE — ED Notes (Signed)
Sitter alerted nurse that patient is having a hard time passing her urine.  Bladder scan done and it showed 250ml.  Dr. Charm BargesButler notified that p[atient seemed uncomfortable and could not pass urine.  Nurse obtained bedside commode and sitter and nurse assisted patient on commode.  Patient still could not pass her urine.  Dr. Charm BargesButler advised to wait a while longer as 250ml would not cause any ill effects.

## 2018-03-10 NOTE — ED Notes (Addendum)
Patient awake alert.  Patient took morning meds with applesauce followed by milk.  Sitter is feeding her and she is taking sips of her ensure.  Patient started on Levaquin for a UTI.

## 2018-03-12 LAB — URINE CULTURE: Culture: 70000 — AB

## 2018-03-13 ENCOUNTER — Telehealth: Payer: Self-pay | Admitting: Emergency Medicine

## 2018-03-13 NOTE — Telephone Encounter (Signed)
Post ED Visit - Positive Culture Follow-up: Successful Patient Follow-Up  Culture assessed and recommendations reviewed by:  []  Enzo Bi, Pharm.D. []  Celedonio Miyamoto, Pharm.D., BCPS AQ-ID []  Garvin Fila, Pharm.D., BCPS []  Georgina Pillion, Pharm.D., BCPS []  [place], [address].D., BCPS, AAHIVP []  Estella Husk, Pharm.D., BCPS, AAHIVP []  Lysle Pearl, PharmD, BCPS []  Phillips Climes, PharmD, BCPS []  Agapito Games, PharmD, BCPS []  Verlan Friends, PharmD Ewing Schlein PharmD  Positive urine culture  []  Patient discharged without antimicrobial prescription and treatment is now indicated []  Organism is resistant to prescribed ED discharge antimicrobial []  Patient with positive blood cultures  Changes discussed with ED provider: Leonia Corona PA Results of urine culture faxed to [facility] facility @ [phone] order to Stop levofloxacin     Berle Mull 03/13/2018, 10:07 AM

## 2018-04-09 ENCOUNTER — Other Ambulatory Visit: Payer: Self-pay

## 2018-04-27 ENCOUNTER — Encounter: Payer: Self-pay | Admitting: Neurology

## 2018-04-27 ENCOUNTER — Ambulatory Visit (INDEPENDENT_AMBULATORY_CARE_PROVIDER_SITE_OTHER): Payer: Medicare (Managed Care) | Admitting: Neurology

## 2018-04-27 VITALS — BP 134/68 | HR 70

## 2018-04-27 DIAGNOSIS — G20 Parkinson's disease: Secondary | ICD-10-CM | POA: Diagnosis not present

## 2018-04-27 NOTE — Progress Notes (Signed)
Subjective:    Patient ID: Deanna Page is a 85 y.o. female.  HPI     Interim history:  Deanna Page is an 85 year old left-handed woman with an underlying medical history of allergic rhinitis, asthma, depression, hypertension, reflux disease, history of TIA, diverticulosis, hypothyroidism, obstructive sleep apnea and colitis, who presents for follow-up consultation of her parkinsonism. The patient is accompanied by her husband again today. I last saw her on 04/17/2017, at which time she reported that she did not wish to go back to [facility] She was off of Requip. She was taking Sinemet 1-1/2 pills 4 times a day, no longer taking Sinemet CR at night. She had constipation. She was not taking MiraLAX on a regular basis.  She saw Ward Givens, nurse practitioner in the interim on 10/20/2017, at which time she was referred to speech therapy for swallow evaluation and barium swallow study. She had this evaluation on 11/20/2017, at which time she was noted to have trace laryngeal penetration and was deemed a moderate aspiration risk. She was in the hospital from 02/11/2018 through 02/12/2018 for chest pain. Workup for cardiac event was negative and she was deemed to have atypical chest pain. She was seen in the emergency room 03/09/2018 for suicidal ideation and suicidal threat. She was evaluated by behavioral health and admission was not deemed necessary.  Today, 04/27/2018: She reports Very little today. Her husband reports that her Celexa was increased. One and a half pills daily. She has had no falls, she does cough when she swallows but no actual choking episodes per husband.   The patient's allergies, current medications, family history, past medical history, past social history, past surgical history and problem list were reviewed and updated as appropriate.    Previously (copied from previous notes for reference):    I saw her on 04/15/2016, at which time she reported that the  medication was not helpful, I did suggest that she increase her Sinemet to some degree. She was having some issues with constipation, and help at home through a home health agency. I suggested a second opinion. She saw Dr. Tonye Royalty at [facility] in the interim in July 2018 and had a subsequent follow-up with his nurse practitioner on 12/24/2016, at which time she was advised to continue with Sinemet 2 tablets 3 times a day. She was off of Requip at the time, she declined referral to PT, OT and ST. She had an interim brain MRI without contrast through her primary care physician which I reviewed: IMPRESSION: No acute or reversible finding. Atrophy an extensive chronic small vessel ischemic changes throughout the brain as outlined above. Volume loss in the substantia nigra regions consistent with the clinical diagnosis of Parkinson's disease.   Her husband requested a referral to the [facility] but they ended up not pursuing this, as I understand.     I saw her on 10/09/2015, at which time she reported more difficulty with her balance. Her walking was worse. She had trouble feeding herself. She was on ropinirole 0.5 mg 3 times a day and generic Sinemet 1 pill 3 times a day, she was not starting her first dose in the morning as she was sleeping in late every day, she had constipation, erratic bowel movements. Her husband was working 3 days a week, she had held 3 days week from 10:30 AM to 4 PM. She was typically not getting her first dose of Sinemet before noon. Her MMSE was 26 out of 30, clock drawing  was 1 out of 4, animal fluency 9/m. She was encouraged to drink more water, change her sleep schedule and start the Sinemet earlier in the morning and increase it to 4 times a day at 9 AM, 1 PM, 5 PM and 9 PM. She was encouraged to continue with Requip 0.5 mg 3 times a day and Celexa 20 mg daily.   I saw her on 04/11/2015, at which time she reported feeling stable for the most part, some increase in anxiety, no  recent falls, memory, per husband, was stable. She denied overt depression, had been on the same dose of Celexa for years. She was on Sinemet 4 times a day, usually around 10 AM, 2 PM, 5 PM and 9 PM and Requip 0.5 mg 3 times a day. For occasional constipation, she was on Dulcolax. She was using her wheelchair or rolling walker. Her MMSE was 24/30, CDT: 1/4, AFT: 8/min at the time. I suggested we increase her Celexa to 20 mg. She was on Namenda generic twice daily.    I saw her on 12/12/2014, at which time she reported doing fairly. Her husband was working 3 days a week, on Tuesdays, Wednesdays and Thursdays. She had an aid on those days. She had no recent falls, thankfully. She was on Sinemet about 3 times a day. Her first dose may not be until noon as she was sleeping in late, till about 10:30 AM. She was going t bed around 10:30 PM but was up multiple times a night. She was skipping breakfast and was not drinking enough. I suggested she increase Sinemet to 4 times a day, 10:30, 1:30 PM, 5:30 PM and 7:30 PM. I asked her to continue Requip 0.5 mg 3 times a day.    I first met her on 08/17/2014, at which time she reported no significant improvement of her symptoms on her medications. She had tried Neupro patch 6 mg in the past but this was stopped because it did not help. She was using hydrocodone at night for sleep and had tried sleeping without it but could not sleep. She was taking half a pill at night. She was not taking the Sinemet or Requip the way it had been suggested by Dr. [name] and was unclear why changes had not been implemented. She was on Sinemet 25-100 milligrams strength one pill twice daily at 10 AM and 2 PM and she would take a third pill sometimes around 6 PM. She did not think was very helpful. She was taking 0.5 mg of Requip twice daily. She had been advised to increase Sinemet to 1-1/2 pills 4 times a day and increase Requip to 1 pill 3 times a day in August 2015. She was no longer on  Artane for unclear reasons.   She had seen Dr. [name] in the past and also Dr. [name] in this office. Symptoms go back to about 5 years ago when she started having tremors and gait disorder as well as problems getting in and out of chairs. At the house she uses a 4 wheeled walker. She reported that she typically skipped breakfast. I suggested that we increase Sinemet to 1 pill 4 times a day. I asked her to increase Requip 0.5 mg to 1 pill 3 times a day. She was advised to drink more water and to not skip any meals.   She previously saw Dr. [name] and before then she saw Dr. [name] in our office. I reviewed Dr. Hazle Quant note from 10/04/2013, which  is summarized below.    (10/04/2013, Dr. Wilber Bihari):  "HPI:  BRINLEE GAMBRELL is a 85 y.o. female here for a PD follow up. Was followed by Dr [name] in the past. Last visit was 05/2013 at which time she was started on Neupro patch. They note they stopped this a few days after she started and went back to Requip. Unclear why it was stopped, they are also unsure what dose of Requip she is currently on.  Currently taking Sinemet 25-100 TID, they note she takes it at "various times". Takes the 1st dose around 11, takes next dose at 4pm, takes the last dose at bedtime. Notes she forgets to take it a few times a week. Often takes it on a different schedule. Notes improvement when taking this medication, feels more relaxed. Has decreased tremor and muscle shaking. Main concern today is increased shaking and severe anxiety.    Initial visit 02/2013 (Dr. Wilber Bihari): Diagnosed with PD around 4 to 5 years ago. Initially noted rest tremor and difficulty walking. Predominantly a rest tremor but has mild action/postural component. Notes muscle stiffness. Notes some generalized bradykinesia, some shuffling of gait. No falls recently. Has some RBD. Has some constipation. No memory troubles, no hallucinations. No difficulty with sense of smell. Currently taking Requip 67m 3x a day,  has been on this regimen since diagnosis. Also taking Artane 275mdaily. Tried Sinemet in the past, unsure if it helped but noted it caused a lot of problems.   Current main concerns with PD are tremors, bradykinesia and gait difficulty. Has family history of PD."     Her Past Medical History Is Significant For: Past Medical History:  Diagnosis Date  . Allergic rhinitis   . Anxiety   . Asthma   . Bowel obstruction ([facility]  . CKD (chronic kidney disease), stage III ([facility]  . Dementia ([facility]  . Diverticulosis   . Esophageal dysmotility   . Esophageal stricture 2005  . GERD (gastroesophageal reflux disease)   . Hepatitis A   . Hiatal hernia   . Hypertension   . Hypothyroidism   . Internal hemorrhoids   . Irritable bowel syndrome   . Ischemic colitis, enteritis, or enterocolitis ([facility]  . Major depression    with paranoia and insomnia  . OSA (obstructive sleep apnea)   . Parkinson disease ([facility]  . Pneumonia   . TIA (transient ischemic attack)   . Urinary incontinence   . Vitamin D deficiency     Her Past Surgical History Is Significant For: Past Surgical History:  Procedure Laterality Date  . ABDOMINAL HYSTERECTOMY  1969  . APPENDECTOMY  1969  . BLADDER SURGERY    . BREAST LUMPECTOMY  1995  . CATARACT EXTRACTION    . CHOLECYSTECTOMY  2000  . CORNEAL TRANSPLANT Left   . INTRAOCULAR PROSTHESES INSERTION     [facility]. TUBAL LIGATION  1968    Her Family History Is Significant For: Family History  Problem Relation Age of Onset  . Stroke Mother   . Parkinson's disease Mother   . Lung cancer Father   . ALS Sister   . Liver disease Sister   . Parkinson's disease Maternal Grandmother   . Parkinson's disease Maternal Aunt   . Emphysema Sister   . Emphysema Brother   . Colon cancer Neg Hx   . Colon polyps Neg Hx   . Diabetes Neg Hx   . Gallbladder disease Neg Hx   .  Esophageal cancer Neg Hx     Her Social History Is Significant For: Social History    Socioeconomic History  . Marital status: Married    Spouse name: Herbie Baltimore  . Number of children: 3  . Years of education: 40  . Highest education level: Not on file  Occupational History  . Occupation: Retired    Fish farm manager: RETIRED  Social Needs  . Financial resource strain: Not on file  . Food insecurity:    Worry: Not on file    Inability: Not on file  . Transportation needs:    Medical: Not on file    Non-medical: Not on file  Tobacco Use  . Smoking status: Never Smoker  . Smokeless tobacco: Never Used  Substance and Sexual Activity  . Alcohol use: No    Alcohol/week: 0.0 standard drinks  . Drug use: No  . Sexual activity: Not on file  Lifestyle  . Physical activity:    Days per week: Not on file    Minutes per session: Not on file  . Stress: Not on file  Relationships  . Social connections:    Talks on phone: Not on file    Gets together: Not on file    Attends religious service: Not on file    Active member of club or organization: Not on file    Attends meetings of clubs or organizations: Not on file    Relationship status: Not on file  Other Topics Concern  . Not on file  Social History Narrative   Patient is married Herbie Baltimore) and lives with her husband.   Patient has three children.   Patient is retired.   Patient has an high school education.   Patient does drink caffeine beverages.   Patient is left handed.          Her Allergies Are:  Allergies  Allergen Reactions  . Erythromycin     Other reaction(s): Other (See Comments) Other Reaction: GI Upset Unknown reaction  . Penicillins Rash and Shortness Of Breath    Unknown reaction Has patient had a PCN reaction causing immediate rash, facial/tongue/throat swelling, SOB or lightheadedness with hypotension: Unknown Has patient had a PCN reaction causing severe rash involving mucus membranes or skin necrosis: Unknown Has patient had a PCN reaction that required hospitalization: Unknown Has patient  had a PCN reaction occurring within the last 10 years: Unknown If all of the above answers are "NO", then may proceed with Cephalosporin use.   [name] [Donepezil Hcl]     Unknown reaction  . Clindamycin/Lincomycin Itching  . Doxycycline     Unknown reaction  . Pneumococcal Vaccines     Unknown reaction  :   Her Current Medications Are:  Outpatient Encounter Medications as of 04/27/2018  Medication Sig  . acetaminophen (TYLENOL) 325 MG tablet Take 650 mg by mouth 2 (two) times daily as needed for moderate pain.  Marland Kitchen alum & mag hydroxide-simeth (MAALOX/MYLANTA) 200-200-20 MG/5ML suspension Take 15 mLs by mouth 2 (two) times daily as needed for indigestion or heartburn.  Marland Kitchen atenolol (TENORMIN) 25 MG tablet Take 25 mg by mouth 2 (two) times daily.   . Carbidopa-Levodopa ER (SINEMET CR) 25-100 MG tablet controlled release Take 1 tablet by mouth every morning.  . citalopram (CELEXA) 20 MG tablet Take 1 tablet (20 mg total) by mouth at bedtime.  . clonazePAM (KLONOPIN) 0.5 MG tablet Take 0.25 mg by mouth 3 (three) times daily.   . fluorometholone (FML) 0.1 % ophthalmic suspension  Place 1 drop into the left eye daily.  Marland Kitchen levofloxacin (LEVAQUIN) 250 MG tablet Take 1 tablet (250 mg total) by mouth daily.  Marland Kitchen levothyroxine (SYNTHROID, LEVOTHROID) 50 MCG tablet Take 50 mcg by mouth daily before breakfast.  . memantine (NAMENDA) 10 MG tablet Take 10 mg by mouth 2 (two) times daily.   . Nutritional Supplements (ENSURE ORIGINAL) LIQD Take 8 oz by mouth 2 (two) times daily.   . pantoprazole (PROTONIX) 20 MG tablet Take 2 tablets (40 mg total) by mouth daily.  Marland Kitchen perphenazine (TRILAFON) 4 MG tablet Take 4 mg by mouth 2 (two) times daily.  . phenazopyridine (PYRIDIUM) 200 MG tablet Take 1 tablet (200 mg total) by mouth 3 (three) times daily with meals.  . polyvinyl alcohol (LIQUIFILM TEARS) 1.4 % ophthalmic solution Place 1 drop into both eyes 4 (four) times daily as needed for dry eyes.   Marland Kitchen  sennosides-docusate sodium (SENOKOT-S) 8.6-50 MG tablet Take 2 tablets by mouth at bedtime.  . sodium chloride (MURO 128) 5 % ophthalmic ointment Place 1 application into the right eye at bedtime. For hx of Corneal transplant  . sodium fluoride (PREVIDENT 5000 PLUS) 1.1 % CREA dental cream Place 1 application onto teeth every evening.  . triazolam (HALCION) 0.25 MG tablet Take 0.125 mg by mouth at bedtime.   Marland Kitchen trimethoprim (TRIMPEX) 100 MG tablet Take 100 mg by mouth daily.  . Vitamin D, Cholecalciferol, 1000 units CAPS Take 1 capsule by mouth daily.   No facility-administered encounter medications on file as of 04/27/2018.   :  Review of Systems:  Out of a complete 14 point review of systems, all are reviewed and negative with the exception of these symptoms as listed below: Review of Systems  Neurological:       Pt presents today to discuss her PD.     Objective:  Neurological Exam  Physical Exam Physical Examination:   Vitals:   04/27/18 1252  BP: 134/68  Pulse: 70    General Examination: The patient is a very pleasant 85 y.o. female in no acute distress. She appears frail and deconditioned, she is in the wheelchair.  HEENT:Normocephalic, atraumatic, pupils are equal, round and reactive to light and accommodation. Status post bilateral cataract repairs. Extraocular tracking is difficult, limitation of downgaze, moderate decrease in eye blink rate, face is symmetric with moderate facial masking and significant lower lip and jaw tremor. Neck is severely rigid. She has no significant airway crowding, hearing is mildly impaired. There is no drooling. No facial dyskinesias are noted. tongue and palate are symmetrical.  Chest:Clear to auscultation without wheezing, rhonchi or crackles noted.  Heart:S1+S2+0, regular and normal with systolic heart murmur noted.    Abdomen:Soft, non-tender and non-distended with normal bowel sounds appreciated on  auscultation.  Extremities:There isnopitting edema in the distal lower extremities bilaterally. Pedal pulses are intact.  Skin: Warm and dry without trophic changes noted.  Musculoskeletal: exam reveals no obvious joint deformities, tenderness or joint swelling or erythema.   Neurologically:  Mental status: The patient is awake, alert and oriented to self, circumstance. Herimmediate and remote memory, attention, language skills and fund of knowledge are impaired.Affect is constricted.  In June 2016: Her MMSE score is 27/30. CDT is 0/4. AFT (Animal Fluency Test) score is 6. Geriatric Depression Scale Score is 9.   On 04/11/2015: MMSE: 24/30, CDT: 1/4, AFT: 8/min.  On 10/09/2015: MMSE: 26/30, CDT: 1/4, AFT: 9/min.  On2/02/2017: MMSE: 21/30, CDT: not tested, AFT: 6/min.  Cranial nerves  II - XII are as described above under HEENT exam.  Motor exam:Thin bulk, global strength of 4/5, no dyskinesias, tone is mild to moderately rigid, some cogwheeling noted in both upper extremities, moderate to severe bradykinesia noted. There is no drift. Reflexes are about 1+ throughout. On fine motor testing she has moderate to severe difficulty throughout. She has a moderate resting tremor in both upper extremities, no tremor in the lower extremities. She has no significant lateralization. Posture and gait are not testable today. Sensory exam is intact to light touch in the upper and lower extremities. She is not safe to stand unsupported, Romberg is not testable.In [place].  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 She has been on levodopa therapy. She had previous MRI testing, she also had  consultation at [facility]. She continues to be on Sinemet CR once daily in the morning and Sinemet immediate release one half pills 4 times a day. I suggested we continue with this. Of note, with time, she has had complications includingsome swallowing difficulties, deconditioning, memory loss, sensitivities at side effects of medications. She has been on generic Namenda 10 mg twice daily. She's no longer on Artane, no longer on Neupro or Aricept, no longer on Requip since last year. She has had worsening depression. She had a recent increase in her Celexa to 30 mg daily. Exam appears stable. I suggested a 6 month follow-up, she can see Ward Givens, NP, again next time. I answered all their questions today and the patient and her husband were in agreement. I spent 25 minutes in total face-to-face time with the patient, more than 50% of which was spent in counseling and coordination of care, reviewing test results, reviewing medication and discussing or reviewing the diagnosis of atypical parkinsonism, its prognosis and treatment options. Pertinent laboratory and imaging test results that were available during this visit with the patient were reviewed by me and considered in my medical decision making (see chart for details).

## 2018-04-27 NOTE — Patient Instructions (Addendum)
I noticed a slight heart murmur today. Please discuss with primary care. You may benefit from seeing a heart doctor.  Please follow up with Butch Penny, NP in 6 months here.  We will keep the Sinemet 25/100 mg at 1.5 pills 4 times a day and the Sinemet CR once in the morning daily.

## 2018-05-27 ENCOUNTER — Other Ambulatory Visit: Payer: Self-pay | Admitting: Adult Health

## 2018-05-27 MED ORDER — CLONAZEPAM 0.5 MG PO TABS: 0.5000 mg | ORAL_TABLET | Freq: Three times a day (TID) | ORAL | 0 refills | Status: DC

## 2018-07-21 ENCOUNTER — Emergency Department (HOSPITAL_COMMUNITY): Payer: Medicare (Managed Care)

## 2018-07-21 ENCOUNTER — Observation Stay (HOSPITAL_COMMUNITY): Payer: Medicare (Managed Care)

## 2018-07-21 ENCOUNTER — Inpatient Hospital Stay (HOSPITAL_COMMUNITY)
Admission: EM | Admit: 2018-07-21 | Discharge: 2018-07-25 | DRG: 493 | Disposition: A | Discharge status: Skilled Nursing Facility | Payer: Medicare (Managed Care) | Source: Skilled Nursing Facility | Attending: Student in an Organized Health Care Education/Training Program | Admitting: Student in an Organized Health Care Education/Training Program

## 2018-07-21 ENCOUNTER — Encounter (HOSPITAL_COMMUNITY): Payer: Self-pay

## 2018-07-21 ENCOUNTER — Other Ambulatory Visit: Payer: Self-pay

## 2018-07-21 ENCOUNTER — Inpatient Hospital Stay (HOSPITAL_COMMUNITY): Payer: Medicare (Managed Care)

## 2018-07-21 DIAGNOSIS — Z1159 Encounter for screening for other viral diseases: Secondary | ICD-10-CM

## 2018-07-21 DIAGNOSIS — R1311 Dysphagia, oral phase: Secondary | ICD-10-CM | POA: Diagnosis present

## 2018-07-21 DIAGNOSIS — Z8673 Personal history of transient ischemic attack (TIA), and cerebral infarction without residual deficits: Secondary | ICD-10-CM

## 2018-07-21 DIAGNOSIS — Z79899 Other long term (current) drug therapy: Secondary | ICD-10-CM

## 2018-07-21 DIAGNOSIS — K219 Gastro-esophageal reflux disease without esophagitis: Secondary | ICD-10-CM | POA: Diagnosis present

## 2018-07-21 DIAGNOSIS — E8889 Other specified metabolic disorders: Secondary | ICD-10-CM | POA: Diagnosis present

## 2018-07-21 DIAGNOSIS — I639 Cerebral infarction, unspecified: Secondary | ICD-10-CM | POA: Diagnosis not present

## 2018-07-21 DIAGNOSIS — E785 Hyperlipidemia, unspecified: Secondary | ICD-10-CM | POA: Diagnosis present

## 2018-07-21 DIAGNOSIS — F419 Anxiety disorder, unspecified: Secondary | ICD-10-CM | POA: Diagnosis present

## 2018-07-21 DIAGNOSIS — E860 Dehydration: Secondary | ICD-10-CM | POA: Diagnosis present

## 2018-07-21 DIAGNOSIS — W19XXXA Unspecified fall, initial encounter: Secondary | ICD-10-CM | POA: Diagnosis not present

## 2018-07-21 DIAGNOSIS — F0280 Dementia in other diseases classified elsewhere without behavioral disturbance: Secondary | ICD-10-CM | POA: Diagnosis not present

## 2018-07-21 DIAGNOSIS — W06XXXA Fall from bed, initial encounter: Secondary | ICD-10-CM | POA: Diagnosis present

## 2018-07-21 DIAGNOSIS — Y92122 Bedroom in nursing home as the place of occurrence of the external cause: Secondary | ICD-10-CM

## 2018-07-21 DIAGNOSIS — D62 Acute posthemorrhagic anemia: Secondary | ICD-10-CM | POA: Diagnosis not present

## 2018-07-21 DIAGNOSIS — S42202A Unspecified fracture of upper end of left humerus, initial encounter for closed fracture: Secondary | ICD-10-CM | POA: Diagnosis not present

## 2018-07-21 DIAGNOSIS — I6930 Unspecified sequelae of cerebral infarction: Secondary | ICD-10-CM

## 2018-07-21 DIAGNOSIS — Y92129 Unspecified place in nursing home as the place of occurrence of the external cause: Secondary | ICD-10-CM

## 2018-07-21 DIAGNOSIS — N183 Chronic kidney disease, stage 3 unspecified: Secondary | ICD-10-CM | POA: Insufficient documentation

## 2018-07-21 DIAGNOSIS — N179 Acute kidney failure, unspecified: Secondary | ICD-10-CM | POA: Diagnosis present

## 2018-07-21 DIAGNOSIS — E039 Hypothyroidism, unspecified: Secondary | ICD-10-CM | POA: Diagnosis present

## 2018-07-21 DIAGNOSIS — T148XXA Other injury of unspecified body region, initial encounter: Secondary | ICD-10-CM

## 2018-07-21 DIAGNOSIS — Z87440 Personal history of urinary (tract) infections: Secondary | ICD-10-CM | POA: Diagnosis not present

## 2018-07-21 DIAGNOSIS — J45909 Unspecified asthma, uncomplicated: Secondary | ICD-10-CM | POA: Diagnosis present

## 2018-07-21 DIAGNOSIS — G20 Parkinson's disease: Secondary | ICD-10-CM | POA: Diagnosis present

## 2018-07-21 DIAGNOSIS — I10 Essential (primary) hypertension: Secondary | ICD-10-CM | POA: Diagnosis present

## 2018-07-21 DIAGNOSIS — N390 Urinary tract infection, site not specified: Secondary | ICD-10-CM | POA: Diagnosis present

## 2018-07-21 DIAGNOSIS — Z9049 Acquired absence of other specified parts of digestive tract: Secondary | ICD-10-CM | POA: Diagnosis not present

## 2018-07-21 DIAGNOSIS — Z7982 Long term (current) use of aspirin: Secondary | ICD-10-CM | POA: Diagnosis not present

## 2018-07-21 DIAGNOSIS — I63532 Cerebral infarction due to unspecified occlusion or stenosis of left posterior cerebral artery: Secondary | ICD-10-CM

## 2018-07-21 DIAGNOSIS — R339 Retention of urine, unspecified: Secondary | ICD-10-CM | POA: Diagnosis not present

## 2018-07-21 DIAGNOSIS — Z90710 Acquired absence of both cervix and uterus: Secondary | ICD-10-CM

## 2018-07-21 DIAGNOSIS — Z419 Encounter for procedure for purposes other than remedying health state, unspecified: Secondary | ICD-10-CM

## 2018-07-21 DIAGNOSIS — Z79890 Hormone replacement therapy (postmenopausal): Secondary | ICD-10-CM

## 2018-07-21 DIAGNOSIS — S42292A Other displaced fracture of upper end of left humerus, initial encounter for closed fracture: Principal | ICD-10-CM

## 2018-07-21 DIAGNOSIS — R1310 Dysphagia, unspecified: Secondary | ICD-10-CM | POA: Diagnosis not present

## 2018-07-21 DIAGNOSIS — D509 Iron deficiency anemia, unspecified: Secondary | ICD-10-CM | POA: Diagnosis present

## 2018-07-21 DIAGNOSIS — F329 Major depressive disorder, single episode, unspecified: Secondary | ICD-10-CM | POA: Diagnosis present

## 2018-07-21 DIAGNOSIS — N289 Disorder of kidney and ureter, unspecified: Secondary | ICD-10-CM

## 2018-07-21 DIAGNOSIS — I129 Hypertensive chronic kidney disease with stage 1 through stage 4 chronic kidney disease, or unspecified chronic kidney disease: Secondary | ICD-10-CM | POA: Diagnosis present

## 2018-07-21 DIAGNOSIS — G20A1 Parkinson's disease without dyskinesia, without mention of fluctuations: Secondary | ICD-10-CM

## 2018-07-21 DIAGNOSIS — D649 Anemia, unspecified: Secondary | ICD-10-CM

## 2018-07-21 DIAGNOSIS — G4733 Obstructive sleep apnea (adult) (pediatric): Secondary | ICD-10-CM | POA: Diagnosis present

## 2018-07-21 LAB — PROTIME-INR
INR: 1 (ref 0.8–1.2)
Prothrombin Time: 13.5 seconds (ref 11.4–15.2)

## 2018-07-21 LAB — URINALYSIS, ROUTINE W REFLEX MICROSCOPIC
Bacteria, UA: NONE SEEN
Bilirubin Urine: NEGATIVE
Glucose, UA: NEGATIVE mg/dL
Hgb urine dipstick: NEGATIVE
Ketones, ur: 5 mg/dL — AB
Nitrite: NEGATIVE
Protein, ur: NEGATIVE mg/dL
Specific Gravity, Urine: 1.017 (ref 1.005–1.030)
WBC, UA: >50 WBC/hpf — ABNORMAL HIGH (ref 0–5)
pH: 5 (ref 5.0–8.0)

## 2018-07-21 LAB — COMPREHENSIVE METABOLIC PANEL
ALT: 7 U/L (ref 0–44)
AST: 26 U/L (ref 15–41)
Albumin: 3.8 g/dL (ref 3.5–5.0)
Alkaline Phosphatase: 80 U/L (ref 38–126)
Anion gap: 14 (ref 5–15)
BUN: 31 mg/dL — ABNORMAL HIGH (ref 8–23)
CO2: 22 mmol/L (ref 22–32)
Calcium: 9.8 mg/dL (ref 8.9–10.3)
Chloride: 106 mmol/L (ref 98–111)
Creatinine, Ser: 1.53 mg/dL — ABNORMAL HIGH (ref 0.44–1.00)
GFR calc Af Amer: 35 mL/min — ABNORMAL LOW (ref >60)
GFR calc non Af Amer: 30 mL/min — ABNORMAL LOW (ref >60)
Glucose, Bld: 122 mg/dL — ABNORMAL HIGH (ref 70–99)
Potassium: 4.1 mmol/L (ref 3.5–5.1)
Sodium: 142 mmol/L (ref 135–145)
Total Bilirubin: 0.6 mg/dL (ref 0.3–1.2)
Total Protein: 7 g/dL (ref 6.5–8.1)

## 2018-07-21 LAB — DIFFERENTIAL
Abs Immature Granulocytes: 0.05 10*3/uL (ref 0.00–0.07)
Basophils Absolute: 0.1 10*3/uL (ref 0.0–0.1)
Basophils Relative: 1 %
Eosinophils Absolute: 0.2 10*3/uL (ref 0.0–0.5)
Eosinophils Relative: 1 %
Immature Granulocytes: 0 %
Lymphocytes Relative: 9 %
Lymphs Abs: 1.2 10*3/uL (ref 0.7–4.0)
Monocytes Absolute: 1.1 10*3/uL — ABNORMAL HIGH (ref 0.1–1.0)
Monocytes Relative: 8 %
Neutro Abs: 10.9 10*3/uL — ABNORMAL HIGH (ref 1.7–7.7)
Neutrophils Relative %: 81 %

## 2018-07-21 LAB — CBC
HCT: 36.8 % (ref 36.0–46.0)
Hemoglobin: 11.7 g/dL — ABNORMAL LOW (ref 12.0–15.0)
MCH: 30.5 pg (ref 26.0–34.0)
MCHC: 31.8 g/dL (ref 30.0–36.0)
MCV: 96.1 fL (ref 80.0–100.0)
Platelets: 183 10*3/uL (ref 150–400)
RBC: 3.83 MIL/uL — ABNORMAL LOW (ref 3.87–5.11)
RDW: 13.6 % (ref 11.5–15.5)
WBC: 13.6 10*3/uL — ABNORMAL HIGH (ref 4.0–10.5)
nRBC: 0 % (ref 0.0–0.2)

## 2018-07-21 LAB — MRSA PCR SCREENING: MRSA by PCR: NEGATIVE

## 2018-07-21 LAB — ETHANOL: Alcohol, Ethyl (B): <10 mg/dL (ref <10)

## 2018-07-21 LAB — APTT: aPTT: 25 seconds (ref 24–36)

## 2018-07-21 LAB — RAPID URINE DRUG SCREEN, HOSP PERFORMED
Amphetamines: NOT DETECTED
Barbiturates: NOT DETECTED
Benzodiazepines: NOT DETECTED
Cocaine: NOT DETECTED
Opiates: NOT DETECTED
Tetrahydrocannabinol: NOT DETECTED

## 2018-07-21 LAB — SARS CORONAVIRUS 2 BY RT PCR (HOSPITAL ORDER, PERFORMED IN ~~LOC~~ HOSPITAL LAB): SARS Coronavirus 2: NEGATIVE

## 2018-07-21 MED ORDER — ACETAMINOPHEN 160 MG/5ML PO SOLN: 650.0000 mg | ORAL | Status: DC | PRN

## 2018-07-21 MED ORDER — PANTOPRAZOLE SODIUM 40 MG PO TBEC
40.0000 mg | DELAYED_RELEASE_TABLET | Freq: Every day | ORAL | Status: DC
  Administered 2018-07-21 – 2018-07-25 (×5): 40 mg via ORAL
  Filled 2018-07-21 (×5): qty 1

## 2018-07-21 MED ORDER — LEVOTHYROXINE SODIUM 50 MCG PO TABS
50.0000 ug | ORAL_TABLET | Freq: Every day | ORAL | Status: DC
  Administered 2018-07-22 – 2018-07-25 (×4): 50 ug via ORAL
  Filled 2018-07-21 (×5): qty 1

## 2018-07-21 MED ORDER — CARBIDOPA-LEVODOPA 25-100 MG PO TABS
1.5000 | ORAL_TABLET | Freq: Four times a day (QID) | ORAL | Status: DC
  Administered 2018-07-21 – 2018-07-25 (×14): 1.5 via ORAL
  Filled 2018-07-21 (×14): qty 2

## 2018-07-21 MED ORDER — ACETAMINOPHEN 650 MG RE SUPP: 650.0000 mg | RECTAL | Status: DC | PRN

## 2018-07-21 MED ORDER — SODIUM CHLORIDE 0.9 % IV SOLN
INTRAVENOUS | Status: AC
  Administered 2018-07-21: 10:00:00 via INTRAVENOUS

## 2018-07-21 MED ORDER — SENNOSIDES-DOCUSATE SODIUM 8.6-50 MG PO TABS
2.0000 | ORAL_TABLET | Freq: Every day | ORAL | Status: DC
  Administered 2018-07-21 – 2018-07-24 (×4): 2 via ORAL
  Filled 2018-07-21 (×4): qty 2

## 2018-07-21 MED ORDER — MEMANTINE HCL 10 MG PO TABS
10.0000 mg | ORAL_TABLET | Freq: Two times a day (BID) | ORAL | Status: DC
  Administered 2018-07-21 – 2018-07-25 (×9): 10 mg via ORAL
  Filled 2018-07-21 (×10): qty 1

## 2018-07-21 MED ORDER — ACETAMINOPHEN 325 MG PO TABS
650.0000 mg | ORAL_TABLET | ORAL | Status: DC | PRN
  Administered 2018-07-22 – 2018-07-25 (×4): 650 mg via ORAL
  Filled 2018-07-21 (×4): qty 2

## 2018-07-21 MED ORDER — MORPHINE SULFATE (PF) 2 MG/ML IV SOLN
1.0000 mg | INTRAVENOUS | Status: DC | PRN
  Administered 2018-07-22 – 2018-07-23 (×2): 2 mg via INTRAVENOUS
  Administered 2018-07-24: 1 mg via INTRAVENOUS
  Filled 2018-07-21 (×4): qty 1

## 2018-07-21 MED ORDER — SENNOSIDES-DOCUSATE SODIUM 8.6-50 MG PO TABS: 1.0000 | ORAL_TABLET | Freq: Every day | ORAL | Status: DC

## 2018-07-21 MED ORDER — ASPIRIN 300 MG RE SUPP
300.0000 mg | Freq: Every day | RECTAL | Status: DC
  Administered 2018-07-22: 300 mg via RECTAL
  Filled 2018-07-21 (×2): qty 1

## 2018-07-21 MED ORDER — ASPIRIN EC 325 MG PO TBEC
325.0000 mg | DELAYED_RELEASE_TABLET | Freq: Every day | ORAL | Status: DC
  Administered 2018-07-21 – 2018-07-25 (×3): 325 mg via ORAL
  Filled 2018-07-21 (×4): qty 1

## 2018-07-21 MED ORDER — STROKE: EARLY STAGES OF RECOVERY BOOK
Freq: Once | Status: DC
  Filled 2018-07-21: qty 1

## 2018-07-21 MED ORDER — PERPHENAZINE 4 MG PO TABS
4.0000 mg | ORAL_TABLET | Freq: Two times a day (BID) | ORAL | Status: DC
  Administered 2018-07-21 – 2018-07-25 (×9): 4 mg via ORAL
  Filled 2018-07-21 (×10): qty 1

## 2018-07-21 MED ORDER — CLONAZEPAM 0.5 MG PO TABS
0.5000 mg | ORAL_TABLET | Freq: Three times a day (TID) | ORAL | Status: DC
  Administered 2018-07-21 – 2018-07-25 (×12): 0.5 mg via ORAL
  Filled 2018-07-21 (×12): qty 1

## 2018-07-21 MED ORDER — CARBIDOPA-LEVODOPA ER 25-100 MG PO TBCR
1.0000 | EXTENDED_RELEASE_TABLET | Freq: Every day | ORAL | Status: DC
  Administered 2018-07-22 – 2018-07-25 (×4): 1 via ORAL
  Filled 2018-07-21 (×5): qty 1

## 2018-07-21 MED ORDER — CITALOPRAM HYDROBROMIDE 10 MG PO TABS
30.0000 mg | ORAL_TABLET | Freq: Every day | ORAL | Status: DC
  Administered 2018-07-21 – 2018-07-23 (×3): 30 mg via ORAL
  Filled 2018-07-21 (×4): qty 3

## 2018-07-21 MED ORDER — TRIMETHOPRIM 100 MG PO TABS
100.0000 mg | ORAL_TABLET | Freq: Every day | ORAL | Status: DC
  Administered 2018-07-21 – 2018-07-25 (×5): 100 mg via ORAL
  Filled 2018-07-21 (×5): qty 1

## 2018-07-21 MED ORDER — FLUOROMETHOLONE 0.1 % OP SUSP
1.0000 [drp] | Freq: Every day | OPHTHALMIC | Status: DC
  Administered 2018-07-21 – 2018-07-25 (×5): 1 [drp] via OPHTHALMIC
  Filled 2018-07-21: qty 5

## 2018-07-21 NOTE — H&P (Signed)
History and Physical    Oluwateniola W Pitter YQM:[id number] DOB: Dec 18, 1929 DOA: 07/21/2018  PCP: Jethro SHANIK BROOKSHIREatient coming from: SNF   Chief Complaint: Unwitnessed fall, left shoulder pain   HPI: Deanna Page is a 85 y.o. female with medical history significant for Parkinson's disease, memory loss, chronic kidney disease stage III, depression, hypothyroidism, and recurrent UTI, now presenting to the emergency department after an unwitnessed fall.  Patient was found to be lying on the floor on her left side at the nursing facility after an unwitnessed fall.  She complained of pain involving her left shoulder, EMS was called, and she was transported to the ED for further evaluation. She denied pain in ED but is not moving the left arm.   ED Course: Upon arrival to the ED, patient is found to be afebrile, saturating adequately on room air, and with stable blood pressure.  EKG features a sinus rhythm with PVC and QTc interval of 502 ms.  Radiographs of the left shoulder demonstrate acute fracture of the left proximal humeral diaphysis with 1 shaft width medial displacement.  Noncontrast head CT is concerning for acute to subacute left PCA infarct.  Chemistry panel is notable for creatinine 1.53 and CBC features a leukocytosis to 13,600.  Orthopedic surgery was consulted by the ED physician and arm is been immobilized in a sling.  Neurology was also consulted by the ED physician and recommends medical admission for ongoing evaluation and management.  Review of Systems:  Unable to complete ROS secondary to the patient's clinical condition.  Past Medical History:  Diagnosis Date   Allergic rhinitis    Anxiety    Asthma    Bowel obstruction (HCC)    CKD (chronic kidney disease), stage III (HCC)    Dementia (HCC)    Diverticulosis    Esophageal dysmotility    Esophageal stricture 2005   GERD (gastroesophageal reflux disease)    Hepatitis A    Hiatal hernia     Hypertension    Hypothyroidism    Internal hemorrhoids    Irritable bowel syndrome    Ischemic colitis, enteritis, or enterocolitis (HCC) 2010   Major depression    with paranoia and insomnia   OSA (obstructive sleep apnea)    Parkinson disease (HCC)    Pneumonia    TIA (transient ischemic attack)    Urinary incontinence    Vitamin D deficiency     Past Surgical History:  Procedure Laterality Date   ABDOMINAL HYSTERECTOMY  1969   APPENDECTOMY  1969   BLADDER SURGERY     BREAST LUMPECTOMY  1995   CATARACT EXTRACTION     CHOLECYSTECTOMY  2000   CORNEAL TRANSPLANT Left    INTRAOCULAR PROSTHESES INSERTION     [facility] eye [facility]   TUBAL LIGATION  1968     reports that she has never smoked. She has never used smokeless tobacco. She reports that she does not drink alcohol or use drugs.  Allergies  Allergen Reactions   Erythromycin     Other reaction(s): Other (See Comments) Other Reaction: GI Upset Unknown reaction   Penicillins Rash and Shortness Of Breath    Unknown reaction Has patient had a PCN reaction causing immediate rash, facial/tongue/throat swelling, SOB or lightheadedness with hypotension: Unknown Has patient had a PCN reaction causing severe rash involving mucus membranes or skin necrosis: Unknown Has patient had a PCN reaction that required hospitalization: Unknown Has patient had a PCN reaction occurring within the  last 10 years: Unknown If all of the above answers are "NO", then may proceed with Cephalosporin use.    Aricept [Donepezil Hcl]     Unknown reaction   Clindamycin/Lincomycin Itching   Doxycycline     Unknown reaction   Pneumococcal Vaccines     Unknown reaction    Family History  Problem Relation Age of Onset   Stroke Mother    Parkinson's disease Mother    Lung cancer Father    ALS Sister    Liver disease Sister    Parkinson's disease Maternal Grandmother    Parkinson's disease Maternal Aunt     Emphysema Sister    Emphysema Brother    Colon cancer Neg Hx    Colon polyps Neg Hx    Diabetes Neg Hx    Gallbladder disease Neg Hx    Esophageal cancer Neg Hx      Prior to Admission medications   Medication Sig Start Date End Date Taking? Authorizing Provider  acetaminophen (TYLENOL) 325 MG tablet Take 650 mg by mouth 2 (two) times daily as needed for moderate pain.   Yes [provider]  alum & mag hydroxide-simeth (MAALOX/MYLANTA) 200-200-20 MG/5ML suspension Take 15 mLs by mouth 2 (two) times daily as needed for indigestion or heartburn.   Yes [provider]  antiseptic oral rinse (BIOTENE) LIQD 15 mLs by Mouth Rinse route 2 (two) times daily as needed for dry mouth.   Yes [provider]  atenolol (TENORMIN) 25 MG tablet Take 25 mg by mouth 2 (two) times daily.    Yes [provider]  carbidopa-levodopa (SINEMET IR) 25-100 MG tablet Take 1.5 tablets by mouth 4 (four) times daily.   Yes [provider]  Carbidopa-Levodopa ER (SINEMET CR) 25-100 MG tablet controlled release Take 1 tablet by mouth daily.    Yes [provider]  citalopram (CELEXA) 20 MG tablet Take 1 tablet (20 mg total) by mouth at bedtime. Patient taking differently: Take 30 mg by mouth daily.  10/09/15  Yes Huston FoleyAthar, Saima, MD  clonazePAM (KLONOPIN) 0.5 MG tablet Take 1 tablet (0.5 mg total) by mouth 3 (three) times daily. 05/27/18  Yes Sharee HolsterGreen, Deborah S, NP  fluorometholone (FML) 0.1 % ophthalmic suspension Place 1 drop into the left eye daily.   Yes [provider]  levothyroxine (SYNTHROID, LEVOTHROID) 50 MCG tablet Take 50 mcg by mouth daily before breakfast.   Yes [provider]  memantine (NAMENDA) 10 MG tablet Take 10 mg by mouth 2 (two) times daily.    Yes [provider]  Nutritional Supplements (ENSURE ORIGINAL) LIQD Take 8 oz by mouth 3 (three) times daily.    Yes [provider]  pantoprazole (PROTONIX) 20 MG tablet  Take 2 tablets (40 mg total) by mouth daily. 02/12/18  Yes Theotis BarrioLee, Joshua K, MD  perphenazine (TRILAFON) 4 MG tablet Take 4 mg by mouth 2 (two) times daily.   Yes [provider]  polyvinyl alcohol (LIQUIFILM TEARS) 1.4 % ophthalmic solution Place 1 drop into both eyes 4 (four) times daily as needed for dry eyes.    Yes [provider]  sennosides-docusate sodium (SENOKOT-S) 8.6-50 MG tablet Take 2 tablets by mouth at bedtime.   Yes [provider]  sodium fluoride (PREVIDENT 5000 PLUS) 1.1 % CREA dental cream Place 1 application onto teeth every evening.   Yes [provider]  trimethoprim (TRIMPEX) 100 MG tablet Take 100 mg by mouth daily.   Yes [provider]  Vitamin D, Cholecalciferol, 1000 units CAPS Take 1 Units by mouth daily.    Yes [provider]    Physical Exam: Vitals:   07/21/18 0207 07/21/18 0209 07/21/18 0215 07/21/18 0447  BP:    (!) 131/111  Pulse:    96  Resp:    17  Temp:  99 F (37.2 C) 100 F (37.8 C)   TempSrc:  Oral Rectal   SpO2:    96%  Weight: 53.1 kg     Height: 5\' 3"  (1.6 m)       Constitutional: NAD, calm  Eyes: PERTLA, lids and conjunctivae normal ENMT: Mucous membranes are moist. Posterior pharynx clear of any exudate or lesions.   Neck: normal, supple, no masses, no thyromegaly Respiratory: no wheezing, no crackles.  No accessory muscle use.  Cardiovascular: S1 & S2 heard, regular rate and rhythm. No extremity edema.  Abdomen: No distension, no tenderness, soft. Bowel sounds active.  Musculoskeletal: no clubbing / cyanosis. Left shoulder ecchymosis and proximal left arm tenderness, neurovascularly intact distally.    Skin: no significant rashes, lesions, ulcers. Warm, dry, well-perfused. Neurologic: No gross facial asymmetry. Tremulous. Sensation intact. Not moving left arm. Confused, has difficulty following commands.    Labs on Admission: I have personally reviewed following labs and imaging  studies  CBC: Recent Labs  Lab 07/21/18 0405  WBC 13.6*  NEUTROABS 10.9*  HGB 11.7*  HCT 36.8  MCV 96.1  PLT 183   Basic Metabolic Panel: Recent Labs  Lab 07/21/18 0405  NA 142  K 4.1  CL 106  CO2 22  GLUCOSE 122*  BUN 31*  CREATININE 1.53*  CALCIUM 9.8   GFR: Estimated Creatinine Clearance: 21.8 mL/min (A) (by C-G formula based on SCr of 1.53 mg/dL (H)). Liver Function Tests: Recent Labs  Lab 07/21/18 0405  AST 26  ALT 7  ALKPHOS 80  BILITOT 0.6  PROT 7.0  ALBUMIN 3.8   No results for input(s): LIPASE, AMYLASE in the last 168 hours. No results for input(s): AMMONIA in the last 168 hours. Coagulation Profile: Recent Labs  Lab 07/21/18 0430  INR 1.0   Cardiac Enzymes: No results for input(s): CKTOTAL, CKMB, CKMBINDEX, TROPONINI in the last 168 hours. BNP (last 3 results) No results for input(s): PROBNP in the last 8760 hours. HbA1C: No results for input(s): HGBA1C in the last 72 hours. CBG: No results for input(s): GLUCAP in the last 168 hours. Lipid Profile: No results for input(s): CHOL, HDL, LDLCALC, TRIG, CHOLHDL, LDLDIRECT in the last 72 hours. Thyroid Function Tests: No results for input(s): TSH, T4TOTAL, FREET4, T3FREE, THYROIDAB in the last 72 hours. Anemia Panel: No results for input(s): VITAMINB12, FOLATE, FERRITIN, TIBC, IRON, RETICCTPCT in the last 72 hours. Urine analysis:    Component Value Date/Time   COLORURINE YELLOW 07/21/2018 0405   APPEARANCEUR HAZY (A) 07/21/2018 0405   LABSPEC 1.017 07/21/2018 0405   PHURINE 5.0 07/21/2018 0405   GLUCOSEU NEGATIVE 07/21/2018 0405   HGBUR NEGATIVE 07/21/2018 0405   BILIRUBINUR NEGATIVE 07/21/2018 0405   KETONESUR 5 (A) 07/21/2018 0405   PROTEINUR NEGATIVE 07/21/2018 0405   UROBILINOGEN 0.2 08/02/2009 0032   NITRITE NEGATIVE 07/21/2018 0405   LEUKOCYTESUR SMALL (A) 07/21/2018 0405   Sepsis Labs: @LABRCNTIP (procalcitonin:4,lacticidven:4) )No results found for this or any previous visit  (from the past 240 hour(s)).   Radiological Exams on Admission: Ct Head Wo Contrast  Result Date: 07/21/2018 CLINICAL DATA:  85 year old female unwitnessed fall. EXAM: CT HEAD WITHOUT CONTRAST CT CERVICAL  SPINE WITHOUT CONTRAST TECHNIQUE: Multidetector CT imaging of the head and cervical spine was performed following the standard protocol without intravenous contrast. Multiplanar CT image reconstructions of the cervical spine were also generated. COMPARISON:  Head CT 12/30/2016 and earlier. FINDINGS: CT HEAD FINDINGS Brain: Confluent hypodensity in the left occipital lobe affecting gray and white matter most resembles an acute to subacute infarct (series 3, image 16). Possible mild petechial hemorrhage but no malignant hemorrhagic transformation. No significant mass effect. Stable gray-white matter differentiation elsewhere with confluent white matter hypodensity. No other intracranial blood products. Vascular: Calcified atherosclerosis at the skull base. No suspicious intracranial vascular hyperdensity. Skull: No acute fracture identified. Sinuses/Orbits: New right mastoid effusion since 2018, sparing the mastoid antrum. The right tympanic cavity remains clear. The left tympanic cavity remains clear but there is also mild new left mastoid fluid. Visible paranasal sinuses are stable in clear. Other: No scalp hematoma identified. Negative orbits. CT CERVICAL SPINE FINDINGS Alignment: Chronic straightening of cervical lordosis. Cervicothoracic junction alignment is within normal limits. Bilateral posterior element alignment is within normal limits. Skull base and vertebrae: Stable skull base and craniocervical junction alignment but severe chronic left C1-occipital condyle degeneration. 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 Superimposed congenital incomplete segmentation of C2-C3, and ankylosis of the left C3-C4 posterior elements. Superimposed right side C1-C2 joint degeneration also. No  acute osseous abnormality identified. Soft tissues and spinal canal: No prevertebral fluid or swelling. No visible canal hematoma. Disc levels: Advanced degenerative change from the skull base to C2 as stated above. Incomplete segmentation of C2-C3 as stated above as well as left C3-C4 and also C5-C6 posterior element ankylosis. Upper chest: Visible upper thoracic levels appear grossly intact. Negative visible lung apices. Other: There is a proximal left humerus fracture visible on the scout view. IMPRESSION: 1. Acute to subacute appearing Left PCA territory infarct. Possible associated Petechial Hemorrhage but no malignant hemorrhagic transformation. No intracranial mass effect. 2. No superimposed acute traumatic injury identified in the head or cervical spine. Proximal left humerus fracture is visible on the scout view. 3. 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 Electronically Signed   By: Odessa Fleming M.D.   On: 07/21/2018 03:53   Ct Cervical Spine Wo Contrast  Result Date: 07/21/2018 CLINICAL DATA:  85 year old female unwitnessed fall. EXAM: CT HEAD WITHOUT CONTRAST CT CERVICAL SPINE WITHOUT CONTRAST TECHNIQUE: Multidetector CT imaging of the head and cervical spine was performed following the standard protocol without intravenous contrast. Multiplanar CT image reconstructions of the cervical spine were also generated. COMPARISON:  Head CT 12/30/2016 and earlier. FINDINGS: CT HEAD FINDINGS Brain: Confluent hypodensity in the left occipital lobe affecting gray and white matter most resembles an acute to subacute infarct (series 3, image 16). Possible mild petechial hemorrhage but no malignant hemorrhagic transformation. No significant mass effect. Stable gray-white matter differentiation elsewhere with confluent white matter hypodensity. No other intracranial blood products. Vascular: Calcified atherosclerosis at the skull base. No suspicious  intracranial vascular hyperdensity. Skull: No acute fracture identified. Sinuses/Orbits: New right mastoid effusion since 2018, sparing the mastoid antrum. The right tympanic cavity remains clear. The left tympanic cavity remains clear but there is also mild new left mastoid fluid. Visible paranasal sinuses are stable in clear. Other: No scalp hematoma identified. Negative orbits. CT CERVICAL SPINE FINDINGS Alignment: Chronic straightening of cervical lordosis. Cervicothoracic junction alignment is within normal limits. Bilateral posterior element alignment is within normal limits. Skull base and vertebrae: Stable skull base and  craniocervical junction alignment but severe chronic left C1-occipital condyle degeneration. 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 Superimposed congenital incomplete segmentation of C2-C3, and ankylosis of the left C3-C4 posterior elements. Superimposed right side C1-C2 joint degeneration also. No acute osseous abnormality identified. Soft tissues and spinal canal: No prevertebral fluid or swelling. No visible canal hematoma. Disc levels: Advanced degenerative change from the skull base to C2 as stated above. Incomplete segmentation of C2-C3 as stated above as well as left C3-C4 and also C5-C6 posterior element ankylosis. Upper chest: Visible upper thoracic levels appear grossly intact. Negative visible lung apices. Other: There is a proximal left humerus fracture visible on the scout view. IMPRESSION: 1. Acute to subacute appearing Left PCA territory infarct. Possible associated Petechial Hemorrhage but no malignant hemorrhagic transformation. No intracranial mass effect. 2. No superimposed acute traumatic injury identified in the head or cervical spine. Proximal left humerus fracture is visible on the scout view. 3. 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 Electronically Signed   By: Odessa Fleming M.D.    On: 07/21/2018 03:53   Dg Shoulder Left  Result Date: 07/21/2018 CLINICAL DATA:  85 y/o  F; unwitnessed fall. Left shoulder pain. EXAM: LEFT SHOULDER - 2+ VIEW COMPARISON:  06/20/2016 left shoulder MRI FINDINGS: Acute fracture of the left proximal humeral diaphysis with 1 shaft's width medial displacement of the shaft, 3 cm overriding. No shoulder joint dislocation. IMPRESSION: Acute fracture of the left proximal humeral diaphysis with 1 shaft's width medial displacement of the shaft, 3 cm overriding. No dislocation. Electronically Signed   By: Mitzi Hansen M.D.   On: 07/21/2018 03:07    EKG: Independently reviewed. Sinus rhythm, PVC, significant artifact, QTc 502 ms.   Assessment/Plan   1. Ischemic left PCA stroke   - Presents after an unwitnessed fall, found to be confused and head CT reveals acute-to-subacute left PCA infarction  - Neurology is consulting and much appreciated  - Check MRI brain, MRA head, carotid US, echocardiogram, fasting lipids, A1c  - Continue cardiac monitoring and neuro checks, consult with PT/OT/SLP   2. Left humerus fracture  - Presents after unwitnessed fall and found to have acute fracture of proximal left humeral diaphysis with 1 shaft-width medial displacement  - Orthopedic surgery is consulting and arm is being immobilized in sling  - Keep NPO and hold ASA pending surgeon's evaluation, continue supportive care    3. CKD stage III  - SCr is 1.53 on admission, up from priors in 1.2-1.3 range  - Renally-dose medications, continue IVF hydration while NPO    4. Parkinson's disease  - Continue Sinemet   5. Hypothyroidism   - Continue Synthroid    6. Depression, anxiety, dementia - Continue Celexa, perphenazine, Klonopin, and Namenda     PPE: Mask, face shield  DVT prophylaxis: SCD's  Code Status: Full  Family Communication: Discussed with patient  Consults called: Neurology, orthopedic surgery  Admission status: Observation      Briscoe Deutscher, MD [facility] Pager [phone]  If 7PM-7AM, please contact night-coverage [email or web address] Password [email or web address]  07/21/2018, 5:10 AM

## 2018-07-21 NOTE — ED Provider Notes (Signed)
[facility] EMERGENCY DEPARTMENT Provider Note   CSN: [id number] Arrival date & time: 07/21/18  0159    History   Chief Complaint Chief Complaint  Patient presents with   Fall    HPI Deanna Page is a 85 y.o. female.   The history is provided by the patient and the nursing home. The history is limited by the condition of the patient (Dementia).  She has history of hypertension, Parkinson's disease, dementia, chronic kidney disease, transient ischemic attack and comes in following an unwitnessed fall at the skilled nursing facility where she lives.  She apparently had been complaining of pain in her left shoulder and in her neck, but she has no complaints on my questioning.  However, she is very confused.  Past Medical History:  Diagnosis Date   Allergic rhinitis    Anxiety    Asthma    Bowel obstruction (HCC)    CKD (chronic kidney disease), stage III (HCC)    Dementia (HCC)    Diverticulosis    Esophageal dysmotility    Esophageal stricture 2005   GERD (gastroesophageal reflux disease)    Hepatitis A    Hiatal hernia    Hypertension    Hypothyroidism    Internal hemorrhoids    Irritable bowel syndrome    Ischemic colitis, enteritis, or enterocolitis (HCC) 2010   Major depression    with paranoia and insomnia   OSA (obstructive sleep apnea)    Parkinson disease (HCC)    Pneumonia    TIA (transient ischemic attack)    Urinary incontinence    Vitamin D deficiency     Patient Active Problem List   Diagnosis Date Noted   Fever in adult    Chest pain in adult 02/11/2018   Suicidal ideation    Altered mental status 12/30/2016   UTI (urinary tract infection) 12/30/2016   Dementia (HCC) 12/30/2016   Parkinson's disease (HCC) 03/01/2013   GASTRITIS 02/22/2010   ABDOMINAL PAIN-EPIGASTRIC 02/22/2010   DIVERTICULOSIS-COLON 12/19/2008   OTHER DYSPHAGIA 10/07/2007   TIA 01/21/2007   DIARRHEA 01/21/2007    GAIT DISTURBANCE 01/01/2007   GASTROESOPHAGEAL REFLUX DISEASE 12/20/2006   HYSTERECTOMY, HX OF 12/20/2006   APPENDECTOMY, HX OF 12/20/2006   SYNCOPE 10/01/2006   SYMPTOM, MEMORY LOSS 09/16/2006   BRONCHITIS, ACUTE 06/30/2006   DEPRESSION 06/02/2006   HYPERTENSION 06/02/2006   ALLERGIC RHINITIS 06/02/2006   ASTHMA 06/02/2006   PROLAPSE, VAGINAL WALL, RECTOCELE 06/02/2006   DIZZINESS OR VERTIGO 06/02/2006   URINARY INCONTINENCE 06/02/2006   PNEUMONIA, HX OF 06/02/2006   BLADDER REPAIR, HX OF 06/02/2006   INSOMNIA, HX OF 06/02/2006    Past Surgical History:  Procedure Laterality Date   ABDOMINAL HYSTERECTOMY  1969   APPENDECTOMY  1969   BLADDER SURGERY     BREAST LUMPECTOMY  1995   CATARACT EXTRACTION     CHOLECYSTECTOMY  2000   CORNEAL TRANSPLANT Left    INTRAOCULAR PROSTHESES INSERTION     Left eye at [facility]   TUBAL LIGATION  1968     OB History   No obstetric history on file.      Home Medications    Prior to Admission medications   Medication Sig Start Date End Date Taking? Authorizing Provider  acetaminophen (TYLENOL) 325 MG tablet Take 650 mg by mouth 2 (two) times daily as needed for moderate pain.    [provider]  alum & mag hydroxide-simeth (MAALOX/MYLANTA) 200-200-20 MG/5ML suspension Take 15 mLs by mouth 2 (two)  times daily as needed for indigestion or heartburn.    [provider]  atenolol (TENORMIN) 25 MG tablet Take 25 mg by mouth 2 (two) times daily.     [provider]  carbidopa-levodopa (SINEMET IR) 25-100 MG tablet Take 1.5 tablets by mouth 4 (four) times daily.    [provider]  Carbidopa-Levodopa ER (SINEMET CR) 25-100 MG tablet controlled release Take 1 tablet by mouth every morning.    [provider]  citalopram (CELEXA) 20 MG tablet Take 1 tablet (20 mg total) by mouth at bedtime. Patient taking differently: Take 30 mg by mouth at bedtime.  10/09/15   Huston Foley, MD    clonazePAM (KLONOPIN) 0.5 MG tablet Take 1 tablet (0.5 mg total) by mouth 3 (three) times daily. 05/27/18   Sharee Holster, NP  fluorometholone (FML) 0.1 % ophthalmic suspension Place 1 drop into the left eye daily.    [provider]  levofloxacin (LEVAQUIN) 250 MG tablet Take 1 tablet (250 mg total) by mouth daily. 03/11/18   Starkes-Perry, Juel Burrow, FNP  levothyroxine (SYNTHROID, LEVOTHROID) 50 MCG tablet Take 50 mcg by mouth daily before breakfast.    [provider]  memantine (NAMENDA) 10 MG tablet Take 10 mg by mouth 2 (two) times daily.     [provider]  Nutritional Supplements (ENSURE ORIGINAL) LIQD Take 8 oz by mouth 2 (two) times daily.     [provider]  pantoprazole (PROTONIX) 20 MG tablet Take 2 tablets (40 mg total) by mouth daily. 02/12/18   Theotis Barrio, MD  perphenazine (TRILAFON) 4 MG tablet Take 4 mg by mouth 2 (two) times daily.    [provider]  phenazopyridine (PYRIDIUM) 200 MG tablet Take 1 tablet (200 mg total) by mouth 3 (three) times daily with meals. 03/10/18   Starkes-Perry, Juel Burrow, FNP  polyvinyl alcohol (LIQUIFILM TEARS) 1.4 % ophthalmic solution Place 1 drop into both eyes 4 (four) times daily as needed for dry eyes.     [provider]  sennosides-docusate sodium (SENOKOT-S) 8.6-50 MG tablet Take 2 tablets by mouth at bedtime.    [provider]  sodium chloride (MURO 128) 5 % ophthalmic ointment Place 1 application into the right eye at bedtime. For hx of Corneal transplant    [provider]  sodium fluoride (PREVIDENT 5000 PLUS) 1.1 % CREA dental cream Place 1 application onto teeth every evening.    [provider]  triazolam (HALCION) 0.25 MG tablet Take 0.125 mg by mouth at bedtime.     [provider]  trimethoprim (TRIMPEX) 100 MG tablet Take 100 mg by mouth daily.    [provider]  Vitamin D, Cholecalciferol, 1000 units CAPS Take 1 capsule by mouth  daily.    [provider]    Family History Family History  Problem Relation Age of Onset   Stroke Mother    Parkinson's disease Mother    Lung cancer Father    ALS Sister    Liver disease Sister    Parkinson's disease Maternal Grandmother    Parkinson's disease Maternal Aunt    Emphysema Sister    Emphysema Brother    Colon cancer Neg Hx    Colon polyps Neg Hx    Diabetes Neg Hx    Gallbladder disease Neg Hx    Esophageal cancer Neg Hx     Social History Social History   Tobacco Use   Smoking status: Never Smoker   Smokeless  tobacco: Never Used  Substance Use Topics   Alcohol use: No    Alcohol/week: 0.0 standard drinks   Drug use: No     Allergies   Erythromycin; Penicillins; Aricept [donepezil hcl]; Clindamycin/lincomycin; Doxycycline; and Pneumococcal vaccines   Review of Systems Review of Systems  Unable to perform ROS: Dementia     Physical Exam Updated Vital Signs BP (!) 131/111 (BP Location: Right Arm)    Pulse 96    Temp 100 F (37.8 C) (Rectal)    Resp 17    Ht  (1.6 m) Comment: per facility ppwk   Wt 53.1 kg Comment: per facility ppwk   SpO2 96%    BMI 20.73 kg/m   Physical Exam Vitals signs and nursing note reviewed.    85 year old female, resting comfortably and in no acute distress. Vital signs are significant for elevated blood pressure. Oxygen saturation is 96%, which is normal. Head is normocephalic and atraumatic. PERRLA, EOMI. Oropharynx is clear. Neck is immobilized in a stiff cervical collar and is nontender without adenopathy or JVD. Back is nontender and there is no CVA tenderness. Lungs are clear without rales, wheezes, or rhonchi. Chest is nontender. Heart has regular rate and rhythm without murmur. Abdomen is soft, flat, nontender without masses or hepatosplenomegaly and peristalsis is normoactive. Extremities have no cyanosis or edema, full range of motion is present. Skin is warm and dry  without rash. Neurologic: Awake and alert, oriented to person but not place or time, cranial nerves are intact she moves both legs and her right arm spontaneously, but does not move her left arm.  Moderate resting tremors present.  ED Treatments / Results  Labs (all labs ordered are listed, but only abnormal results are displayed) Labs Reviewed  CBC - Abnormal; Notable for the following components:      Result Value   WBC 13.6 (*)    RBC 3.83 (*)    Hemoglobin 11.7 (*)    All other components within normal limits  DIFFERENTIAL - Abnormal; Notable for the following components:   Neutro Abs 10.9 (*)    Monocytes Absolute 1.1 (*)    All other components within normal limits  COMPREHENSIVE METABOLIC PANEL - Abnormal; Notable for the following components:   Glucose, Bld 122 (*)    BUN 31 (*)    Creatinine, Ser 1.53 (*)    GFR calc non Af Amer 30 (*)    GFR calc Af Amer 35 (*)    All other components within normal limits  URINALYSIS, ROUTINE W REFLEX MICROSCOPIC - Abnormal; Notable for the following components:   APPearance HAZY (*)    Ketones, ur 5 (*)    Leukocytes,Ua SMALL (*)    WBC, UA >50 (*)    All other components within normal limits  ETHANOL  RAPID URINE DRUG SCREEN, HOSP PERFORMED  PROTIME-INR  APTT    EKG EKG Interpretation  Date/Time:  Tuesday Jul 21 2018 02:13:24 EDT Ventricular Rate:  121 PR Interval:    QRS Duration: 103 QT Interval:  443 QTC Calculation: 502 R Axis:   36 Text Interpretation:  Sinus rhythm Prolonged QT interval Artifact When compared with ECG of 02/11/2018, QT has lengthened Reconfirmed by Dione Booze ([id number]) on 07/21/2018 4:59:07 AM   Radiology Ct Head Wo Contrast  Result Date: 07/21/2018 CLINICAL DATA:  85 year old female unwitnessed fall. EXAM: CT HEAD WITHOUT CONTRAST CT CERVICAL SPINE WITHOUT CONTRAST TECHNIQUE: Multidetector CT imaging of the head and cervical spine was performed  following the standard protocol without intravenous  contrast. Multiplanar CT image reconstructions of the cervical spine were also generated. COMPARISON:  Head CT 12/30/2016 and earlier. FINDINGS: CT HEAD FINDINGS Brain: Confluent hypodensity in the left occipital lobe affecting gray and white matter most resembles an acute to subacute infarct (series 3, image 16). Possible mild petechial hemorrhage but no malignant hemorrhagic transformation. No significant mass effect. Stable gray-white matter differentiation elsewhere with confluent white matter hypodensity. No other intracranial blood products. Vascular: Calcified atherosclerosis at the skull base. No suspicious intracranial vascular hyperdensity. Skull: No acute fracture identified. Sinuses/Orbits: New right mastoid effusion since 2018, sparing the mastoid antrum. The right tympanic cavity remains clear. The left tympanic cavity remains clear but there is also mild new left mastoid fluid. Visible paranasal sinuses are stable in clear. Other: No scalp hematoma identified. Negative orbits. CT CERVICAL SPINE FINDINGS Alignment: Chronic straightening of cervical lordosis. Cervicothoracic junction alignment is within normal limits. Bilateral posterior element alignment is within normal limits. Skull base and vertebrae: Stable skull base and craniocervical junction alignment but severe chronic left C1-occipital condyle degeneration. 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 Superimposed congenital incomplete segmentation of C2-C3, and ankylosis of the left C3-C4 posterior elements. Superimposed right side C1-C2 joint degeneration also. No acute osseous abnormality identified. Soft tissues and spinal canal: No prevertebral fluid or swelling. No visible canal hematoma. Disc levels: Advanced degenerative change from the skull base to C2 as stated above. Incomplete segmentation of C2-C3 as stated above as well as left C3-C4 and also C5-C6 posterior element ankylosis. Upper chest: Visible upper thoracic  levels appear grossly intact. Negative visible lung apices. Other: There is a proximal left humerus fracture visible on the scout view. IMPRESSION: 1. Acute to subacute appearing Left PCA territory infarct. Possible associated Petechial Hemorrhage but no malignant hemorrhagic transformation. No intracranial mass effect. 2. No superimposed acute traumatic injury identified in the head or cervical spine. Proximal left humerus fracture is visible on the scout view. 3. 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 Electronically Signed   By: Odessa Fleming M.D.   On: 07/21/2018 03:53   Ct Cervical Spine Wo Contrast  Result Date: 07/21/2018 CLINICAL DATA:  85 year old female unwitnessed fall. EXAM: CT HEAD WITHOUT CONTRAST CT CERVICAL SPINE WITHOUT CONTRAST TECHNIQUE: Multidetector CT imaging of the head and cervical spine was performed following the standard protocol without intravenous contrast. Multiplanar CT image reconstructions of the cervical spine were also generated. COMPARISON:  Head CT 12/30/2016 and earlier. FINDINGS: CT HEAD FINDINGS Brain: Confluent hypodensity in the left occipital lobe affecting gray and white matter most resembles an acute to subacute infarct (series 3, image 16). Possible mild petechial hemorrhage but no malignant hemorrhagic transformation. No significant mass effect. Stable gray-white matter differentiation elsewhere with confluent white matter hypodensity. No other intracranial blood products. Vascular: Calcified atherosclerosis at the skull base. No suspicious intracranial vascular hyperdensity. Skull: No acute fracture identified. Sinuses/Orbits: New right mastoid effusion since 2018, sparing the mastoid antrum. The right tympanic cavity remains clear. The left tympanic cavity remains clear but there is also mild new left mastoid fluid. Visible paranasal sinuses are stable in clear. Other: No scalp hematoma identified.  Negative orbits. CT CERVICAL SPINE FINDINGS Alignment: Chronic straightening of cervical lordosis. Cervicothoracic junction alignment is within normal limits. Bilateral posterior element alignment is within normal limits. Skull base and vertebrae: Stable skull base and craniocervical junction alignment but severe chronic left C1-occipital condyle degeneration. Congenital incomplete ossification of  the posterior C1 arch. Superimposed congenital incomplete segmentation of C2-C3, and ankylosis of the left C3-C4 posterior elements. Superimposed right side C1-C2 joint degeneration also. No acute osseous abnormality identified. Soft tissues and spinal canal: No prevertebral fluid or swelling. No visible canal hematoma. Disc levels: Advanced degenerative change from the skull base to C2 as stated above. Incomplete segmentation of C2-C3 as stated above as well as left C3-C4 and also C5-C6 posterior element ankylosis. Upper chest: Visible upper thoracic levels appear grossly intact. Negative visible lung apices. Other: There is a proximal left humerus fracture visible on the scout view. IMPRESSION: 1. Acute to subacute appearing Left PCA territory infarct. Possible associated Petechial Hemorrhage but no malignant hemorrhagic transformation. No intracranial mass effect. 2. No superimposed acute traumatic injury identified in the head or cervical spine. Proximal left humerus fracture is visible on the scout view. 3. 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 Electronically Signed   By: Odessa Fleming M.D.   On: 07/21/2018 03:53   Dg Shoulder Left  Result Date: 07/21/2018 CLINICAL DATA:  85 y/o  F; unwitnessed fall. Left shoulder pain. EXAM: LEFT SHOULDER - 2+ VIEW COMPARISON:  06/20/2016 left shoulder MRI FINDINGS: Acute fracture of the left proximal humeral diaphysis with 1 shaft's width medial displacement of the shaft, 3 cm overriding. No shoulder joint  dislocation. IMPRESSION: Acute fracture of the left proximal humeral diaphysis with 1 shaft's width medial displacement of the shaft, 3 cm overriding. No dislocation. Electronically Signed   By: Mitzi Hansen M.D.   On: 07/21/2018 03:07    Procedures Procedures  CRITICAL CARE Performed by: Dione Booze Total critical care time: 40 minutes Critical care time was exclusive of separately billable procedures and treating other patients. Critical care was necessary to treat or prevent imminent or life-threatening deterioration. Critical care was time spent personally by me on the following activities: development of treatment plan with patient and/or surrogate as well as nursing, discussions with consultants, evaluation of patient's response to treatment, examination of patient, obtaining history from patient or surrogate, ordering and performing treatments and interventions, ordering and review of laboratory studies, ordering and review of radiographic studies, pulse oximetry and re-evaluation of patient's condition.   Medications Ordered in ED Medications - No data to display   Initial Impression / Assessment and Plan / ED Course  I have reviewed the triage vital signs and the nursing notes.  Pertinent imaging results that were available during my care of the patient were reviewed by me and considered in my medical decision making (see chart for details).  Unwitnessed fall without any obvious injury.  Will send for x-rays of left shoulder and CT of head and cervical spine.  Old records are reviewed, and she does have ED visits for dementia and mental status change, but no prior ED visits for falls.  CT shows evidence of recent left posterior inferior cerebellar artery infarct read as acute or subacute.  X-rays of the left shoulder showed fracture of the proximal left humerus.  This may account for her not moving her left arm which would not be affected by a left posterior inferior  cerebellar artery stroke.  Screening labs were obtained showing renal insufficiency which is slightly worse from baseline, and stable anemia.  Her left arm was placed in a sling.  Case has been discussed with Dr. Marcello Fennel of orthopedics who will see her in consultation.  Case also discussed with Dr. Laurence Slate of neurology service who agrees to  see the patient in consultation.  Case is discussed with Dr. Antionette Charpyd of [facility] hospital[facility] who agrees to admit the patient.  Final Clinical Impressions(s) / ED Diagnoses   Final diagnoses:  Cerebrovascular accident (CVA) due to occlusion of left posterior cerebral artery (HCC)  Fall at nursing home, initial encounter  Other closed displaced fracture of proximal end of left humerus, initial encounter  Renal insufficiency  Normochromic normocytic anemia    ED Discharge Orders    None       Dione BoozeGlick, Braniya Farrugia, MD 07/21/18 0502

## 2018-07-21 NOTE — Progress Notes (Signed)
[facility] lower extremity venous duplex completed. Results in Chart review CV Proc. [place] Kedron Uno,RVS 07/21/2018 10;30 am

## 2018-07-21 NOTE — Evaluation (Signed)
Physical Therapy Evaluation Patient Details Name: Deanna Page MRN: [id number] DOB: [birth year] Today's Date: 07/21/2018   History of Present Illness  Pt is an 85 y.o. female [facility] on 07/21/18 after an unwitnessed fall with AMS and c/o L shoulder pain. Workup revealed L proximal humerus fx. 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 PMH includes Parkinson's disease, memory loss, CKD III, depression.    Clinical Impression  Pt presents with an overall decrease in functional mobility secondary to above. PTA, [facility]. Pt seems pleasantly confused, but able to state name and wanting water. Pt currently requiring minA to maxA+2 for mobility. LUE sling applied for comfort. Pt would benefit from continued acute PT services to maximize functional mobility and independence prior to d/c with SNF-level therapies.     Follow Up Recommendations SNF;Supervision/Assistance - 24 hour([facility])    Geophysical data processorquipment Recommendations  Wheelchair (measurements PT)    Recommendations for Other Services       Precautions / Restrictions Precautions Precautions: Fall;Shoulder Precaution Comments: L UE sling required for comfort Required Braces or Orthoses: Sling Restrictions Weight Bearing Restrictions: Yes LUE Weight Bearing: Non weight bearing      Mobility  Bed Mobility Overal bed mobility: Needs Assistance Bed Mobility: Supine to Sit     Supine to sit: Min assist     General bed mobility comments: pt initiated and activated core to sit up on EOB  Transfers Overall transfer level: Needs assistance Equipment used: 1 person hand held assist Transfers: Sit to/from Stand Sit to Stand: Mod assist         General transfer comment: Reliant on RUE support and modA to steady  Ambulation/Gait Ambulation/Gait assistance: Min assist;Max assist;+2 physical assistance Gait Distance (Feet): 30 Feet Assistive device: 1 person hand held  assist Gait Pattern/deviations: Step-to pattern;Trunk flexed;Leaning posteriorly     General Gait Details: Initial ambulation to bathroom with HHA and minA for balance; required mod-maxA+2 when ambulating to recliner, significant forward flexion and posterior lean requiring maxA to maintain balance, pt not following directions to correct posture  Stairs            Wheelchair Mobility    Modified Rankin (Stroke Patients Only)       Balance Overall balance assessment: Needs assistance Sitting-balance support: Single extremity supported;Feet supported Sitting balance-Leahy Scale: Fair     Standing balance support: Single extremity supported;During functional activity Standing balance-Leahy Scale: Poor                               Pertinent Vitals/Pain Pain Assessment: Faces Faces Pain Scale: Hurts little more Pain Location: generalized with movement Pain Descriptors / Indicators: Grimacing Pain Intervention(s): Limited activity within patient's tolerance;Repositioned;Ice applied    Home Living Family/patient expects to be discharged to:: Skilled nursing facility                 Additional Comments: Per chart[facility]    Prior Function Level of Independence: Needs assistance   Gait / Transfers Assistance Needed: Pt poor historian. Reports she walks with RW and SPC  ADL's / Homemaking Assistance Needed: Unknown level of assist needed; per chart, h/o dementia  Comments: unknown level of (A) but has hx of dementia charted     Hand Dominance   Dominant Hand: Right    Extremity/Trunk Assessment   Upper Extremity Assessment Upper Extremity Assessment: LUE deficits/detail;Generalized  weakness LUE Deficits / Details: L humerus fx; pt grimacing with sling placement    Lower Extremity Assessment Lower Extremity Assessment: Generalized weakness    Cervical / Trunk Assessment Cervical / Trunk Assessment: Kyphotic  Communication    Communication: Other (comment)(delayed responses but able to state name)  Cognition Arousal/Alertness: Awake/alert Behavior During Therapy: Restless Overall Cognitive Status: History of cognitive impairments - at baseline                                 General Comments: Able to state first name and that she feel out of bed. Performing automatic movements appropriately, but not consistently following simple commands. Seems pleasantly confused      General Comments      Exercises     Assessment/Plan    PT Assessment Patient needs continued PT services  PT Problem List Decreased strength;Decreased range of motion;Decreased activity tolerance;Decreased balance;Decreased mobility;Decreased cognition;Decreased knowledge of use of DME;Decreased knowledge of precautions;Pain       PT Treatment Interventions DME instruction;Gait training;Functional mobility training;Therapeutic activities;Therapeutic exercise;Balance training;Patient/family education;Wheelchair mobility training    PT Goals (Current goals can be found in the Care Plan section)  Acute Rehab PT Goals Patient Stated Goal: sip of water PT Goal Formulation: With patient Time For Goal Achievement: 08/04/18 Potential to Achieve Goals: Fair    Frequency Min 2X/week   Barriers to discharge        Co-evaluation PT/OT/SLP Co-Evaluation/Treatment: Yes Reason for Co-Treatment: For patient/therapist safety;To address functional/ADL transfers PT goals addressed during session: Mobility/safety with mobility;Balance OT goals addressed during session: ADL's and self-care;Proper use of Adaptive equipment and DME;Strengthening/ROM       AM-PAC PT "6 Clicks" Mobility  Outcome Measure Help needed turning from your back to your side while in a flat bed without using bedrails?: A Little Help needed moving from lying on your back to sitting on the side of a flat bed without using bedrails?: A Little Help needed moving  to and from a bed to a chair (including a wheelchair)?: A Little Help needed standing up from a chair using your arms (e.g., wheelchair or bedside chair)?: A Lot Help needed to walk in hospital room?: A Lot Help needed climbing 3-5 steps with a railing? : A Lot 6 Click Score: 15    End of Session Equipment Utilized During Treatment: Other (comment)(LUE sling) Activity Tolerance: Patient tolerated treatment well Patient left: in chair;with call bell/phone within reach;with nursing/sitter in room;with chair alarm set Nurse Communication: Mobility status PT Visit Diagnosis: Other abnormalities of gait and mobility (R26.89)    Time: 0962-8366 PT Time Calculation (min) (ACUTE ONLY): 18 min   Charges:   PT Evaluation $PT Eval Moderate Complexity: 1 Mod     Ina Homes, PT, DPT Acute Rehabilitation Services  Pager [phone] Office [phone]  Malachy Chamber 07/21/2018, 10:18 AM

## 2018-07-21 NOTE — ED Triage Notes (Signed)
Pt arrived via [facility]; pt from [facility] with c/o unwitnessed fall; pt found by CNA lying on L side and c/o L shlder and neck pain. Pt placed in c-collar due to neck pain complaint; A/Ox3 with some confusion; 154/841, 74, 97% on RA; CBG 130

## 2018-07-21 NOTE — ED Notes (Signed)
ED TO INPATIENT HANDOFF REPORT  ED Nurse Name and Phone #: [phone]  S Name/Age/Gender Deanna Page 85 y.o. female Room/Bed: 035C/035C  Code Status   Code Status: Full Code  Home/SNF/Other Home Patient oriented to: self Is this baseline? Yes   Triage Complete: Triage complete  Chief Complaint fall  Triage Note Pt arrived via [facility]; pt from [facility] with c/o unwitnessed fall; pt found by CNA lying on L side and c/o L shlder and neck pain. Pt placed in c-collar due to neck pain complaint; A/Ox3 with some confusion; 154/841, 74, 97% on RA; CBG 130   Allergies Allergies  Allergen Reactions  . Erythromycin     Other reaction(s): Other (See Comments) Other Reaction: GI Upset Unknown reaction  . Penicillins Rash and Shortness Of Breath    Unknown reaction Has patient had a PCN reaction causing immediate rash, facial/tongue/throat swelling, SOB or lightheadedness with hypotension: Unknown Has patient had a PCN reaction causing severe rash involving mucus membranes or skin necrosis: Unknown Has patient had a PCN reaction that required hospitalization: Unknown Has patient had a PCN reaction occurring within the last 10 years: Unknown If all of the above answers are "NO", then may proceed with Cephalosporin use.   Mart Piggs. Aricept [Donepezil Hcl]     Unknown reaction  . Clindamycin/Lincomycin Itching  . Doxycycline     Unknown reaction  . Pneumococcal Vaccines     Unknown reaction    Level of Care/Admitting Diagnosis ED Disposition    ED Disposition Condition Comment   Admit  [facility] [[id number]]  Level of Care: Telemetry Medical [104]  I expect the patient will be discharged within 24 hours: No (not a candidate for 5C-Observation unit)  Covid Evaluation: Screening Protocol (No Symptoms)  Diagnosis: Acute ischemic left PCA stroke [facility](HCC) [[id number]][[id number]]  Admitting Physician: Briscoe DeutscherPYD, TIMOTHY S [[id number]]  Attending Physician: Briscoe DeutscherPYD, TIMOTHY S  [[id number]]  PT Class (Do Not Modify): Observation [104]  PT Acc Code (Do Not Modify): Observation [10022]       B Medical/Surgery History Past Medical History:  Diagnosis Date  . Allergic rhinitis   . Anxiety   . Asthma   . Bowel obstruction (HCC)   . CKD (chronic kidney disease), stage III (HCC)   . Dementia (HCC)   . Diverticulosis   . Esophageal dysmotility   . Esophageal stricture 2005  . GERD (gastroesophageal reflux disease)   . Hepatitis A   . Hiatal hernia   . Hypertension   . Hypothyroidism   . Internal hemorrhoids   . Irritable bowel syndrome   . Ischemic colitis, enteritis, or enterocolitis (HCC) 2010  . Major depression    with paranoia and insomnia  . OSA (obstructive sleep apnea)   . Parkinson disease (HCC)   . Pneumonia   . TIA (transient ischemic attack)   . Urinary incontinence   . Vitamin D deficiency    Past Surgical History:  Procedure Laterality Date  . ABDOMINAL HYSTERECTOMY  1969  . APPENDECTOMY  1969  . BLADDER SURGERY    . BREAST LUMPECTOMY  1995  . CATARACT EXTRACTION    . CHOLECYSTECTOMY  2000  . CORNEAL TRANSPLANT Left   . INTRAOCULAR PROSTHESES INSERTION     [facility] eye [facility]  . TUBAL LIGATION  1968     A IV Location/Drains/Wounds Patient Lines/Drains/Airways Status   Active Line/Drains/Airways    Name:   Placement date:   Placement time:   Site:  Days:   Peripheral IV 02/11/18 Left Hand   02/11/18    1431    Hand   160   Peripheral IV 02/11/18 Right Antecubital   02/11/18    2148    Antecubital   160   External Urinary Catheter   02/11/18    2109    -   160          Intake/Output Last 24 hours No intake or output data in the 24 hours ending 07/21/18 0533  Labs/Imaging Results for orders placed or performed during the hospital encounter of 07/21/18 (from the past 48 hour(s))  Ethanol     Status: None   Collection Time: 07/21/18  4:05 AM  Result Value Ref Range   Alcohol, Ethyl (B) <10 <10 mg/dL    Comment:  (NOTE) Lowest detectable limit for serum alcohol is 10 mg/dL. For medical purposes only. Performed at [facility], [address] [place], [place] [address]   CBC     Status: Abnormal   Collection Time: 07/21/18  4:05 AM  Result Value Ref Range   WBC 13.6 (H) 4.0 - 10.5 K/uL   RBC 3.83 (L) 3.87 - 5.11 MIL/uL   Hemoglobin 11.7 (L) 12.0 - 15.0 g/dL   HCT 60.4 54.0 - 98.1 %   MCV 96.1 80.0 - 100.0 fL   MCH 30.5 26.0 - 34.0 pg   MCHC 31.8 30.0 - 36.0 g/dL   RDW 19.1 47.8 - 29.5 %   Platelets 183 150 - 400 K/uL   nRBC 0.0 0.0 - 0.2 %    Comment: Performed at [facility], [address], [place], [place] [address]  Differential     Status: Abnormal   Collection Time: 07/21/18  4:05 AM  Result Value Ref Range   Neutrophils Relative % 81 %   Neutro Abs 10.9 (H) 1.7 - 7.7 K/uL   Lymphocytes Relative 9 %   Lymphs Abs 1.2 0.7 - 4.0 K/uL   Monocytes Relative 8 %   Monocytes Absolute 1.1 (H) 0.1 - 1.0 K/uL   Eosinophils Relative 1 %   Eosinophils Absolute 0.2 0.0 - 0.5 K/uL   Basophils Relative 1 %   Basophils Absolute 0.1 0.0 - 0.1 K/uL   Immature Granulocytes 0 %   Abs Immature Granulocytes 0.05 0.00 - 0.07 K/uL    Comment: Performed at [facility], [address], [place], [place] [address]  Comprehensive metabolic panel     Status: Abnormal   Collection Time: 07/21/18  4:05 AM  Result Value Ref Range   Sodium 142 135 - 145 mmol/L   Potassium 4.1 3.5 - 5.1 mmol/L   Chloride 106 98 - 111 mmol/L   CO2 22 22 - 32 mmol/L   Glucose, Bld 122 (H) 70 - 99 mg/dL   BUN 31 (H) 8 - 23 mg/dL   Creatinine, Ser 4.69 (H) 0.44 - 1.00 mg/dL   Calcium 9.8 8.9 - 62.9 mg/dL   Total Protein 7.0 6.5 - 8.1 g/dL   Albumin 3.8 3.5 - 5.0 g/dL   AST 26 15 - 41 U/L   ALT 7 0 - 44 U/L   Alkaline Phosphatase 80 38 - 126 U/L   Total Bilirubin 0.6 0.3 - 1.2 mg/dL   GFR calc non Af Amer 30 (L) >60 mL/min   GFR calc Af Amer 35 (L) >60 mL/min   Anion gap 14 5 - 15    Comment:  Performed at  [facility], [address] [place], [place], [place] [address]  Urine rapid drug screen (hosp performed)     Status: None   Collection Time: 07/21/18  4:05 AM  Result Value Ref Range   Opiates NONE DETECTED NONE DETECTED   Cocaine NONE DETECTED NONE DETECTED   Benzodiazepines NONE DETECTED NONE DETECTED   Amphetamines NONE DETECTED NONE DETECTED   Tetrahydrocannabinol NONE DETECTED NONE DETECTED   Barbiturates NONE DETECTED NONE DETECTED    Comment: (NOTE) DRUG SCREEN FOR MEDICAL PURPOSES ONLY.  IF CONFIRMATION IS NEEDED FOR ANY PURPOSE, NOTIFY LAB WITHIN 5 DAYS. LOWEST DETECTABLE LIMITS FOR URINE DRUG SCREEN Drug Class                     Cutoff (ng/mL) Amphetamine and metabolites    1000 Barbiturate and metabolites    200 Benzodiazepine                 200 Tricyclics and metabolites     300 Opiates and metabolites        300 Cocaine and metabolites        300 THC                            50 Performed at [facility], [address], [place], [place] [address]   Urinalysis, Routine w reflex microscopic     Status: Abnormal   Collection Time: 07/21/18  4:05 AM  Result Value Ref Range   Color, Urine YELLOW YELLOW   APPearance HAZY (A) CLEAR   Specific Gravity, Urine 1.017 1.005 - 1.030   pH 5.0 5.0 - 8.0   Glucose, UA NEGATIVE NEGATIVE mg/dL   Hgb urine dipstick NEGATIVE NEGATIVE   Bilirubin Urine NEGATIVE NEGATIVE   Ketones, ur 5 (A) NEGATIVE mg/dL   Protein, ur NEGATIVE NEGATIVE mg/dL   Nitrite NEGATIVE NEGATIVE   Leukocytes,Ua SMALL (A) NEGATIVE   RBC / HPF 6-10 0 - 5 RBC/hpf   WBC, UA >50 (H) 0 - 5 WBC/hpf   Bacteria, UA NONE SEEN NONE SEEN    Comment: Performed at [facility], [address], [place], [place] [address]  Protime-INR     Status: None   Collection Time: 07/21/18  4:30 AM  Result Value Ref Range   Prothrombin Time 13.5 11.4 - 15.2 seconds   INR 1.0 0.8 - 1.2    Comment: (NOTE) INR goal varies based on device and disease  states. Performed at [facility], [address], [place], [place] [address]   APTT     Status: None   Collection Time: 07/21/18  4:30 AM  Result Value Ref Range   aPTT 25 24 - 36 seconds    Comment: Performed at [facility], [address] [place], [place] [address]   Ct Head Wo Contrast  Result Date: 07/21/2018 CLINICAL DATA:  85 year old female unwitnessed fall. EXAM: CT HEAD WITHOUT CONTRAST CT CERVICAL SPINE WITHOUT CONTRAST TECHNIQUE: Multidetector CT imaging of the head and cervical spine was performed following the standard protocol without intravenous contrast. Multiplanar CT image reconstructions of the cervical spine were also generated. COMPARISON:  Head CT 12/30/2016 and earlier. FINDINGS: CT HEAD FINDINGS Brain: Confluent hypodensity in the left occipital lobe affecting gray and white matter most resembles an acute to subacute infarct (series 3, image 16). Possible mild petechial hemorrhage but no malignant hemorrhagic transformation. No significant mass effect.  Stable gray-white matter differentiation elsewhere with confluent white matter hypodensity. No other intracranial blood products. Vascular: Calcified atherosclerosis at the skull base. No suspicious intracranial vascular hyperdensity. Skull: No acute fracture identified. Sinuses/Orbits: New right mastoid effusion since 2018, sparing the mastoid antrum. The right tympanic cavity remains clear. The left tympanic cavity remains clear but there is also mild new left mastoid fluid. Visible paranasal sinuses are stable in clear. Other: No scalp hematoma identified. Negative orbits. CT CERVICAL SPINE FINDINGS Alignment: Chronic straightening of cervical lordosis. Cervicothoracic junction alignment is within normal limits. Bilateral posterior element alignment is within normal limits. Skull base and vertebrae: Stable skull base and craniocervical junction alignment but severe chronic left C1-occipital condyle degeneration.  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 Superimposed congenital incomplete segmentation of C2-C3, and ankylosis of the left C3-C4 posterior elements. Superimposed right side C1-C2 joint degeneration also. No acute osseous abnormality identified. Soft tissues and spinal canal: No prevertebral fluid or swelling. No visible canal hematoma. Disc levels: Advanced degenerative change from the skull base to C2 as stated above. Incomplete segmentation of C2-C3 as stated above as well as left C3-C4 and also C5-C6 posterior element ankylosis. Upper chest: Visible upper thoracic levels appear grossly intact. Negative visible lung apices. Other: There is a proximal left humerus fracture visible on the scout view. IMPRESSION: 1. Acute to subacute appearing Left PCA territory infarct. Possible associated Petechial Hemorrhage but no malignant hemorrhagic transformation. No intracranial mass effect. 2. No superimposed acute traumatic injury identified in the head or cervical spine. Proximal left humerus fracture is visible on the scout view. 3. 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 Electronically Signed   By: Odessa Fleming M.D.   On: 07/21/2018 03:53   Ct Cervical Spine Wo Contrast  Result Date: 07/21/2018 CLINICAL DATA:  85 year old female unwitnessed fall. EXAM: CT HEAD WITHOUT CONTRAST CT CERVICAL SPINE WITHOUT CONTRAST TECHNIQUE: Multidetector CT imaging of the head and cervical spine was performed following the standard protocol without intravenous contrast. Multiplanar CT image reconstructions of the cervical spine were also generated. COMPARISON:  Head CT 12/30/2016 and earlier. FINDINGS: CT HEAD FINDINGS Brain: Confluent hypodensity in the left occipital lobe affecting gray and white matter most resembles an acute to subacute infarct (series 3, image 16). Possible mild petechial hemorrhage but no malignant hemorrhagic transformation.  No significant mass effect. Stable gray-white matter differentiation elsewhere with confluent white matter hypodensity. No other intracranial blood products. Vascular: Calcified atherosclerosis at the skull base. No suspicious intracranial vascular hyperdensity. Skull: No acute fracture identified. Sinuses/Orbits: New right mastoid effusion since 2018, sparing the mastoid antrum. The right tympanic cavity remains clear. The left tympanic cavity remains clear but there is also mild new left mastoid fluid. Visible paranasal sinuses are stable in clear. Other: No scalp hematoma identified. Negative orbits. CT CERVICAL SPINE FINDINGS Alignment: Chronic straightening of cervical lordosis. Cervicothoracic junction alignment is within normal limits. Bilateral posterior element alignment is within normal limits. Skull base and vertebrae: Stable skull base and craniocervical junction alignment but severe chronic left C1-occipital condyle degeneration. 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 Superimposed congenital incomplete segmentation of C2-C3, and ankylosis of the left C3-C4 posterior elements. Superimposed right side C1-C2 joint degeneration also. No acute osseous abnormality identified. Soft tissues and spinal canal: No prevertebral fluid or swelling. No visible canal hematoma. Disc levels: Advanced degenerative change from the skull base to C2 as stated above. Incomplete segmentation of C2-C3 as stated above as well  as left C3-C4 and also C5-C6 posterior element ankylosis. Upper chest: Visible upper thoracic levels appear grossly intact. Negative visible lung apices. Other: There is a proximal left humerus fracture visible on the scout view. IMPRESSION: 1. Acute to subacute appearing Left PCA territory infarct. Possible associated Petechial Hemorrhage but no malignant hemorrhagic transformation. No intracranial mass effect. 2. No superimposed acute traumatic injury identified in the head or  cervical spine. Proximal left humerus fracture is visible on the scout view. 3. 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 Electronically Signed   By: Odessa Fleming M.D.   On: 07/21/2018 03:53   Dg Shoulder Left  Result Date: 07/21/2018 CLINICAL DATA:  85 y/o  F; unwitnessed fall. Left shoulder pain. EXAM: LEFT SHOULDER - 2+ VIEW COMPARISON:  06/20/2016 left shoulder MRI FINDINGS: Acute fracture of the left proximal humeral diaphysis with 1 shaft's width medial displacement of the shaft, 3 cm overriding. No shoulder joint dislocation. IMPRESSION: Acute fracture of the left proximal humeral diaphysis with 1 shaft's width medial displacement of the shaft, 3 cm overriding. No dislocation. Electronically Signed   By: Mitzi Hansen M.D.   On: 07/21/2018 03:07    Pending Labs Unresulted Labs (From admission, onward)    Start     Ordered   07/22/18 0500  Hemoglobin A1c  Tomorrow morning,   R     07/21/18 0509   07/22/18 0500  Lipid panel  Tomorrow morning,   R    Comments:  Fasting    07/21/18 0509   07/21/18 0512  SARS Coronavirus 2 (CEPHEID - Performed in [facility] hospital lab), Hosp Order  (Asymptomatic Patients Labs)  Once,   R    Question:  Rule Out  Answer:  Yes   07/21/18 0511          Vitals/Pain Today's Vitals   07/21/18 0207 07/21/18 0209 07/21/18 0215 07/21/18 0447  BP:    (!) 131/111  Pulse:    96  Resp:    17  Temp:  99 F (37.2 C) 100 F (37.8 C)   TempSrc:  Oral Rectal   SpO2:    96%  Weight: 53.1 kg     Height: 5\' 3"  (1.6 m)     PainSc:        Isolation Precautions No active isolations  Medications Medications  trimethoprim (TRIMPEX) tablet 100 mg (has no administration in time range)  citalopram (CELEXA) tablet 30 mg (has no administration in time range)  memantine (NAMENDA) tablet 10 mg (has no administration in time range)  perphenazine (TRILAFON) tablet 4 mg (has no administration in  time range)  levothyroxine (SYNTHROID) tablet 50 mcg (has no administration in time range)  pantoprazole (PROTONIX) EC tablet 40 mg (has no administration in time range)  senna-docusate (Senokot-S) tablet 2 tablet (has no administration in time range)  carbidopa-levodopa (SINEMET IR) 25-100 MG per tablet immediate release 1.5 tablet (has no administration in time range)  Carbidopa-Levodopa ER (SINEMET CR) 25-100 MG tablet controlled release 1 tablet (has no administration in time range)  clonazePAM (KLONOPIN) tablet 0.5 mg (has no administration in time range)  fluorometholone (FML) 0.1 % ophthalmic suspension 1 drop (has no administration in time range)   stroke: mapping our early stages of recovery book (has no administration in time range)  acetaminophen (TYLENOL) tablet 650 mg (has no administration in time range)    Or  acetaminophen (TYLENOL) solution 650 mg (has no administration in time range)  Or  acetaminophen (TYLENOL) suppository 650 mg (has no administration in time range)  0.9 %  sodium chloride infusion (has no administration in time range)  morphine 2 MG/ML injection 1-3 mg (has no administration in time range)    Mobility walks with device High fall risk   Focused Assessments Neuro Assessment Handoff:  Swallow screen pass? No    NIH Stroke Scale ( + Modified Stroke Scale Criteria)  Interval: Initial Level of Consciousness (1a.)   : Alert, keenly responsive LOC Questions (1b. )   +: Answers neither question correctly LOC Commands (1c. )   + : Performs one task correctly Best Gaze (2. )  +: Normal Visual (3. )  +: No visual loss Facial Palsy (4. )    : Normal symmetrical movements Motor Arm, Left (5a. )   +: No effort against gravity Motor Arm, Right (5b. )   +: Some effort against gravity Motor Leg, Left (6a. )   +: Some effort against gravity Motor Leg, Right (6b. )   +: Some effort against gravity Limb Ataxia (7. ): Present in two limbs Sensory (8. )   +:  Normal, no sensory loss Best Language (9. )   +: No aphasia Dysarthria (10. ): Normal Extinction/Inattention (11.)   +: No Abnormality Modified SS Total  +: 12 Complete NIHSS TOTAL: 14     Neuro Assessment: Exceptions to WDL Neuro Checks:   Initial (07/21/18 0217)  Last Documented NIHSS Modified Score: 12 (07/21/18 0415) Has TPA been given? No If patient is a Neuro Trauma and patient is going to OR before floor call report to 4N Charge nurse: [phone] or [phone]     R Recommendations: See Admitting Provider Note  Report given to:   Additional Notes:

## 2018-07-21 NOTE — Evaluation (Signed)
Speech Language Pathology Evaluation Patient Details Name: Deanna Page MRN: [id number] DOB: 1930-01-14 Today's Date: 07/21/2018 Time: 8616-8372 SLP Time Calculation (min) (ACUTE ONLY): 14 min  Problem List:  Patient Active Problem List   Diagnosis Date Noted  . Acute ischemic left PCA stroke (HCC) 07/21/2018  . Closed fracture of proximal end of left humerus, initial encounter 07/21/2018  . Hypothyroidism 07/21/2018  . CKD (chronic kidney disease), stage III (HCC) 07/21/2018  . Fever in adult   . Chest pain in adult 02/11/2018  . Suicidal ideation   . Altered mental status 12/30/2016  . UTI (urinary tract infection) 12/30/2016  . Dementia (HCC) 12/30/2016  . Parkinson's disease (HCC) 03/01/2013  . GASTRITIS 02/22/2010  . ABDOMINAL PAIN-EPIGASTRIC 02/22/2010  . DIVERTICULOSIS-COLON 12/19/2008  . OTHER DYSPHAGIA 10/07/2007  . TIA 01/21/2007  . DIARRHEA 01/21/2007  . GAIT DISTURBANCE 01/01/2007  . GASTROESOPHAGEAL REFLUX DISEASE 12/20/2006  . HYSTERECTOMY, HX OF 12/20/2006  . APPENDECTOMY, HX OF 12/20/2006  . SYNCOPE 10/01/2006  . SYMPTOM, MEMORY LOSS 09/16/2006  . BRONCHITIS, ACUTE 06/30/2006  . DEPRESSION 06/02/2006  . Essential hypertension 06/02/2006  . ALLERGIC RHINITIS 06/02/2006  . ASTHMA 06/02/2006  . PROLAPSE, VAGINAL WALL, RECTOCELE 06/02/2006  . DIZZINESS OR VERTIGO 06/02/2006  . URINARY INCONTINENCE 06/02/2006  . PNEUMONIA, HX OF 06/02/2006  . BLADDER REPAIR, HX OF 06/02/2006  . INSOMNIA, HX OF 06/02/2006   Past Medical History:  Past Medical History:  Diagnosis Date  . Allergic rhinitis   . Anxiety   . Asthma   . Bowel obstruction (HCC)   . CKD (chronic kidney disease), stage III (HCC)   . Dementia (HCC)   . Diverticulosis   . Esophageal dysmotility   . Esophageal stricture 2005  . GERD (gastroesophageal reflux disease)   . Hepatitis A   . Hiatal hernia   . Hypertension   . Hypothyroidism   . Internal hemorrhoids   . Irritable bowel  syndrome   . Ischemic colitis, enteritis, or enterocolitis (HCC) 2010  . Major depression    with paranoia and insomnia  . OSA (obstructive sleep apnea)   . Parkinson disease (HCC)   . Pneumonia   . TIA (transient ischemic attack)   . Urinary incontinence   . Vitamin D deficiency    Past Surgical History:  Past Surgical History:  Procedure Laterality Date  . ABDOMINAL HYSTERECTOMY  1969  . APPENDECTOMY  1969  . BLADDER SURGERY    . BREAST LUMPECTOMY  1995  . CATARACT EXTRACTION    . CHOLECYSTECTOMY  2000  . CORNEAL TRANSPLANT Left   . INTRAOCULAR PROSTHESES INSERTION     Left eye at [facility]  . TUBAL LIGATION  1968   HPI:  Pt is an 85 y.o. female with medical history significant for Parkinson's disease, memory loss, chronic kidney disease stage III, depression, hypothyroidism, and recurrent UTI, now presentng to the emergency department after an unwitnessed fall. CT of the head showed acute to subacute appearing left PCA territory infarct.    Assessment / Plan / Recommendation Clinical Impression  Pt resides in a skilled nursing facility and has a history of memory loss. Her language skills were within functional limits. She was able to consistently follow 1-step commands, and answer simple yes/no questions but she demonstrated difficulty with more complex auditory comprehension tasks, possibly due to baseline memory deficits. No deficits were noted in verbal expression, and she presented with a reduced vocal intensity as well as imprecise articulation which are likely secondary to Parkinson's  disease. Cognitive deficits were noted related to memory, reasoning, attention, and problem solving but she will have the necessary level of support upon discharge. Skilled SLP services are not clinically indicated at this time.     SLP Assessment  SLP Recommendation/Assessment: Patient does not need any further Speech Lanaguage Pathology Services SLP Visit Diagnosis: Cognitive communication  deficit (R41.841)    Follow Up Recommendations  None;24 hour supervision/assistance    Frequency and Duration           SLP Evaluation Cognition  Overall Cognitive Status: History of cognitive impairments - at baseline Arousal/Alertness: Awake/alert Orientation Level: Oriented to person;Disoriented to place;Disoriented to time;Disoriented to situation Attention: Focused;Sustained Focused Attention: Impaired Focused Attention Impairment: Verbal complex Memory: Impaired Memory Impairment: Retrieval deficit;Decreased recall of new information(Immediate: 3/3; Delayed: 0/3 with cues: 1/3)       Comprehension  Auditory Comprehension Overall Auditory Comprehension: Impaired Yes/No Questions: Impaired Basic Biographical Questions: (4/4) Complex Questions: (2/5) Commands: Impaired One Step Basic Commands: (2/2) Two Step Basic Commands: (0/3)    Expression Expression Primary Mode of Expression: Verbal Verbal Expression Overall Verbal Expression: Appears within functional limits for tasks assessed Automatic Speech: Counting;Day of week;Month of year(WNL) Level of Generative/Spontaneous Verbalization: Sentence Repetition: No impairment Naming: No impairment Responsive: (4/4) Confrontation: (5/5) Convergent: Not tested Verbal Errors: (None') Written Expression Dominant Hand: Right   Oral / Motor  Motor Speech Overall Motor Speech: Impaired at baseline Respiration: Within functional limits Phonation: Breathy;Low vocal intensity Resonance: Within functional limits Articulation: Impaired Level of Impairment: Sentence Intelligibility: Intelligibility reduced Word: 75-100% accurate Phrase: 75-100% accurate Sentence: 75-100% accurate Conversation: 75-100% accurate Motor Planning: Witnin functional limits Motor Speech Errors: Not applicable   Bryla Burek I. Vear ClockPhillips, MS, CCC-SLP Acute Rehabilitation Services Office number [phone] Pager [phone]            Scheryl MartenShanika I  Athziry Millican 07/21/2018, 11:22 AM

## 2018-07-21 NOTE — Progress Notes (Signed)
Orthopedic Tech Progress Note Patient Details:  Deanna Page [birth year] [id number] RN said she would apply the arm sling to patient. Asked her does the unit have arm sling, she said they do. If she needs assistance I told her I would be there. Patient ID: Deanna Page, female   DOB: [birth year], 85 y.o.   MRN: [id number]   [name] 07/21/2018, 8:15 AM

## 2018-07-21 NOTE — Consult Note (Addendum)
Neurology Consultation  Reason for Consult: Subacute PCA stroke Referring Physician: Dr. Elvera Lennox  CC: PCA stroke  History is obtained from: chart, patient, daughter  HPI: Deanna Page is a 85 y.o. female with PMH of TIA, Parkinson's disease, CKD stage 3, MDD, hypothyroidism, and dementia presenting to ED on 5/19 after an unwitnessed fall at her nursing facility ([facility]).   Patient states she fell while trying to get out of bed last night; she doesn't remember what time that was or what time she last interacted with staff. She denies associated dizziness, chest pain, palpitations surrounding the fall. She denies fevers, chills, nausea, vomiting, abd pain, diarrhea, dysuria, cough, numbness/tingling, dysphagia, dysarthria.   Per [facility] MAR, patient not on antiplatelets. I was unable to reach patient's husband this morning but spoke with her daughter, Marcelino Duster, also listed as contact in chart. She states patient has been residing at [facility] for the past year and requires significant nursing care for help with toileting, transferring. She reports that her memory has been an issue for some years and that she has days where she is completely oriented, and days that she has significant trouble with memory. Marcelino Duster also reports ongoing issues with anxiety which are being addressed but do occur in flares.    In the ED, patient was afebrile, saturating adequately on room air, mildly hypertensive to 147.90, HR 80s.  EKG revealed sinus rhythm. CBC with mild leukocytosis to 13.6 (neutrophil predominant), mild stable anemia with Hgb 11.7, plts 183. CMP with electrolytes wnl; glu 122; BUN/Cr 31/1.53 (previous 1.3); LFTs unremarkable. UA with small leuks, 6-10 RBCs; nitrite negative PT/APTT wnl  Found to have left humerus fracture 2/2 fall presumably.  CT head with acute to subacute left PCA infarct with possible associated petechial hemorrhage w/o malignant hemorrhagic transformation;  no mass effect.  According to the daughter, she requires full-time nursing care.  Her cognition-she has severe memory deficits and has good and bad days but her memory waxes and wanes rapidly and she is more confused in unfamiliar surroundings.  LKW: unknown tpa given?: no, unknown last normal Premorbid modified Rankin scale (mRS): 4 ROS: A 14 point ROS was performed and is negative except as noted in the HPI.   Past Medical History:  Diagnosis Date  . Allergic rhinitis   . Anxiety   . Asthma   . Bowel obstruction (HCC)   . CKD (chronic kidney disease), stage III (HCC)   . Dementia (HCC)   . Diverticulosis   . Esophageal dysmotility   . Esophageal stricture 2005  . GERD (gastroesophageal reflux disease)   . Hepatitis A   . Hiatal hernia   . Hypertension   . Hypothyroidism   . Internal hemorrhoids   . Irritable bowel syndrome   . Ischemic colitis, enteritis, or enterocolitis (HCC) 2010  . Major depression    with paranoia and insomnia  . OSA (obstructive sleep apnea)   . Parkinson disease (HCC)   . Pneumonia   . TIA (transient ischemic attack)   . Urinary incontinence   . Vitamin D deficiency    Stroke risk factors Essential (primary) hypertension and CKD Stage 3 (GFR 30-59)   Family History  Problem Relation Age of Onset  . Stroke Mother   . Parkinson's disease Mother   . Lung cancer Father   . ALS Sister   . Liver disease Sister   . Parkinson's disease Maternal Grandmother   . Parkinson's disease Maternal Aunt   . Emphysema  Sister   . Emphysema Brother   . Colon cancer Neg Hx   . Colon polyps Neg Hx   . Diabetes Neg Hx   . Gallbladder disease Neg Hx   . Esophageal cancer Neg Hx     Social History:   reports that she has never smoked. She has never used smokeless tobacco. She reports that she does not drink alcohol or use drugs. Lives at [facility] nursing facility; requires significant help with iADLs.  Medications  Current Facility-Administered  Medications:  .   stroke: mapping our early stages of recovery book, , Does not apply, Once, Opyd, Timothy S, MD .  0.9 %  sodium chloride infusion, , Intravenous, Continuous, Opyd, Lavone Neri, MD .  acetaminophen (TYLENOL) tablet 650 mg, 650 mg, Oral, Q4H PRN **OR** acetaminophen (TYLENOL) solution 650 mg, 650 mg, Per Tube, Q4H PRN **OR** acetaminophen (TYLENOL) suppository 650 mg, 650 mg, Rectal, Q4H PRN, Opyd, Lavone Neri, MD .  aspirin EC tablet 325 mg, 325 mg, Oral, Daily **OR** aspirin suppository 300 mg, 300 mg, Rectal, Daily, Marvel Plan, MD .  carbidopa-levodopa (SINEMET IR) 25-100 MG per tablet immediate release 1.5 tablet, 1.5 tablet, Oral, QID, Opyd, Lavone Neri, MD .  Carbidopa-Levodopa ER (SINEMET CR) 25-100 MG tablet controlled release 1 tablet, 1 tablet, Oral, Q0600, Opyd, Lavone Neri, MD .  citalopram (CELEXA) tablet 30 mg, 30 mg, Oral, Daily, Opyd, Lavone Neri, MD .  clonazePAM (KLONOPIN) tablet 0.5 mg, 0.5 mg, Oral, TID, Opyd, Timothy S, MD .  fluorometholone (FML) 0.1 % ophthalmic suspension 1 drop, 1 drop, Left Eye, Daily, Opyd, Lavone Neri, MD .  levothyroxine (SYNTHROID) tablet 50 mcg, 50 mcg, Oral, Q0600, Opyd, Lavone Neri, MD .  memantine (NAMENDA) tablet 10 mg, 10 mg, Oral, BID, Opyd, Timothy S, MD .  morphine 2 MG/ML injection 1-3 mg, 1-3 mg, Intravenous, Q4H PRN, Opyd, Lavone Neri, MD .  pantoprazole (PROTONIX) EC tablet 40 mg, 40 mg, Oral, Daily, Opyd, Timothy S, MD .  perphenazine (TRILAFON) tablet 4 mg, 4 mg, Oral, BID, Opyd, Lavone Neri, MD .  senna-docusate (Senokot-S) tablet 2 tablet, 2 tablet, Oral, QHS, Opyd, Timothy S, MD .  trimethoprim (TRIMPEX) tablet 100 mg, 100 mg, Oral, Daily, Opyd, Lavone Neri, MD  Exam: Current vital signs: BP (!) 157/112 (BP Location: Right Arm)   Pulse 88   Temp 98.7 F (37.1 C) (Oral)   Resp 20   Ht 5\' 3"  (1.6 m) Comment: per facility ppwk  Wt 53.1 kg Comment: per facility ppwk  SpO2 100%   BMI 20.73 kg/m  Vital signs in last 24  hours: Temp:  [98.7 F (37.1 C)-100 F (37.8 C)] 98.7 F (37.1 C) (05/19 0647) Pulse Rate:  [82-96] 88 (05/19 0647) Resp:  [17-20] 20 (05/19 0647) BP: (126-157)/(90-112) 157/112 (05/19 0647) SpO2:  [96 %-100 %] 100 % (05/19 0647) Weight:  [53.1 kg] 53.1 kg (05/19 0207)  GENERAL: Awake, intermittently alert in NAD HEENT: - Normocephalic and atraumatic, dry mm LUNGS - Clear to auscultation bilaterally with no wheezes or rales CV - S1S2 RRR, no m/r/g, equal pulses bilaterally. ABDOMEN - Soft, nontender, nondistended with normoactive BS Ext: warm, well perfused, intact peripheral pulses, no edema; large ecchymosis on left upper arm   NEURO:  Mental Status: AA&Ox2 to self and month/year Language: speech is mildly dysarthric. Unable to name or repeat.  Poor attention concentration Cranial Nerves: PERRL 31mm/brisk. EOMI, visual fields full, no facial asymmetry, facial sensation intact, hearing intact, tongue/uvula/soft palate midline.  Motor: 4/5 throughout but unable to test LUE completely due to humeral fracture Tone: is normal to increased especially in UEs with rolling tremors, decreased bulk Sensation- Intact to light touch bilaterally Coordination: FTN only able to be tested on right with slight dysmetria present Gait- deferred 1a Level of Conscious.: 0 1b LOC Questions: 1 1c LOC Commands: 0 2 Best Gaze: 0 3 Visual: 0 4 Facial Palsy: 0 5a Motor Arm - left: 0 5b Motor Arm - Right: 0 6a Motor Leg - Left: 0 6b Motor Leg - Right: 0 7 Limb Ataxia: 0 8 Sensory: 0 9 Best Language: 1 10 Dysarthria: 1 11 Extinct. and Inatten.: 0 TOTAL: 3  Labs I have reviewed labs in epic and the results pertinent to this consultation are:  CBC    Component Value Date/Time   WBC 13.6 (H) 07/21/2018 0405   RBC 3.83 (L) 07/21/2018 0405   HGB 11.7 (L) 07/21/2018 0405   HCT 36.8 07/21/2018 0405   PLT 183 07/21/2018 0405   MCV 96.1 07/21/2018 0405   MCH 30.5 07/21/2018 0405   MCHC 31.8  07/21/2018 0405   RDW 13.6 07/21/2018 0405   LYMPHSABS 1.2 07/21/2018 0405   MONOABS 1.1 (H) 07/21/2018 0405   EOSABS 0.2 07/21/2018 0405   BASOSABS 0.1 07/21/2018 0405    CMP     Component Value Date/Time   NA 142 07/21/2018 0405   K 4.1 07/21/2018 0405   CL 106 07/21/2018 0405   CO2 22 07/21/2018 0405   GLUCOSE 122 (H) 07/21/2018 0405   GLUCOSE 69 (L) 12/24/2005 1037   BUN 31 (H) 07/21/2018 0405   CREATININE 1.53 (H) 07/21/2018 0405   CALCIUM 9.8 07/21/2018 0405   PROT 7.0 07/21/2018 0405   ALBUMIN 3.8 07/21/2018 0405   AST 26 07/21/2018 0405   ALT 7 07/21/2018 0405   ALKPHOS 80 07/21/2018 0405   BILITOT 0.6 07/21/2018 0405   GFRNONAA 30 (L) 07/21/2018 0405   GFRAA 35 (L) 07/21/2018 0405    Imaging I have reviewed the images obtained:  CT-scan of the brain: left PCA subacute infarct  MRI examination of the brain pending  Assessment:  85yo female with reported h/o TIA, Parkinson's disease with dementia requiring high level of care, HTN, CKD3, MDD, anxiety presenting after a fall at her nursing home overnight with no known last normal, found to have subacute left PCA infarct on CT head. No focal neuro deficits noted.    Cerebral infarction due to embolism of left posterior cerebral artery  Recommendations: -Telemetry monitoring -Allow for permissive hypertension for the first 24-48h - only treat PRN if SBP >220 mmHg. Blood pressures can be gradually normalized to SBP<140 upon discharge. -MRI brain without contrast -MRA head without contrast -Carotid dopplers -Echocardiogram -HgbA1c, fasting lipid panel -Frequent neuro checks -Prophylactic therapy-Antiplatelet med: Aspirin - dose 325mg  PO or 300mg  PR -Atorvastatin 80 mg PO daily -Risk factor modification -PT consult, OT consult, Speech consult -If Afib found on telemetry, will need anticoagulation. Decision pending imaging and stroke team rounding. -restart carbidopa-levidopa for Parkinson's disease -ortho to  evaluate for left humeral fracture and assess need for surgery vs conservative treatment -agree with IVF as she appears dehydrated on exam  Please page stroke NP/PA/MD (listed on AMION)  from 8am-4 pm as this patient will be followed by the stroke team at this point.  Nyra MarketGorica Svalina, MD PGY3  Attending addendum Patient seen and examined. I have independently reviewed imaging. Agree with the history and physical above. Agree  with the physical examination above that I confirmed independently.   -- Milon Dikes, MD [facility] Pager: [phone] If 7pm to 7am, please call on call as listed on AMION.

## 2018-07-21 NOTE — Progress Notes (Signed)
Carotid duplex completed. Results in Chart review CV Proc. [place],RVS 07/21/2018, 3:49 PM

## 2018-07-21 NOTE — Evaluation (Signed)
Clinical/Bedside Swallow Evaluation Patient Details  Name: Deanna Page MRN: [id number] Date of Birth: 05-02-1929  Today's Date: 07/21/2018 Time: SLP Start Time (ACUTE ONLY): 1422 SLP Stop Time (ACUTE ONLY): 1438 SLP Time Calculation (min) (ACUTE ONLY): 16 min  Past Medical History:  Past Medical History:  Diagnosis Date  . Allergic rhinitis   . Anxiety   . Asthma   . Bowel obstruction (HCC)   . CKD (chronic kidney disease), stage III (HCC)   . Dementia (HCC)   . Diverticulosis   . Esophageal dysmotility   . Esophageal stricture 2005  . GERD (gastroesophageal reflux disease)   . Hepatitis A   . Hiatal hernia   . Hypertension   . Hypothyroidism   . Internal hemorrhoids   . Irritable bowel syndrome   . Ischemic colitis, enteritis, or enterocolitis (HCC) 2010  . Major depression    with paranoia and insomnia  . OSA (obstructive sleep apnea)   . Parkinson disease (HCC)   . Pneumonia   . TIA (transient ischemic attack)   . Urinary incontinence   . Vitamin D deficiency    Past Surgical History:  Past Surgical History:  Procedure Laterality Date  . ABDOMINAL HYSTERECTOMY  1969  . APPENDECTOMY  1969  . BLADDER SURGERY    . BREAST LUMPECTOMY  1995  . CATARACT EXTRACTION    . CHOLECYSTECTOMY  2000  . CORNEAL TRANSPLANT Left   . INTRAOCULAR PROSTHESES INSERTION     [facility]  . TUBAL LIGATION  1968   HPI:  Pt is an 85 y.o. female with medical history significant for Parkinson's disease, memory loss, chronic kidney disease stage III, depression, hypothyroidism, and recurrent UTI, now presentng to the emergency department after an unwitnessed fall. CT of the head showed acute to subacute appearing left PCA territory infarct. MBS 11/2017 with trace penetration and mod vallecular residue. BSE 02/12/18 with immediate cough suspected due to residue mixing with thin. Dys 3, thin recommended.    Assessment / Plan / Recommendation Clinical Impression  Pt has been seen by  SLP in fall and winter of 2019 and was able to remain on Dys 3 texture and thin liquids. Pt impulsive and continuously drinks until she has reflexive cough. Suspect volume and/or velocity is excessive or decreased oral containment. Recommend formal assessment with MBS which will plan on tomorrow. She can have sips thin water via straw IF feedeer holds straw and removes from oral cavity after 2 sips until tomorrow and meds crushed in applesaue. Plan for MBS tomorrow.  SLP Visit Diagnosis: Dysphagia, unspecified (R13.10)    Aspiration Risk  Mild aspiration risk;Moderate aspiration risk    Diet Recommendation Other (Comment)(2 straw sips water at at time, meds with applesauce)   Liquid Administration via: Straw;Cup Medication Administration: Crushed with puree    Other  Recommendations Oral Care Recommendations: Oral care BID   Follow up Recommendations 24 hour supervision/assistance      Frequency and Duration min 2x/week  2 weeks       Prognosis Prognosis for Safe Diet Advancement: Fair Barriers to Reach Goals: Cognitive deficits      Swallow Study   General HPI: Pt is an 85 y.o. female with medical history significant for Parkinson's disease, memory loss, chronic kidney disease stage III, depression, hypothyroidism, and recurrent UTI, now presentng to the emergency department after an unwitnessed fall. CT of the head showed acute to subacute appearing left PCA territory infarct. MBS 11/2017 with trace penetration and mod  vallecular residue. BSE 02/12/18 with immediate cough suspected due to residue mixing with thin. Dys 3, thin recommended.  Type of Study: Bedside Swallow Evaluation Previous Swallow Assessment: (see HPI) Diet Prior to this Study: NPO Temperature Spikes Noted: Yes Respiratory Status: Room air History of Recent Intubation: No Behavior/Cognition: Alert;Cooperative;Pleasant mood;Confused;Requires cueing Oral Cavity Assessment: Within Functional Limits Oral Care  Completed by SLP: No Oral Cavity - Dentition: Adequate natural dentition Vision: Functional for self-feeding Self-Feeding Abilities: Needs assist Patient Positioning: Upright in bed Baseline Vocal Quality: Normal Volitional Cough: Strong    Oral/Motor/Sensory Function Overall Oral Motor/Sensory Function: Within functional limits   Ice Chips Ice chips: Not tested   Thin Liquid Thin Liquid: Impaired Presentation: Cup;Straw Pharyngeal  Phase Impairments: Throat Clearing - Immediate    Nectar Thick Nectar Thick Liquid: Not tested   Honey Thick Honey Thick Liquid: Not tested   Puree Puree: Within functional limits   Solid     Solid: Not tested      Royce Macadamia 07/21/2018,2:53 PM  Breck Coons Lonell Face.Ed Nurse, children's [phone] Office [phone]

## 2018-07-21 NOTE — Progress Notes (Signed)
No charge note  Patient seen and examined, admitted this morning by Dr. Antionette Char H&P reviewed and agree with the assessment and plan.  In brief, this is an 85 year old female with Parkinson's disease, dementia, chronic kidney disease, depression, hypothyroidism and recurrent UTIs who came to the hospital with an unwitnessed fall, and was found to be lying on the floor on her left side.  She was found to have an acute fracture of the left proximal humeral diaphysis, and noncontrast CT scan of the head was concerning for acute to subacute left PCA infarct.  Neurology as well as orthopedic surgery were consulted  Patient quite confused this morning, tries to get out of bed and appears agitated.  Does not interact with me  Ischemic left PCA stroke -Neurology consulted, MRI of the brain ordered to further characterize however given level of agitation doubt it is possible this morning  Dementia/Parkinson's disease/in-hospital delirium -Frequent reorientation, patient slightly calm and in the afternoon, sitter at bedside.  Continue home Sinemet, Namenda, Klonopin, Celexa  Chronic kidney disease stage III -Baseline 1.2-1.3, currently not far off at 1.5.  On IV fluids  Hypothyroidism -continue Synthroid   This [facility], discussed with internal medicine teaching service they will pick up care on 5/20 a.m.   Demaris Leavell M. Elvera Lennox, MD, PhD [facility]  Contact via  [email or web address]  [facility] Office Info P: [phone]  F: [phone]

## 2018-07-21 NOTE — Evaluation (Signed)
Occupational Therapy Evaluation Patient Details Name: Deanna Page MRN: [id number] DOB: 1930/02/26 Today's Date: 07/21/2018    History of Present Illness Pt is an 85 y.o. female admitted from [facility] on 07/21/18 after an unwitnessed fall with AMS and c/o L shoulder pain. Workup revealed L proximal humerus fx. 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 PMH includes Parkinson's disease, memory loss, CKD III, depression.   Clinical Impression   PT admitted with L PCA infarct with L proximal humerus fx. Pt currently with functional limitiations due to the deficits listed below (see OT problem list). Pt currently total +2 mOD (A) for transfer to the bathroom. Pt benefits from Parkinsons medication prior to session. Pt prefers to be called "Deanna Page"  Pt will benefit from skilled OT to increase their independence and safety with adls and balance to allow discharge [employer] return.     Follow Up Recommendations  SNF([facility] return)    Equipment Recommendations  3 in 1 bedside commode;Wheelchair (measurements OT);Wheelchair cushion (measurements OT)    Recommendations for Other Services       Precautions / Restrictions Precautions Precautions: Fall;Shoulder Precaution Comments: L UE sling required for comfort Required Braces or Orthoses: Sling Restrictions Weight Bearing Restrictions: Yes LUE Weight Bearing: Non weight bearing      Mobility Bed Mobility Overal bed mobility: Needs Assistance Bed Mobility: Supine to Sit     Supine to sit: Min assist     General bed mobility comments: pt initiated and activated core to sit up on EOB. pt sitting eob min guard (A)   Transfers Overall transfer level: Needs assistance Equipment used: 1 person hand held assist Transfers: Sit to/from Stand Sit to Stand: Mod assist         General transfer comment: pt require steady (A) and hand held support    Balance Overall balance assessment: Needs  assistance Sitting-balance support: Single extremity supported;Feet supported Sitting balance-Leahy Scale: Fair     Standing balance support: Single extremity supported;During functional activity Standing balance-Leahy Scale: Poor                             ADL either performed or assessed with clinical judgement   ADL Overall ADL's : Needs assistance/impaired Eating/Feeding: Moderate assistance   Grooming: Moderate assistance   Upper Body Bathing: Moderate assistance   Lower Body Bathing: Maximal assistance   Upper Body Dressing : Moderate assistance   Lower Body Dressing: Maximal assistance   Toilet Transfer: Moderate assistance;BSC Toilet Transfer Details (indicate cue type and reason): pt requires min (A) to the bathroom and moderate (A) exiting the bathroom. pt pushign up with arm rest and attemptig to use L UE with cues to not use it  ToiletingTeacher, music and Hygiene: Moderate assistance Toileting - Clothing Manipulation Details (indicate cue type and reason): pt attempting to perform self care      Functional mobility during ADLs: +2 for physical assistance;Moderate assistance General ADL Comments: pt with incr need for assistance leaving the bathroom. pt voiding in the bathroom and void informed to RN staff     Vision   Additional Comments: pt visually tracking therapist, located toilet paper and reachign with R UE. pt winking and smiling at therapist     Perception     Praxis      Pertinent Vitals/Pain Pain Assessment: Faces Faces Pain Scale: Hurts little more Pain Location: generalized with movement Pain Descriptors / Indicators: Grimacing  Pain Intervention(s): Repositioned;Monitored during session;Ice applied     Hand Dominance Right   Extremity/Trunk Assessment Upper Extremity Assessment Upper Extremity Assessment: LUE deficits/detail LUE Deficits / Details: L UE injury- sling placed during session and ice applied   Lower  Extremity Assessment Lower Extremity Assessment: Defer to PT evaluation   Cervical / Trunk Assessment Cervical / Trunk Assessment: Kyphotic   Communication Communication Communication: Other (comment)(delayed responses but able to state name)   Cognition Arousal/Alertness: Awake/alert Behavior During Therapy: Restless Overall Cognitive Status: History of cognitive impairments - at baseline                                 General Comments: pt able to state name and that she fell. pt reports to staff need to void but unsuccessful attempts with [facility] but when transfered with therapy to bathroom space voiding bladder   General Comments       Exercises     Shoulder Instructions      Home Living Family/patient expects to be discharged to:: Skilled nursing facility                                 Additional Comments: [facility]      Prior Functioning/Environment Level of Independence: Needs assistance        Comments: unknown level of (A) but has hx of dementia charted        OT Problem List: Decreased strength;Decreased activity tolerance;Impaired balance (sitting and/or standing);Decreased range of motion;Decreased cognition;Decreased safety awareness;Decreased knowledge of use of DME or AE;Decreased knowledge of precautions;Pain;Impaired UE functional use      OT Treatment/Interventions: Self-care/ADL training;Therapeutic exercise;Neuromuscular education;Energy conservation;DME and/or AE instruction;Manual therapy;Modalities;Splinting;Therapeutic activities;Cognitive remediation/compensation;Patient/family education;Balance training    OT Goals(Current goals can be found in the care plan section) Acute Rehab OT Goals Patient Stated Goal: to go to the bathroom OT Goal Formulation: Patient unable to participate in goal setting Time For Goal Achievement: 08/04/18 Potential to Achieve Goals: Good  OT Frequency: Min 2X/week   Barriers to D/C:             Co-evaluation PT/OT/SLP Co-Evaluation/Treatment: Yes Reason for Co-Treatment: For patient/therapist safety;To address functional/ADL transfers   OT goals addressed during session: ADL's and self-care;Proper use of Adaptive equipment and DME;Strengthening/ROM      AM-PAC OT "6 Clicks" Daily Activity     Outcome Measure Help from another person eating meals?: A Lot Help from another person taking care of personal grooming?: A Lot Help from another person toileting, which includes using toliet, bedpan, or urinal?: A Lot Help from another person bathing (including washing, rinsing, drying)?: A Lot Help from another person to put on and taking off regular upper body clothing?: A Lot Help from another person to put on and taking off regular lower body clothing?: Total 6 Click Score: 11   End of Session Nurse Communication: Mobility status;Precautions;Weight bearing status  Activity Tolerance: Patient tolerated treatment well Patient left: in chair;with call bell/phone within reach;with chair alarm set;with nursing/sitter in room(RN sitting with patient)  OT Visit Diagnosis: Unsteadiness on feet (R26.81);Muscle weakness (generalized) (M62.81)                Time: 4401-0272 OT Time Calculation (min): 16 min Charges:  OT General Charges $OT Visit: 1 Visit OT Evaluation $OT Eval Moderate Complexity: 1 Mod   Brynn Devika Dragovich, OTR/L  Acute Rehabilitation Services Pager: [phone] Office: [phone] .   Mateo FlowBrynn Analyah Mcconnon 07/21/2018, 9:34 AM

## 2018-07-22 ENCOUNTER — Inpatient Hospital Stay (HOSPITAL_COMMUNITY): Payer: Medicare (Managed Care)

## 2018-07-22 DIAGNOSIS — Y92129 Unspecified place in nursing home as the place of occurrence of the external cause: Secondary | ICD-10-CM

## 2018-07-22 DIAGNOSIS — R1310 Dysphagia, unspecified: Secondary | ICD-10-CM

## 2018-07-22 DIAGNOSIS — Z7982 Long term (current) use of aspirin: Secondary | ICD-10-CM

## 2018-07-22 DIAGNOSIS — Z79899 Other long term (current) drug therapy: Secondary | ICD-10-CM

## 2018-07-22 DIAGNOSIS — N179 Acute kidney failure, unspecified: Secondary | ICD-10-CM

## 2018-07-22 DIAGNOSIS — I63532 Cerebral infarction due to unspecified occlusion or stenosis of left posterior cerebral artery: Secondary | ICD-10-CM

## 2018-07-22 DIAGNOSIS — W19XXXA Unspecified fall, initial encounter: Secondary | ICD-10-CM

## 2018-07-22 DIAGNOSIS — G20 Parkinson's disease: Secondary | ICD-10-CM

## 2018-07-22 DIAGNOSIS — Z8673 Personal history of transient ischemic attack (TIA), and cerebral infarction without residual deficits: Secondary | ICD-10-CM

## 2018-07-22 DIAGNOSIS — F419 Anxiety disorder, unspecified: Secondary | ICD-10-CM

## 2018-07-22 DIAGNOSIS — F0280 Dementia in other diseases classified elsewhere without behavioral disturbance: Secondary | ICD-10-CM

## 2018-07-22 DIAGNOSIS — F329 Major depressive disorder, single episode, unspecified: Secondary | ICD-10-CM

## 2018-07-22 LAB — LIPID PANEL
Cholesterol: 156 mg/dL (ref 0–200)
HDL: 71 mg/dL (ref >40)
LDL Cholesterol: 75 mg/dL (ref 0–99)
Total CHOL/HDL Ratio: 2.2 RATIO
Triglycerides: 52 mg/dL (ref <150)
VLDL: 10 mg/dL (ref 0–40)

## 2018-07-22 LAB — ECHOCARDIOGRAM COMPLETE
Height: 63 in
Weight: 1872 oz

## 2018-07-22 LAB — BASIC METABOLIC PANEL
Anion gap: 10 (ref 5–15)
BUN: 33 mg/dL — ABNORMAL HIGH (ref 8–23)
CO2: 25 mmol/L (ref 22–32)
Calcium: 9.3 mg/dL (ref 8.9–10.3)
Chloride: 104 mmol/L (ref 98–111)
Creatinine, Ser: 1.41 mg/dL — ABNORMAL HIGH (ref 0.44–1.00)
GFR calc Af Amer: 39 mL/min — ABNORMAL LOW (ref >60)
GFR calc non Af Amer: 34 mL/min — ABNORMAL LOW (ref >60)
Glucose, Bld: 110 mg/dL — ABNORMAL HIGH (ref 70–99)
Potassium: 3.8 mmol/L (ref 3.5–5.1)
Sodium: 139 mmol/L (ref 135–145)

## 2018-07-22 LAB — CBC
HCT: 30.7 % — ABNORMAL LOW (ref 36.0–46.0)
Hemoglobin: 9.8 g/dL — ABNORMAL LOW (ref 12.0–15.0)
MCH: 30.7 pg (ref 26.0–34.0)
MCHC: 31.9 g/dL (ref 30.0–36.0)
MCV: 96.2 fL (ref 80.0–100.0)
Platelets: 142 10*3/uL — ABNORMAL LOW (ref 150–400)
RBC: 3.19 MIL/uL — ABNORMAL LOW (ref 3.87–5.11)
RDW: 14.2 % (ref 11.5–15.5)
WBC: 9.8 10*3/uL (ref 4.0–10.5)
nRBC: 0 % (ref 0.0–0.2)

## 2018-07-22 LAB — HEMOGLOBIN A1C
Hgb A1c MFr Bld: 5.5 % (ref 4.8–5.6)
Mean Plasma Glucose: 111.15 mg/dL

## 2018-07-22 MED ORDER — POVIDONE-IODINE 10 % EX SWAB: 2.0000 "application " | Freq: Once | CUTANEOUS | Status: DC

## 2018-07-22 MED ORDER — CHLORHEXIDINE GLUCONATE 4 % EX LIQD
60.0000 mL | Freq: Once | CUTANEOUS | Status: AC
  Administered 2018-07-22: 4 via TOPICAL
  Filled 2018-07-22: qty 60

## 2018-07-22 MED ORDER — VANCOMYCIN HCL IN DEXTROSE 1-5 GM/200ML-% IV SOLN
1000.0000 mg | INTRAVENOUS | Status: DC
  Filled 2018-07-22: qty 200

## 2018-07-22 MED ORDER — VANCOMYCIN HCL IN DEXTROSE 1-5 GM/200ML-% IV SOLN: 1000.0000 mg | INTRAVENOUS | Status: DC

## 2018-07-22 MED ORDER — SODIUM CHLORIDE 0.9 % IV SOLN
INTRAVENOUS | Status: AC
  Administered 2018-07-22 – 2018-07-23 (×2): via INTRAVENOUS

## 2018-07-22 MED ORDER — ATORVASTATIN CALCIUM 80 MG PO TABS: 80.0000 mg | ORAL_TABLET | Freq: Every day | ORAL | Status: DC

## 2018-07-22 MED ORDER — VANCOMYCIN HCL IN DEXTROSE 1-5 GM/200ML-% IV SOLN
1000.0000 mg | INTRAVENOUS | Status: AC
  Administered 2018-07-23: 1000 mg via INTRAVENOUS
  Filled 2018-07-22: qty 200

## 2018-07-22 MED ORDER — PRAVASTATIN SODIUM 10 MG PO TABS
20.0000 mg | ORAL_TABLET | Freq: Every day | ORAL | Status: DC
  Administered 2018-07-22 – 2018-07-24 (×3): 20 mg via ORAL
  Filled 2018-07-22 (×4): qty 2

## 2018-07-22 NOTE — Progress Notes (Signed)
  Echocardiogram 2D Echocardiogram has been performed.  Celene Skeen 07/22/2018, 11:58 AM

## 2018-07-22 NOTE — Progress Notes (Signed)
STROKE TEAM PROGRESS NOTE   INTERVAL HISTORY Pt lying in bed, just came back from modified barium study.  Initially planned for left humeral fracture procedure but it was canceled after modified barium study.  Patient left arm in sling, still has resting tremor with history of Parkinson disease.  Significant bradykinesia.  Vitals:   07/21/18 1958 07/22/18 0025 07/22/18 0510 07/22/18 0710  BP: 124/73 116/67 115/67 134/72  Pulse: 85 82 75 75  Resp: Temp: 98.2 F (36.8 C) 98 F (36.7 C) 98 F (36.7 C) 98.5 F (36.9 C)  TempSrc: Oral Oral Oral Oral  SpO2: 96% 97% 98% 99%  Weight:      Height:        CBC:  Recent Labs  Lab 07/21/18 0405 07/22/18 0423  WBC 13.6* 9.8  NEUTROABS 10.9*  --   HGB 11.7* 9.8*  HCT 36.8 30.7*  MCV 96.1 96.2  PLT 183 142*    Basic Metabolic Panel:  Recent Labs  Lab 07/21/18 0405 07/22/18 0423  NA 142 139  K 4.1 3.8  CL 106 104  CO2 22 25  GLUCOSE 122* 110*  BUN 31* 33*  CREATININE 1.53* 1.41*  CALCIUM 9.8 9.3   Lipid Panel:     Component Value Date/Time   CHOL 156 07/22/2018 0423   TRIG 52 07/22/2018 0423   TRIG 54 12/24/2005 1037   HDL 71 07/22/2018 0423   CHOLHDL 2.2 07/22/2018 0423   VLDL 10 07/22/2018 0423   LDLCALC 75 07/22/2018 0423   HgbA1c:  Lab Results  Component Value Date   HGBA1C 5.5 07/22/2018   Urine Drug Screen:     Component Value Date/Time   LABOPIA NONE DETECTED 07/21/2018 0405   COCAINSCRNUR NONE DETECTED 07/21/2018 0405   LABBENZ NONE DETECTED 07/21/2018 0405   AMPHETMU NONE DETECTED 07/21/2018 0405   THCU NONE DETECTED 07/21/2018 0405   LABBARB NONE DETECTED 07/21/2018 0405    Alcohol Level     Component Value Date/Time   ETH <10 07/21/2018 0405    IMAGING Ct Head Wo Contrast  Result Date: 07/21/2018 CLINICAL DATA:  85 year old female unwitnessed fall. EXAM: CT HEAD WITHOUT CONTRAST CT CERVICAL SPINE WITHOUT CONTRAST TECHNIQUE: Multidetector CT imaging of the head and cervical  spine was performed following the standard protocol without intravenous contrast. Multiplanar CT image reconstructions of the cervical spine were also generated. COMPARISON:  Head CT 12/30/2016 and earlier. FINDINGS: CT HEAD FINDINGS Brain: Confluent hypodensity in the left occipital lobe affecting gray and white matter most resembles an acute to subacute infarct (series 3, image 16). Possible mild petechial hemorrhage but no malignant hemorrhagic transformation. No significant mass effect. Stable gray-white matter differentiation elsewhere with confluent white matter hypodensity. No other intracranial blood products. Vascular: Calcified atherosclerosis at the skull base. No suspicious intracranial vascular hyperdensity. Skull: No acute fracture identified. Sinuses/Orbits: New right mastoid effusion since 2018, sparing the mastoid antrum. The right tympanic cavity remains clear. The left tympanic cavity remains clear but there is also mild new left mastoid fluid. Visible paranasal sinuses are stable in clear. Other: No scalp hematoma identified. Negative orbits. CT CERVICAL SPINE FINDINGS Alignment: Chronic straightening of cervical lordosis. Cervicothoracic junction alignment is within normal limits. Bilateral posterior element alignment is within normal limits. Skull base and vertebrae: Stable skull base and craniocervical junction alignment but severe chronic left C1-occipital condyle degeneration. 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 Superimposed congenital incomplete segmentation of C2-C3, and ankylosis of the left C3-C4  posterior elements. Superimposed right side C1-C2 joint degeneration also. No acute osseous abnormality identified. Soft tissues and spinal canal: No prevertebral fluid or swelling. No visible canal hematoma. Disc levels: Advanced degenerative change from the skull base to C2 as stated above. Incomplete segmentation of C2-C3 as stated above as well as left C3-C4 and also  C5-C6 posterior element ankylosis. Upper chest: Visible upper thoracic levels appear grossly intact. Negative visible lung apices. Other: There is a proximal left humerus fracture visible on the scout view. IMPRESSION: 1. Acute to subacute appearing Left PCA territory infarct. Possible associated Petechial Hemorrhage but no malignant hemorrhagic transformation. No intracranial mass effect. 2. No superimposed acute traumatic injury identified in the head or cervical spine. Proximal left humerus fracture is visible on the scout view. 3. 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 Electronically Signed   By: Odessa Fleming M.D.   On: 07/21/2018 03:53   Ct Cervical Spine Wo Contrast  Result Date: 07/21/2018 CLINICAL DATA:  85 year old female unwitnessed fall. EXAM: CT HEAD WITHOUT CONTRAST CT CERVICAL SPINE WITHOUT CONTRAST TECHNIQUE: Multidetector CT imaging of the head and cervical spine was performed following the standard protocol without intravenous contrast. Multiplanar CT image reconstructions of the cervical spine were also generated. COMPARISON:  Head CT 12/30/2016 and earlier. FINDINGS: CT HEAD FINDINGS Brain: Confluent hypodensity in the left occipital lobe affecting gray and white matter most resembles an acute to subacute infarct (series 3, image 16). Possible mild petechial hemorrhage but no malignant hemorrhagic transformation. No significant mass effect. Stable gray-white matter differentiation elsewhere with confluent white matter hypodensity. No other intracranial blood products. Vascular: Calcified atherosclerosis at the skull base. No suspicious intracranial vascular hyperdensity. Skull: No acute fracture identified. Sinuses/Orbits: New right mastoid effusion since 2018, sparing the mastoid antrum. The right tympanic cavity remains clear. The left tympanic cavity remains clear but there is also mild new left mastoid fluid. Visible  paranasal sinuses are stable in clear. Other: No scalp hematoma identified. Negative orbits. CT CERVICAL SPINE FINDINGS Alignment: Chronic straightening of cervical lordosis. Cervicothoracic junction alignment is within normal limits. Bilateral posterior element alignment is within normal limits. Skull base and vertebrae: Stable skull base and craniocervical junction alignment but severe chronic left C1-occipital condyle degeneration. 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 Superimposed congenital incomplete segmentation of C2-C3, and ankylosis of the left C3-C4 posterior elements. Superimposed right side C1-C2 joint degeneration also. No acute osseous abnormality identified. Soft tissues and spinal canal: No prevertebral fluid or swelling. No visible canal hematoma. Disc levels: Advanced degenerative change from the skull base to C2 as stated above. Incomplete segmentation of C2-C3 as stated above as well as left C3-C4 and also C5-C6 posterior element ankylosis. Upper chest: Visible upper thoracic levels appear grossly intact. Negative visible lung apices. Other: There is a proximal left humerus fracture visible on the scout view. IMPRESSION: 1. Acute to subacute appearing Left PCA territory infarct. Possible associated Petechial Hemorrhage but no malignant hemorrhagic transformation. No intracranial mass effect. 2. No superimposed acute traumatic injury identified in the head or cervical spine. Proximal left humerus fracture is visible on the scout view. 3. 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 Electronically Signed   By: Odessa Fleming M.D.   On: 07/21/2018 03:53   Mr Brain Wo Contrast  Result Date: 07/21/2018 CLINICAL DATA:  Initial evaluation for acute altered mental status, unwitnessed fall, left upper extremity weakness. EXAM: MRI HEAD WITHOUT CONTRAST MRA HEAD  WITHOUT CONTRAST TECHNIQUE: Multiplanar, multiecho pulse  sequences of the brain and surrounding structures were obtained without intravenous contrast. Angiographic images of the head were obtained using MRA technique without contrast. COMPARISON:  Prior CT from earlier the same day. FINDINGS: MRI HEAD FINDINGS Brain: Examination severely degraded by motion artifact. Generalized age-related cerebral atrophy with advanced chronic microvascular ischemic disease. Volume loss within the substantia nigra compatible with history of Parkinson's disease, grossly similar. Chronic microvascular ischemic changes noted within the pons as well. Encephalomalacia within the parasagittal left occipital lobe compatible with left PCA territory infarct. This is largely chronic in appearance by MRI with resolved diffusion abnormality. No abnormal foci of restricted diffusion to suggest acute or subacute ischemia. Gray-white matter differentiation otherwise maintained. No other areas of chronic cortical infarction. No acute or chronic intracranial hemorrhage. No visible mass lesion, mass effect, or midline shift on this motion degraded exam. Diffuse ventricular prominence related to global parenchymal volume loss without hydrocephalus. No extra-axial fluid collection. Vascular: Major intracranial vascular flow voids grossly maintained at the skull base. Diminutive vertebrobasilar system. Skull and upper cervical spine: Craniocervical junction grossly within normal limits. No obvious focal marrow replacing lesion. No appreciable scalp soft tissue abnormality. Sinuses/Orbits: Globes and orbital soft tissues demonstrate no acute finding. Paranasal sinuses are clear. Small bilateral mastoid effusions, of doubtful significance. Inner ear structures grossly normal. Other: None. MRA HEAD FINDINGS ANTERIOR CIRCULATION: Examination severely degraded by motion artifact. Visualized distal cervical segments of the internal carotid arteries are patent with antegrade flow. Normal flow related signal seen  throughout the petrous, cavernous, and supraclinoid ICAs. No obvious flow-limiting stenosis. 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 No definite associated aneurysm on this motion degraded exam. A1 segments patent. Anterior cerebral arteries grossly patent to their distal aspects. M1 segments grossly patent bilaterally. Distal MCA branches well perfused and symmetric. POSTERIOR CIRCULATION: Markedly diminutive vertebrobasilar system, not well assessed on this motion degraded exam. Left vertebral artery slightly dominant and may terminate in PICA. Proximal and mid basilar artery markedly diminutive and not well seen. 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 Superior cerebral arteries not well assessed on this examination. Both of the posterior cerebral arteries grossly patent to their distal aspects. No obvious intracranial aneurysm. IMPRESSION: MRI HEAD IMPRESSION: 1. Severely motion degraded exam. 2. No acute intracranial abnormality identified. 3. Left PCA territory infarct, largely chronic in appearance by MRI. 4. Underlying advanced cerebral atrophy with chronic small vessel ischemic disease. 5. Volume loss within the substantia nigra, compatible with history of Parkinson's disease. MRA HEAD IMPRESSION: 1. Severely motion degraded exam. 2. No large vessel occlusion or obvious flow-limiting stenosis. 3. 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 Electronically Signed   By: Rise Mu M.D.   On: 07/21/2018 16:29   Dg Shoulder Left  Result Date: 07/21/2018 CLINICAL DATA:  85 y/o  F; unwitnessed fall. Left shoulder pain. EXAM: LEFT SHOULDER - 2+ VIEW COMPARISON:  06/20/2016 left shoulder MRI FINDINGS: Acute fracture of the left proximal humeral diaphysis with 1 shaft's width medial displacement of the shaft, 3 cm overriding. No shoulder joint dislocation.  IMPRESSION: Acute fracture of the left proximal humeral diaphysis with 1 shaft's width medial displacement of the shaft, 3 cm overriding. No dislocation. Electronically Signed   By: Mitzi Hansen M.D.   On: 07/21/2018 03:07   Mr Maxine Glenn Head Wo Contrast  Result Date: 07/21/2018 CLINICAL DATA:  Initial evaluation for acute altered  mental status, unwitnessed fall, left upper extremity weakness. EXAM: MRI HEAD WITHOUT CONTRAST MRA HEAD WITHOUT CONTRAST TECHNIQUE: Multiplanar, multiecho pulse sequences of the brain and surrounding structures were obtained without intravenous contrast. Angiographic images of the head were obtained using MRA technique without contrast. COMPARISON:  Prior CT from earlier the same day. FINDINGS: MRI HEAD FINDINGS Brain: Examination severely degraded by motion artifact. Generalized age-related cerebral atrophy with advanced chronic microvascular ischemic disease. Volume loss within the substantia nigra compatible with history of Parkinson's disease, grossly similar. Chronic microvascular ischemic changes noted within the pons as well. Encephalomalacia within the parasagittal left occipital lobe compatible with left PCA territory infarct. This is largely chronic in appearance by MRI with resolved diffusion abnormality. No abnormal foci of restricted diffusion to suggest acute or subacute ischemia. Gray-white matter differentiation otherwise maintained. No other areas of chronic cortical infarction. No acute or chronic intracranial hemorrhage. No visible mass lesion, mass effect, or midline shift on this motion degraded exam. Diffuse ventricular prominence related to global parenchymal volume loss without hydrocephalus. No extra-axial fluid collection. Vascular: Major intracranial vascular flow voids grossly maintained at the skull base. Diminutive vertebrobasilar system. Skull and upper cervical spine: Craniocervical junction grossly within normal limits. No obvious focal marrow  replacing lesion. No appreciable scalp soft tissue abnormality. Sinuses/Orbits: Globes and orbital soft tissues demonstrate no acute finding. Paranasal sinuses are clear. Small bilateral mastoid effusions, of doubtful significance. Inner ear structures grossly normal. Other: None. MRA HEAD FINDINGS ANTERIOR CIRCULATION: Examination severely degraded by motion artifact. Visualized distal cervical segments of the internal carotid arteries are patent with antegrade flow. Normal flow related signal seen throughout the petrous, cavernous, and supraclinoid ICAs. No obvious flow-limiting stenosis. 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 No definite associated aneurysm on this motion degraded exam. A1 segments patent. Anterior cerebral arteries grossly patent to their distal aspects. M1 segments grossly patent bilaterally. Distal MCA branches well perfused and symmetric. POSTERIOR CIRCULATION: Markedly diminutive vertebrobasilar system, not well assessed on this motion degraded exam. Left vertebral artery slightly dominant and may terminate in PICA. Proximal and mid basilar artery markedly diminutive and not well seen. 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 Superior cerebral arteries not well assessed on this examination. Both of the posterior cerebral arteries grossly patent to their distal aspects. No obvious intracranial aneurysm. IMPRESSION: MRI HEAD IMPRESSION: 1. Severely motion degraded exam. 2. No acute intracranial abnormality identified. 3. Left PCA territory infarct, largely chronic in appearance by MRI. 4. Underlying advanced cerebral atrophy with chronic small vessel ischemic disease. 5. Volume loss within the substantia nigra, compatible with history of Parkinson's disease. MRA HEAD IMPRESSION: 1. Severely motion degraded exam. 2. No large vessel occlusion or obvious flow-limiting stenosis. 3. Persistent right-sided trigeminal artery supplying the distal  basilar artery, with markedly diminutive vertebrobasilar system proximally. Electronically Signed   By: Rise Mu M.D.   On: 07/21/2018 16:29   Vas US Carotid (at [facility])  Result Date: 07/21/2018 Carotid Arterial Duplex Study Indications: CVA. Performing Technologist: Toma Deiters RVS  Examination Guidelines: A complete evaluation includes B-mode imaging, spectral Doppler, color Doppler, and power Doppler as needed of all accessible portions of each vessel. Bilateral testing is considered an integral part of a complete examination. Limited examinations for reoccurring indications may be performed as noted.  Right Carotid Findings: +----------+-------+-------+--------+------------+-----------------------------+           PSV    EDV    StenosisDescribe    Comments  cm/s   cm/s                                                     +----------+-------+-------+--------+------------+-----------------------------+ CCA Prox  57     8                          tortuous                      +----------+-------+-------+--------+------------+-----------------------------+ CCA Distal63     13             heterogenousmild plaque with intimal                                                  changes                       +----------+-------+-------+--------+------------+-----------------------------+ ICA Prox  81     20     1-39%   heterogenousmild plaque with intimal                                                  changes                       +----------+-------+-------+--------+------------+-----------------------------+ ICA Mid   86     21                                                       +----------+-------+-------+--------+------------+-----------------------------+ ICA Distal77     17                         tortuous                       +----------+-------+-------+--------+------------+-----------------------------+ ECA       93     9              heterogenousmild plaque at the origin     +----------+-------+-------+--------+------------+-----------------------------+ +----------+--------+-------+--------+-------------------+           PSV cm/sEDV cmsDescribeArm Pressure (mmHG) +----------+--------+-------+--------+-------------------+ [id number]                                         +----------+--------+-------+--------+-------------------+ +---------+--------+--+--------+-+ VertebralPSV cm/s31EDV cm/s6 +---------+--------+--+--------+-+  Left Carotid Findings: +----------+--------+--------+--------+--------------------+-------------------+           PSV cm/sEDV cm/sStenosisDescribe            Comments            +----------+--------+--------+--------+--------------------+-------------------+ CCA Prox  71      14  tortuous            +----------+--------+--------+--------+--------------------+-------------------+ CCA Distal53      11                                  mild intimal                                                              changes             +----------+--------+--------+--------+--------------------+-------------------+ ICA Prox  121     26      1-39%   irregular and       mild to moderate                                      heterogenous        plaque with                                                               acoustic shadowing  +----------+--------+--------+--------+--------------------+-------------------+ ICA Mid   118     29              heterogenous and    minimal plaque on                                     focal               the far wall        +----------+--------+--------+--------+--------------------+-------------------+ ICA Distal92      24                                   tortuous            +----------+--------+--------+--------+--------------------+-------------------+ ECA       217     10              heterogenous        mild to moderaet                                                          plaque with                                                               tortuosity at the  origin              +----------+--------+--------+--------+--------------------+-------------------+ +----------+--------+--------+--------+-------------------+ SubclavianPSV cm/sEDV cm/sDescribeArm Pressure (mmHG) +----------+--------+--------+--------+-------------------+           163                                         +----------+--------+--------+--------+-------------------+ +---------+--------+--+--------+-+ VertebralPSV cm/s52EDV cm/s9 +---------+--------+--+--------+-+  Summary: Right Carotid: Velocities in the right ICA are consistent with a 1-39% stenosis. Left Carotid: Velocities in the left ICA are consistent with a 1-39% stenosis. Vertebrals:  Bilateral vertebral arteries demonstrate antegrade flow. Subclavians: Normal flow hemodynamics were seen in bilateral subclavian              arteries. *See table(s) above for measurements and observations.  Electronically signed by Delia HeadyPramod Sethi MD on 07/21/2018 at 4:08:14 PM.    Final    Vas [place] Lower Extremity Venous (dvt)  Result Date: 07/21/2018  Lower Venous Study Risk Factors: Embolic stroke. Performing Technologist: Toma DeitersVirginia Slaughter RVS  Examination Guidelines: A complete evaluation includes B-mode imaging, spectral Doppler, color Doppler, and power Doppler as needed of all accessible portions of each vessel. Bilateral testing is considered an integral part of a complete examination. Limited examinations for reoccurring indications may be performed as noted.   +---------+---------------+---------+-----------+----------+-------------------+ RIGHT    CompressibilityPhasicitySpontaneityPropertiesSummary             +---------+---------------+---------+-----------+----------+-------------------+ CFV      Full           Yes      Yes                                      +---------+---------------+---------+-----------+----------+-------------------+ SFJ      Full                                                             +---------+---------------+---------+-----------+----------+-------------------+ FV Prox  Full           Yes      Yes                                      +---------+---------------+---------+-----------+----------+-------------------+ FV Mid   Full                                                             +---------+---------------+---------+-----------+----------+-------------------+ FV DistalFull           Yes      Yes                                      +---------+---------------+---------+-----------+----------+-------------------+ PFV      Full           Yes      Yes                                      +---------+---------------+---------+-----------+----------+-------------------+  POP      Full           Yes      Yes                                      +---------+---------------+---------+-----------+----------+-------------------+ PTV      Full                                         Technically                                                               difficult due to                                                          constant tremors    +---------+---------------+---------+-----------+----------+-------------------+ PERO     Full                                         Technically                                                               difficult due to                                                          constant temors      +---------+---------------+---------+-----------+----------+-------------------+   Right Technical Findings: Technically difficult to image the veins of the lower leg due to constant tremors.  +---------+---------------+---------+-----------+----------+-------------------+ LEFT     CompressibilityPhasicitySpontaneityPropertiesSummary             +---------+---------------+---------+-----------+----------+-------------------+ CFV      Full           Yes      Yes                                      +---------+---------------+---------+-----------+----------+-------------------+ SFJ      Full                                                             +---------+---------------+---------+-----------+----------+-------------------+ FV Prox  Full           Yes  Yes                                      +---------+---------------+---------+-----------+----------+-------------------+ FV Mid   Full                                                             +---------+---------------+---------+-----------+----------+-------------------+ FV DistalFull           Yes      Yes                                      +---------+---------------+---------+-----------+----------+-------------------+ PFV      Full           Yes      Yes                                      +---------+---------------+---------+-----------+----------+-------------------+ POP      Full           Yes      Yes                                      +---------+---------------+---------+-----------+----------+-------------------+ PTV      Full                                         Technically                                                               difficult due to                                                          constant tremors    +---------+---------------+---------+-----------+----------+-------------------+ PERO     Full                                          Technically                                                               difficult due to  constant tremors    +---------+---------------+---------+-----------+----------+-------------------+  Left Technical Findings: Tehnically difficult to visualize the veins in the lower leg due to constant tremors   Summary: Right: There is no evidence of deep vein thrombosis in the lower extremity. No cystic structure found in the popliteal fossa. See technical findings listed above. Left: There is no evidence of deep vein thrombosis in the lower extremity. No cystic structure found in the popliteal fossa. See technical findings listed above.  *See table(s) above for measurements and observations. Electronically signed by Coral Else MD on 07/21/2018 at 2:51:43 PM.    Final     PHYSICAL EXAM  Temp:  [97.6 F (36.4 C)-98.5 F (36.9 C)] 98.1 F (36.7 C) (05/20 1100) Pulse Rate:  [75-90] 90 (05/20 1100) Resp:  [16-18] 17 (05/20 1100) BP: (114-144)/(67-73) 114/67 (05/20 1100) SpO2:  [94 %-99 %] 98 % (05/20 1100)  General - Well nourished, well developed, in no apparent distress.  Ophthalmologic - fundi not visualized due to noncooperation.  Cardiovascular - Regular rate and rhythm.  Neuro - awake alert, orientated to place and year, but not to month or people. Severe dysarthria and significant bradykinesia with psychomotor slowing, paucity of speech, follows limited simple commands. Not cooperative on naming or repetition. PERRL, blinking to visual threat bilaterally, EOMI, tracking bilaterally. Right nasolabial fold flattening. Masked face. Tongue protrusion not cooperative. Increased muscle tone on the RUE and BLEs, minimal spontaneous movement but withdraw to pain. LUE in sling, not able to test due to fracture. B/l UEs resting tremor. Sensation, coordination not cooperative and gait not tested.    ASSESSMENT/PLAN Deanna Page is a 85 y.o. female with history of TIA, Parkinson's disease, CKD stage III, MDD, hypothyroidism and dementia presenting to the ED following unwitnessed fall at SNF with left humerus fracture and CT showing subacute left PCA infarct.   Fall with left humerus fracture   Orthopedic surgery on board  Plan for surgery soon  Pt left UE weakness initially seen likely due to fracture  Old left PCA infarct  Embolic pattern unclear source   CT head /CT cervical spine - L PCA territory infarct.  Proximal left humerus fracture but no head or spine trauma.   MRI no acute infarct.  Chronic L PCA infarct. Volume loss within substantia nigra C/W Parkinson's  MRA no LVO.  Persistent right trigeminal artery supplying basilar with diminutive VB system proximally  Carotid Doppler  B ICA 1-39% stenosis, VAs antegrade.  Normal flow insubclavian's.  Lower extremity Doppler no DVT  2D Echo EF 55-60%  LDL 75  HgbA1c 5.5  SCDs for VTE prophylaxis  Diet-n.p.o.  No antithrombotic prior to admission, now on aspirin 300 mg suppository daily or aspirin 325 mg p.o. daily.   Therapy recommendations:  Return to SNF [facility])  Disposition: Pending  Parkinson disease with dementia  On sinemet  [facility]  Baseline PD dementia  In-hospital delirium / baseline dementia requiring SNF placement and assistance with ADLs  On Namenda   Fall precautions  Hypertension  Stable  Blood pressure goal normotensive   Hyperlipidemia  Home meds: No statin  Now on pravastatin 20 mg daily  LDL 75, goal < 70  Continue statin at discharge  UTI  UA WBC > 50  Leukocytosis 13.6->9.8  Tmax 100.0->afebrile  Treatment per primary team  Other Stroke Risk Factors  Advanced age  Hx stroke/TIA  Family hx stroke (mother)  Obstructive sleep apnea  Other Active Problems  In-hospital delirium / baseline dementia requiring SNF placement and assistance with  ADLs  CKD stage III Cre 1.53->1.41  Hypothyroidism  Hospital day # 1  Neurology will sign off. Please call with questions. Pt will follow up with Dr. Frances Furbish at [facility] in about 4 weeks. Thanks for the consult.   Marvel Plan, MD PhD Stroke Neurology 07/22/2018 4:30 PM    To contact Stroke Continuity provider, please refer to [email or web address]. After hours, contact General Neurology

## 2018-07-22 NOTE — Progress Notes (Signed)
   Subjective: No acute events overnight. Deanna Page had pain in her left arm, but otherwise was without any complaints. She is soft spoken, but speaking coherently and answering questions appropriately.   Objective:  Vital signs in last 24 hours: Vitals:   07/21/18 1958 07/22/18 0025 07/22/18 0510 07/22/18 0710  BP: 124/73 116/67 115/67 134/72  Pulse: 85 82 75 75  Resp: 16 18 18 18   Temp: 98.2 F (36.8 C) 98 F (36.7 C) 98 F (36.7 C) 98.5 F (36.9 C)  TempSrc: Oral Oral Oral Oral  SpO2: 96% 97% 98% 99%  Weight:      Height:       General: elderly female with masked facies, lying in bed in NAD Neuro: A&Ox3; answers questions appropriately    Assessment/Plan:  Principal Problem:   Acute ischemic left PCA stroke (HCC) Active Problems:   Essential hypertension   Parkinson's disease (HCC)   Closed fracture of proximal end of left humerus, initial encounter   Hypothyroidism   CKD (chronic kidney disease), stage III (HCC)  Deanna Page is an 84 y/o female with h/o Parkinson's Disease, CKD III, hypothyroidism who presented from her nursing facility after an unwitnessed fall. Found to have displaced left humeral fracture which will need surgical intervention. Neurology was also consulted due to CT head findings of PCA stroke of indeterminate chronicity. MRI confirmed that this was a chronic finding.   Displaced left humeral fracture - greatly appreciate orthopedic consult; planning on ORIF - given that this is a fragility fracture will start bisphosphonate post-operatively  - PT/OT post-operatively  - NPO for surgery   Chronic PCA infarct - stroke work-up initiated on admission due to findings on initial head CT; MRI later confirmed that this was a chronic finding  - carotid dopplers without significant obstruction  - lower extremity dopplers without evidence of DVT - echo pending - continue aspirin and high intensity statin - patient has chronic dysphagia at baseline;  SLP recommended MBS to further evaluate; f/u additional diet recs  AKI on CKD III - crt improving on IVF; will continue NS at 85 cc/hr while she remains NPO  Parkinson's disease Dementia  - continue home Sinemet and Namenda   Depression, anxiety - stable on Celexa, perphenazine, klonopin  Dispo: Anticipated discharge pending post-op recovery from surgical intervention.   Lenward Chancellor D, DO 07/22/2018, 10:26 AM Pager: [phone]

## 2018-07-22 NOTE — Consult Note (Signed)
Reason for Consult:Left humerus fx Referring Physician: [name] is an 85 y.o. female.  HPI: Deanna Page fell [facility] at the [facility]. The fall was unwitnessed. She was found lying on her left side. She c/o pain in her left shoulder and was brought to the ED for evaluation. X-rays showed a left proximal humerus fx and orthopedic surgery was consulted the following day. She continues to c/o left shoulder pain. She tells me that she would like to have surgery on it to fix it if possible. She is LHD.  Past Medical History:  Diagnosis Date  . Allergic rhinitis   . Anxiety   . Asthma   . Bowel obstruction (HCC)   . CKD (chronic kidney disease), stage III (HCC)   . Dementia (HCC)   . Diverticulosis   . Esophageal dysmotility   . Esophageal stricture 2005  . GERD (gastroesophageal reflux disease)   . Hepatitis A   . Hiatal hernia   . Hypertension   . Hypothyroidism   . Internal hemorrhoids   . Irritable bowel syndrome   . Ischemic colitis, enteritis, or enterocolitis (HCC) 2010  . Major depression    with paranoia and insomnia  . OSA (obstructive sleep apnea)   . Parkinson disease (HCC)   . Pneumonia   . TIA (transient ischemic attack)   . Urinary incontinence   . Vitamin D deficiency     Past Surgical History:  Procedure Laterality Date  . ABDOMINAL HYSTERECTOMY  1969  . APPENDECTOMY  1969  . BLADDER SURGERY    . BREAST LUMPECTOMY  1995  . CATARACT EXTRACTION    . CHOLECYSTECTOMY  2000  . CORNEAL TRANSPLANT Left   . INTRAOCULAR PROSTHESES INSERTION     Left eye at [facility]  . TUBAL LIGATION  1968    Family History  Problem Relation Age of Onset  . Stroke Mother   . Parkinson's disease Mother   . Lung cancer Father   . ALS Sister   . Liver disease Sister   . Parkinson's disease Maternal Grandmother   . Parkinson's disease Maternal Aunt   . Emphysema Sister   . Emphysema Brother   . Colon cancer Neg Hx   . Colon polyps Neg Hx   .  Diabetes Neg Hx   . Gallbladder disease Neg Hx   . Esophageal cancer Neg Hx     Social History:  reports that she has never smoked. She has never used smokeless tobacco. She reports that she does not drink alcohol or use drugs.  Allergies:  Allergies  Allergen Reactions  . Erythromycin     Other reaction(s): Other (See Comments) Other Reaction: GI Upset Unknown reaction  . Penicillins Rash and Shortness Of Breath    Unknown reaction Has patient had a PCN reaction causing immediate rash, facial/tongue/throat swelling, SOB or lightheadedness with hypotension: Unknown Has patient had a PCN reaction causing severe rash involving mucus membranes or skin necrosis: Unknown Has patient had a PCN reaction that required hospitalization: Unknown Has patient had a PCN reaction occurring within the last 10 years: Unknown If all of the above answers are "NO", then may proceed with Cephalosporin use.   Mart Piggs [Donepezil Hcl]     Unknown reaction  . Clindamycin/Lincomycin Itching  . Doxycycline     Unknown reaction  . Pneumococcal Vaccines     Unknown reaction    Medications: I have reviewed the patient's current medications.  Results for orders placed or  performed during the hospital encounter of 07/21/18 (from the past 48 hour(s))  Ethanol     Status: None   Collection Time: 07/21/18  4:05 AM  Result Value Ref Range   Alcohol, Ethyl (B) <10 <10 mg/dL    Comment: (NOTE) Lowest detectable limit for serum alcohol is 10 mg/dL. For medical purposes only. Performed at [facility], [address], [place], [place] [address]   CBC     Status: Abnormal   Collection Time: 07/21/18  4:05 AM  Result Value Ref Range   WBC 13.6 (H) 4.0 - 10.5 K/uL   RBC 3.83 (L) 3.87 - 5.11 MIL/uL   Hemoglobin 11.7 (L) 12.0 - 15.0 g/dL   HCT 10.2 72.5 - 36.6 %   MCV 96.1 80.0 - 100.0 fL   MCH 30.5 26.0 - 34.0 pg   MCHC 31.8 30.0 - 36.0 g/dL   RDW 44.0 34.7 - 42.5 %   Platelets 183 150 - 400 K/uL    nRBC 0.0 0.0 - 0.2 %    Comment: Performed at [facility], [address], [place], [place] [address]  Differential     Status: Abnormal   Collection Time: 07/21/18  4:05 AM  Result Value Ref Range   Neutrophils Relative % 81 %   Neutro Abs 10.9 (H) 1.7 - 7.7 K/uL   Lymphocytes Relative 9 %   Lymphs Abs 1.2 0.7 - 4.0 K/uL   Monocytes Relative 8 %   Monocytes Absolute 1.1 (H) 0.1 - 1.0 K/uL   Eosinophils Relative 1 %   Eosinophils Absolute 0.2 0.0 - 0.5 K/uL   Basophils Relative 1 %   Basophils Absolute 0.1 0.0 - 0.1 K/uL   Immature Granulocytes 0 %   Abs Immature Granulocytes 0.05 0.00 - 0.07 K/uL    Comment: Performed at [facility], [address], [place], [place] [address]  Comprehensive metabolic panel     Status: Abnormal   Collection Time: 07/21/18  4:05 AM  Result Value Ref Range   Sodium 142 135 - 145 mmol/L   Potassium 4.1 3.5 - 5.1 mmol/L   Chloride 106 98 - 111 mmol/L   CO2 22 22 - 32 mmol/L   Glucose, Bld 122 (H) 70 - 99 mg/dL   BUN 31 (H) 8 - 23 mg/dL   Creatinine, Ser 3.29 (H) 0.44 - 1.00 mg/dL   Calcium 9.8 8.9 - 51.8 mg/dL   Total Protein 7.0 6.5 - 8.1 g/dL   Albumin 3.8 3.5 - 5.0 g/dL   AST 26 15 - 41 U/L   ALT 7 0 - 44 U/L   Alkaline Phosphatase 80 38 - 126 U/L   Total Bilirubin 0.6 0.3 - 1.2 mg/dL   GFR calc non Af Amer 30 (L) >60 mL/min   GFR calc Af Amer 35 (L) >60 mL/min   Anion gap 14 5 - 15    Comment: Performed at [facility], [address], [place], [place] [address]  Urine rapid drug screen (hosp performed)     Status: None   Collection Time: 07/21/18  4:05 AM  Result Value Ref Range   Opiates NONE DETECTED NONE DETECTED   Cocaine NONE DETECTED NONE DETECTED   Benzodiazepines NONE DETECTED NONE DETECTED   Amphetamines NONE DETECTED NONE DETECTED   Tetrahydrocannabinol NONE DETECTED NONE DETECTED   Barbiturates NONE DETECTED NONE DETECTED    Comment: (NOTE) DRUG SCREEN FOR MEDICAL PURPOSES ONLY.  IF CONFIRMATION IS  NEEDED FOR ANY PURPOSE, NOTIFY LAB WITHIN 5 DAYS. LOWEST DETECTABLE LIMITS FOR URINE DRUG SCREEN Drug Class                     Cutoff (ng/mL) Amphetamine and metabolites    1000 Barbiturate and metabolites    200 Benzodiazepine                 200 Tricyclics and metabolites     300 Opiates and metabolites        300 Cocaine and metabolites        300 THC                            50 Performed at [facility], [address], [place], [place] [address]   Urinalysis, Routine w reflex microscopic     Status: Abnormal   Collection Time: 07/21/18  4:05 AM  Result Value Ref Range   Color, Urine YELLOW YELLOW   APPearance HAZY (A) CLEAR   Specific Gravity, Urine 1.017 1.005 - 1.030   pH 5.0 5.0 - 8.0   Glucose, UA NEGATIVE NEGATIVE mg/dL   Hgb urine dipstick NEGATIVE NEGATIVE   Bilirubin Urine NEGATIVE NEGATIVE   Ketones, ur 5 (A) NEGATIVE mg/dL   Protein, ur NEGATIVE NEGATIVE mg/dL   Nitrite NEGATIVE NEGATIVE   Leukocytes,Ua SMALL (A) NEGATIVE   RBC / HPF 6-10 0 - 5 RBC/hpf   WBC, UA >50 (H) 0 - 5 WBC/hpf   Bacteria, UA NONE SEEN NONE SEEN    Comment: Performed at [facility], [address] [place], [place] [address]  Protime-INR     Status: None   Collection Time: 07/21/18  4:30 AM  Result Value Ref Range   Prothrombin Time 13.5 11.4 - 15.2 seconds   INR 1.0 0.8 - 1.2    Comment: (NOTE) INR goal varies based on device and disease states. Performed at [facility], [address] [place], [place] [address]   APTT     Status: None   Collection Time: 07/21/18  4:30 AM  Result Value Ref Range   aPTT 25 24 - 36 seconds    Comment: Performed at [facility], [address], [place], [place] [address]  SARS Coronavirus 2 (CEPHEID - Performed in [facility] hospital lab), Hosp Order     Status: None   Collection Time: 07/21/18  3:06 PM  Result Value Ref Range   SARS Coronavirus 2 NEGATIVE NEGATIVE    Comment: (NOTE) If result is  NEGATIVE SARS-CoV-2 target nucleic acids are NOT DETECTED. The SARS-CoV-2 RNA is generally detectable in upper and lower  respiratory specimens during the acute phase of infection. The lowest  concentration of SARS-CoV-2 viral copies this assay can detect is 250  copies / mL. A negative result does not preclude SARS-CoV-2 infection  and should not be used as the sole basis for treatment or other  patient management decisions.  A negative result may occur with  improper specimen collection / handling, submission of specimen other  than nasopharyngeal swab, presence of viral mutation(s) within the  areas targeted by this assay, and inadequate number of viral copies  (<250 copies / mL). A negative result must be combined with clinical  observations, patient history, and epidemiological information. If result is POSITIVE SARS-CoV-2 target nucleic acids are DETECTED. The SARS-CoV-2 RNA is generally detectable in upper and lower  respiratory specimens dur ing the acute phase of infection.  Positive  results are indicative of active infection with SARS-CoV-2.  Clinical  correlation with patient history and other diagnostic information is  necessary to determine patient infection status.  Positive results do  not rule out bacterial infection or co-infection with other viruses. If result is PRESUMPTIVE POSTIVE SARS-CoV-2 nucleic acids MAY BE PRESENT.   A presumptive positive result was obtained on the submitted specimen  and confirmed on repeat testing.  While 2019 novel coronavirus  (SARS-CoV-2) nucleic acids may be present in the submitted sample  additional confirmatory testing may be necessary for epidemiological  and / or clinical management purposes  to differentiate between  SARS-CoV-2 and other Sarbecovirus currently known to infect humans.  If clinically indicated additional testing with an alternate test  methodology [phone]) is advised. The SARS-CoV-2 RNA is generally  detectable  in upper and lower respiratory sp ecimens during the acute  phase of infection. The expected result is Negative. Fact Sheet for Patients:  BoilerBrush.com.cy Fact Sheet for Healthcare Providers: https://pope.com/ This test is not yet approved or cleared by the [place] FDA and has been authorized for detection and/or diagnosis of SARS-CoV-2 by FDA under an Emergency Use Authorization (EUA).  This EUA will remain in effect (meaning this test can be used) for the duration of the COVID-19 declaration under Section 564(b)(1) of the Act, 21 U.S.C. section 360bbb-3(b)(1), unless the authorization is terminated or revoked sooner. Performed at [facility], [address], [place], [place] [address]   MRSA PCR Screening     Status: None   Collection Time: 07/21/18  3:08 PM  Result Value Ref Range   MRSA by PCR NEGATIVE NEGATIVE    Comment:        The GeneXpert MRSA Assay (FDA approved for NASAL specimens only), is one component of a comprehensive MRSA colonization surveillance program. It is not intended to diagnose MRSA infection nor to guide or monitor treatment for MRSA infections. Performed at [facility], [address] [place], [place] [address]   Hemoglobin A1c     Status: None   Collection Time: 07/22/18  4:23 AM  Result Value Ref Range   Hgb A1c MFr Bld 5.5 4.8 - 5.6 %    Comment: (NOTE) Pre diabetes:          5.7%-6.4% Diabetes:              >6.4% Glycemic control for   <7.0% adults with diabetes    Mean Plasma Glucose 111.15 mg/dL    Comment: Performed at [facility], [address] [place], [place] [address]  Lipid panel     Status: None   Collection Time: 07/22/18  4:23 AM  Result Value Ref Range   Cholesterol 156 0 - 200 mg/dL   Triglycerides 52 <784 mg/dL   HDL 71 >69 mg/dL   Total CHOL/HDL Ratio 2.2 RATIO   VLDL 10 0 - 40 mg/dL   LDL Cholesterol 75 0 - 99 mg/dL    Comment:         Total Cholesterol/HDL:CHD Risk Coronary Heart Disease Risk Table                     Men   Women  1/2 Average Risk   3.4   3.3  Average Risk       5.0   4.4  2 X Average Risk   9.6  7.1  3 X Average Risk  23.4   11.0        Use the calculated Patient Ratio above and the CHD Risk Table to determine the patient's CHD Risk.        ATP III CLASSIFICATION (LDL):  <100     mg/dL   Optimal  161-096  mg/dL   Near or Above                    Optimal  130-159  mg/dL   Borderline  045-409  mg/dL   High  >811     mg/dL   Very High Performed at [facility] Lab, [address], [place], [place] [address]   CBC     Status: Abnormal   Collection Time: 07/22/18  4:23 AM  Result Value Ref Range   WBC 9.8 4.0 - 10.5 K/uL   RBC 3.19 (L) 3.87 - 5.11 MIL/uL   Hemoglobin 9.8 (L) 12.0 - 15.0 g/dL   HCT 29.5 (L) 62.1 - 30.8 %   MCV 96.2 80.0 - 100.0 fL   MCH 30.7 26.0 - 34.0 pg   MCHC 31.9 30.0 - 36.0 g/dL   RDW 65.7 84.6 - 96.2 %   Platelets 142 (L) 150 - 400 K/uL   nRBC 0.0 0.0 - 0.2 %    Comment: Performed at [facility], [address], [place], [place] [address]  Basic metabolic panel     Status: Abnormal   Collection Time: 07/22/18  4:23 AM  Result Value Ref Range   Sodium 139 135 - 145 mmol/L   Potassium 3.8 3.5 - 5.1 mmol/L   Chloride 104 98 - 111 mmol/L   CO2 25 22 - 32 mmol/L   Glucose, Bld 110 (H) 70 - 99 mg/dL   BUN 33 (H) 8 - 23 mg/dL   Creatinine, Ser 1.32 (H) 0.44 - 1.00 mg/dL   Calcium 9.3 8.9 - 44.0 mg/dL   GFR calc non Af Amer 34 (L) >60 mL/min   GFR calc Af Amer 39 (L) >60 mL/min   Anion gap 10 5 - 15    Comment: Performed at [facility], [address] [place], [place] [address]    Ct Head Wo Contrast  Result Date: 07/21/2018 CLINICAL DATA:  85 year old female unwitnessed fall. EXAM: CT HEAD WITHOUT CONTRAST CT CERVICAL SPINE WITHOUT CONTRAST TECHNIQUE: Multidetector CT imaging of the head and cervical spine was performed following the standard  protocol without intravenous contrast. Multiplanar CT image reconstructions of the cervical spine were also generated. COMPARISON:  Head CT 12/30/2016 and earlier. FINDINGS: CT HEAD FINDINGS Brain: Confluent hypodensity in the left occipital lobe affecting gray and white matter most resembles an acute to subacute infarct (series 3, image 16). Possible mild petechial hemorrhage but no malignant hemorrhagic transformation. No significant mass effect. Stable gray-white matter differentiation elsewhere with confluent white matter hypodensity. No other intracranial blood products. Vascular: Calcified atherosclerosis at the skull base. No suspicious intracranial vascular hyperdensity. Skull: No acute fracture identified. Sinuses/Orbits: New right mastoid effusion since 2018, sparing the mastoid antrum. The right tympanic cavity remains clear. The left tympanic cavity remains clear but there is also mild new left mastoid fluid. Visible paranasal sinuses are stable in clear. Other: No scalp hematoma identified. Negative orbits. CT CERVICAL SPINE FINDINGS Alignment: Chronic straightening of cervical lordosis. Cervicothoracic junction alignment is within normal limits. Bilateral posterior element alignment is within normal limits. Skull base and vertebrae: Stable skull  base and craniocervical junction alignment but severe chronic left C1-occipital condyle degeneration. 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 Superimposed congenital incomplete segmentation of C2-C3, and ankylosis of the left C3-C4 posterior elements. Superimposed right side C1-C2 joint degeneration also. No acute osseous abnormality identified. Soft tissues and spinal canal: No prevertebral fluid or swelling. No visible canal hematoma. Disc levels: Advanced degenerative change from the skull base to C2 as stated above. Incomplete segmentation of C2-C3 as stated above as well as left C3-C4 and also C5-C6 posterior element ankylosis. Upper  chest: Visible upper thoracic levels appear grossly intact. Negative visible lung apices. Other: There is a proximal left humerus fracture visible on the scout view. IMPRESSION: 1. Acute to subacute appearing Left PCA territory infarct. Possible associated Petechial Hemorrhage but no malignant hemorrhagic transformation. No intracranial mass effect. 2. No superimposed acute traumatic injury identified in the head or cervical spine. Proximal left humerus fracture is visible on the scout view. 3. 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 Electronically Signed   By: Odessa Fleming M.D.   On: 07/21/2018 03:53   Ct Cervical Spine Wo Contrast  Result Date: 07/21/2018 CLINICAL DATA:  84 year old female unwitnessed fall. EXAM: CT HEAD WITHOUT CONTRAST CT CERVICAL SPINE WITHOUT CONTRAST TECHNIQUE: Multidetector CT imaging of the head and cervical spine was performed following the standard protocol without intravenous contrast. Multiplanar CT image reconstructions of the cervical spine were also generated. COMPARISON:  Head CT 12/30/2016 and earlier. FINDINGS: CT HEAD FINDINGS Brain: Confluent hypodensity in the left occipital lobe affecting gray and white matter most resembles an acute to subacute infarct (series 3, image 16). Possible mild petechial hemorrhage but no malignant hemorrhagic transformation. No significant mass effect. Stable gray-white matter differentiation elsewhere with confluent white matter hypodensity. No other intracranial blood products. Vascular: Calcified atherosclerosis at the skull base. No suspicious intracranial vascular hyperdensity. Skull: No acute fracture identified. Sinuses/Orbits: New right mastoid effusion since 2018, sparing the mastoid antrum. The right tympanic cavity remains clear. The left tympanic cavity remains clear but there is also mild new left mastoid fluid. Visible paranasal sinuses are stable in clear. Other: No  scalp hematoma identified. Negative orbits. CT CERVICAL SPINE FINDINGS Alignment: Chronic straightening of cervical lordosis. Cervicothoracic junction alignment is within normal limits. Bilateral posterior element alignment is within normal limits. Skull base and vertebrae: Stable skull base and craniocervical junction alignment but severe chronic left C1-occipital condyle degeneration. 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 Superimposed congenital incomplete segmentation of C2-C3, and ankylosis of the left C3-C4 posterior elements. Superimposed right side C1-C2 joint degeneration also. No acute osseous abnormality identified. Soft tissues and spinal canal: No prevertebral fluid or swelling. No visible canal hematoma. Disc levels: Advanced degenerative change from the skull base to C2 as stated above. Incomplete segmentation of C2-C3 as stated above as well as left C3-C4 and also C5-C6 posterior element ankylosis. Upper chest: Visible upper thoracic levels appear grossly intact. Negative visible lung apices. Other: There is a proximal left humerus fracture visible on the scout view. IMPRESSION: 1. Acute to subacute appearing Left PCA territory infarct. Possible associated Petechial Hemorrhage but no malignant hemorrhagic transformation. No intracranial mass effect. 2. No superimposed acute traumatic injury identified in the head or cervical spine. Proximal left humerus fracture is visible on the scout view. 3. 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 Electronically Signed   By: Odessa Fleming M.D.   On: 07/21/2018 03:53  Mr Brain Wo Contrast  Result Date: 07/21/2018 CLINICAL DATA:  Initial evaluation for acute altered mental status, unwitnessed fall, left upper extremity weakness. EXAM: MRI HEAD WITHOUT CONTRAST MRA HEAD WITHOUT CONTRAST TECHNIQUE: Multiplanar, multiecho pulse sequences of the brain and surrounding structures  were obtained without intravenous contrast. Angiographic images of the head were obtained using MRA technique without contrast. COMPARISON:  Prior CT from earlier the same day. FINDINGS: MRI HEAD FINDINGS Brain: Examination severely degraded by motion artifact. Generalized age-related cerebral atrophy with advanced chronic microvascular ischemic disease. Volume loss within the substantia nigra compatible with history of Parkinson's disease, grossly similar. Chronic microvascular ischemic changes noted within the pons as well. Encephalomalacia within the parasagittal left occipital lobe compatible with left PCA territory infarct. This is largely chronic in appearance by MRI with resolved diffusion abnormality. No abnormal foci of restricted diffusion to suggest acute or subacute ischemia. Gray-white matter differentiation otherwise maintained. No other areas of chronic cortical infarction. No acute or chronic intracranial hemorrhage. No visible mass lesion, mass effect, or midline shift on this motion degraded exam. Diffuse ventricular prominence related to global parenchymal volume loss without hydrocephalus. No extra-axial fluid collection. Vascular: Major intracranial vascular flow voids grossly maintained at the skull base. Diminutive vertebrobasilar system. Skull and upper cervical spine: Craniocervical junction grossly within normal limits. No obvious focal marrow replacing lesion. No appreciable scalp soft tissue abnormality. Sinuses/Orbits: Globes and orbital soft tissues demonstrate no acute finding. Paranasal sinuses are clear. Small bilateral mastoid effusions, of doubtful significance. Inner ear structures grossly normal. Other: None. MRA HEAD FINDINGS ANTERIOR CIRCULATION: Examination severely degraded by motion artifact. Visualized distal cervical segments of the internal carotid arteries are patent with antegrade flow. Normal flow related signal seen throughout the petrous, cavernous, and supraclinoid  ICAs. No obvious flow-limiting stenosis. 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 No definite associated aneurysm on this motion degraded exam. A1 segments patent. Anterior cerebral arteries grossly patent to their distal aspects. M1 segments grossly patent bilaterally. Distal MCA branches well perfused and symmetric. POSTERIOR CIRCULATION: Markedly diminutive vertebrobasilar system, not well assessed on this motion degraded exam. Left vertebral artery slightly dominant and may terminate in PICA. Proximal and mid basilar artery markedly diminutive and not well seen. 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 Superior cerebral arteries not well assessed on this examination. Both of the posterior cerebral arteries grossly patent to their distal aspects. No obvious intracranial aneurysm. IMPRESSION: MRI HEAD IMPRESSION: 1. Severely motion degraded exam. 2. No acute intracranial abnormality identified. 3. Left PCA territory infarct, largely chronic in appearance by MRI. 4. Underlying advanced cerebral atrophy with chronic small vessel ischemic disease. 5. Volume loss within the substantia nigra, compatible with history of Parkinson's disease. MRA HEAD IMPRESSION: 1. Severely motion degraded exam. 2. No large vessel occlusion or obvious flow-limiting stenosis. 3. 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 Electronically Signed   By: Rise Mu M.D.   On: 07/21/2018 16:29   Dg Shoulder Left  Result Date: 07/21/2018 CLINICAL DATA:  85 y/o  F; unwitnessed fall. Left shoulder pain. EXAM: LEFT SHOULDER - 2+ VIEW COMPARISON:  06/20/2016 left shoulder MRI FINDINGS: Acute fracture of the left proximal humeral diaphysis with 1 shaft's width medial displacement of the shaft, 3 cm overriding. No shoulder joint dislocation. IMPRESSION: Acute fracture of the left proximal humeral  diaphysis with 1 shaft's width medial displacement of the shaft, 3 cm overriding. No dislocation. Electronically Signed  By: Mitzi Hansen M.D.   On: 07/21/2018 03:07   Mr Maxine Glenn Head Wo Contrast  Result Date: 07/21/2018 CLINICAL DATA:  Initial evaluation for acute altered mental status, unwitnessed fall, left upper extremity weakness. EXAM: MRI HEAD WITHOUT CONTRAST MRA HEAD WITHOUT CONTRAST TECHNIQUE: Multiplanar, multiecho pulse sequences of the brain and surrounding structures were obtained without intravenous contrast. Angiographic images of the head were obtained using MRA technique without contrast. COMPARISON:  Prior CT from earlier the same day. FINDINGS: MRI HEAD FINDINGS Brain: Examination severely degraded by motion artifact. Generalized age-related cerebral atrophy with advanced chronic microvascular ischemic disease. Volume loss within the substantia nigra compatible with history of Parkinson's disease, grossly similar. Chronic microvascular ischemic changes noted within the pons as well. Encephalomalacia within the parasagittal left occipital lobe compatible with left PCA territory infarct. This is largely chronic in appearance by MRI with resolved diffusion abnormality. No abnormal foci of restricted diffusion to suggest acute or subacute ischemia. Gray-white matter differentiation otherwise maintained. No other areas of chronic cortical infarction. No acute or chronic intracranial hemorrhage. No visible mass lesion, mass effect, or midline shift on this motion degraded exam. Diffuse ventricular prominence related to global parenchymal volume loss without hydrocephalus. No extra-axial fluid collection. Vascular: Major intracranial vascular flow voids grossly maintained at the skull base. Diminutive vertebrobasilar system. Skull and upper cervical spine: Craniocervical junction grossly within normal limits. No obvious focal marrow replacing lesion. No appreciable scalp soft tissue  abnormality. Sinuses/Orbits: Globes and orbital soft tissues demonstrate no acute finding. Paranasal sinuses are clear. Small bilateral mastoid effusions, of doubtful significance. Inner ear structures grossly normal. Other: None. MRA HEAD FINDINGS ANTERIOR CIRCULATION: Examination severely degraded by motion artifact. Visualized distal cervical segments of the internal carotid arteries are patent with antegrade flow. Normal flow related signal seen throughout the petrous, cavernous, and supraclinoid ICAs. No obvious flow-limiting stenosis. 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 No definite associated aneurysm on this motion degraded exam. A1 segments patent. Anterior cerebral arteries grossly patent to their distal aspects. M1 segments grossly patent bilaterally. Distal MCA branches well perfused and symmetric. POSTERIOR CIRCULATION: Markedly diminutive vertebrobasilar system, not well assessed on this motion degraded exam. Left vertebral artery slightly dominant and may terminate in PICA. Proximal and mid basilar artery markedly diminutive and not well seen. 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 Superior cerebral arteries not well assessed on this examination. Both of the posterior cerebral arteries grossly patent to their distal aspects. No obvious intracranial aneurysm. IMPRESSION: MRI HEAD IMPRESSION: 1. Severely motion degraded exam. 2. No acute intracranial abnormality identified. 3. Left PCA territory infarct, largely chronic in appearance by MRI. 4. Underlying advanced cerebral atrophy with chronic small vessel ischemic disease. 5. Volume loss within the substantia nigra, compatible with history of Parkinson's disease. MRA HEAD IMPRESSION: 1. Severely motion degraded exam. 2. No large vessel occlusion or obvious flow-limiting stenosis. 3. 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 Electronically Signed   By: Rise Mu M.D.   On: 07/21/2018 16:29   Vas US Carotid (at [facility])  Result Date: 07/21/2018 Carotid Arterial Duplex Study Indications: CVA. Performing Technologist: Toma Deiters RVS  Examination Guidelines: A complete evaluation includes B-mode imaging, spectral Doppler, color Doppler, and power Doppler as needed of all accessible portions of each vessel. Bilateral testing is considered an integral part of a complete examination. Limited examinations for reoccurring indications may be performed as noted.  Right Carotid Findings: +----------+-------+-------+--------+------------+-----------------------------+           PSV    EDV    StenosisDescribe    Comments                                cm/s   cm/s                                                     +----------+-------+-------+--------+------------+-----------------------------+ CCA Prox  57     8                          tortuous                      +----------+-------+-------+--------+------------+-----------------------------+ CCA Distal63     13             heterogenousmild plaque with intimal                                                  changes                       +----------+-------+-------+--------+------------+-----------------------------+ ICA Prox  81     20     1-39%   heterogenousmild plaque with intimal                                                  changes                       +----------+-------+-------+--------+------------+-----------------------------+ ICA Mid   86     21                                                       +----------+-------+-------+--------+------------+-----------------------------+ ICA Distal77     17                         tortuous                      +----------+-------+-------+--------+------------+-----------------------------+ ECA       93     9               heterogenousmild plaque at the origin     +----------+-------+-------+--------+------------+-----------------------------+ +----------+--------+-------+--------+-------------------+           PSV cm/sEDV cmsDescribeArm Pressure (mmHG) +----------+--------+-------+--------+-------------------+ [id number]                                         +----------+--------+-------+--------+-------------------+ +---------+--------+--+--------+-+ VertebralPSV cm/s31EDV cm/s6 +---------+--------+--+--------+-+  Left Carotid Findings: +----------+--------+--------+--------+--------------------+-------------------+           PSV cm/sEDV cm/sStenosisDescribe  Comments            +----------+--------+--------+--------+--------------------+-------------------+ CCA Prox  71      14                                  tortuous            +----------+--------+--------+--------+--------------------+-------------------+ CCA Distal53      11                                  mild intimal                                                              changes             +----------+--------+--------+--------+--------------------+-------------------+ ICA Prox  121     26      1-39%   irregular and       mild to moderate                                      heterogenous        plaque with                                                               acoustic shadowing  +----------+--------+--------+--------+--------------------+-------------------+ ICA Mid   118     29              heterogenous and    minimal plaque on                                     focal               the far wall        +----------+--------+--------+--------+--------------------+-------------------+ ICA Distal92      24                                  tortuous            +----------+--------+--------+--------+--------------------+-------------------+ ECA       217      10              heterogenous        mild to moderaet                                                          plaque with  tortuosity at the                                                         origin              +----------+--------+--------+--------+--------------------+-------------------+ +----------+--------+--------+--------+-------------------+ SubclavianPSV cm/sEDV cm/sDescribeArm Pressure (mmHG) +----------+--------+--------+--------+-------------------+           163                                         +----------+--------+--------+--------+-------------------+ +---------+--------+--+--------+-+ VertebralPSV cm/s52EDV cm/s9 +---------+--------+--+--------+-+  Summary: Right Carotid: Velocities in the right ICA are consistent with a 1-39% stenosis. Left Carotid: Velocities in the left ICA are consistent with a 1-39% stenosis. Vertebrals:  Bilateral vertebral arteries demonstrate antegrade flow. Subclavians: Normal flow hemodynamics were seen in bilateral subclavian              arteries. *See table(s) above for measurements and observations.  Electronically signed by Delia Heady MD on 07/21/2018 at 4:08:14 PM.    Final    Vas [place] Lower Extremity Venous (dvt)  Result Date: 07/21/2018  Lower Venous Study Risk Factors: Embolic stroke. Performing Technologist: Toma Deiters RVS  Examination Guidelines: A complete evaluation includes B-mode imaging, spectral Doppler, color Doppler, and power Doppler as needed of all accessible portions of each vessel. Bilateral testing is considered an integral part of a complete examination. Limited examinations for reoccurring indications may be performed as noted.  +---------+---------------+---------+-----------+----------+-------------------+ RIGHT    CompressibilityPhasicitySpontaneityPropertiesSummary              +---------+---------------+---------+-----------+----------+-------------------+ CFV      Full           Yes      Yes                                      +---------+---------------+---------+-----------+----------+-------------------+ SFJ      Full                                                             +---------+---------------+---------+-----------+----------+-------------------+ FV Prox  Full           Yes      Yes                                      +---------+---------------+---------+-----------+----------+-------------------+ FV Mid   Full                                                             +---------+---------------+---------+-----------+----------+-------------------+ FV DistalFull           Yes      Yes                                      +---------+---------------+---------+-----------+----------+-------------------+  PFV      Full           Yes      Yes                                      +---------+---------------+---------+-----------+----------+-------------------+ POP      Full           Yes      Yes                                      +---------+---------------+---------+-----------+----------+-------------------+ PTV      Full                                         Technically                                                               difficult due to                                                          constant tremors    +---------+---------------+---------+-----------+----------+-------------------+ PERO     Full                                         Technically                                                               difficult due to                                                          constant temors     +---------+---------------+---------+-----------+----------+-------------------+   Right Technical Findings: Technically difficult to image the veins of the lower leg  due to constant tremors.  +---------+---------------+---------+-----------+----------+-------------------+ LEFT     CompressibilityPhasicitySpontaneityPropertiesSummary             +---------+---------------+---------+-----------+----------+-------------------+ CFV      Full           Yes      Yes                                      +---------+---------------+---------+-----------+----------+-------------------+ SFJ      Full                                                             +---------+---------------+---------+-----------+----------+-------------------+  FV Prox  Full           Yes      Yes                                      +---------+---------------+---------+-----------+----------+-------------------+ FV Mid   Full                                                             +---------+---------------+---------+-----------+----------+-------------------+ FV DistalFull           Yes      Yes                                      +---------+---------------+---------+-----------+----------+-------------------+ PFV      Full           Yes      Yes                                      +---------+---------------+---------+-----------+----------+-------------------+ POP      Full           Yes      Yes                                      +---------+---------------+---------+-----------+----------+-------------------+ PTV      Full                                         Technically                                                               difficult due to                                                          constant tremors    +---------+---------------+---------+-----------+----------+-------------------+ PERO     Full                                         Technically                                                               difficult due to  constant tremors    +---------+---------------+---------+-----------+----------+-------------------+  Left Technical Findings: Tehnically difficult to visualize the veins in the lower leg due to constant tremors   Summary: Right: There is no evidence of deep vein thrombosis in the lower extremity. No cystic structure found in the popliteal fossa. See technical findings listed above. Left: There is no evidence of deep vein thrombosis in the lower extremity. No cystic structure found in the popliteal fossa. See technical findings listed above.  *See table(s) above for measurements and observations. Electronically signed by Coral Else MD on 07/21/2018 at 2:51:43 PM.    Final     Review of Systems  Constitutional: Negative for weight loss.  HENT: Negative for ear discharge, ear pain, hearing loss and tinnitus.   Eyes: Negative for blurred vision, double vision, photophobia and pain.  Respiratory: Negative for cough, sputum production and shortness of breath.   Cardiovascular: Negative for chest pain.  Gastrointestinal: Negative for abdominal pain, nausea and vomiting.  Genitourinary: Negative for dysuria, flank pain, frequency and urgency.  Musculoskeletal: Positive for joint pain (Left shoulder). Negative for back pain, falls, myalgias and neck pain.  Neurological: Negative for dizziness, tingling, sensory change, focal weakness, loss of consciousness and headaches.  Endo/Heme/Allergies: Does not bruise/bleed easily.  Psychiatric/Behavioral: Negative for depression, memory loss and substance abuse. The patient is not nervous/anxious.    Blood pressure 134/72, pulse 75, temperature 98.5 F (36.9 C), temperature source Oral, resp. rate 18, height 5\' 3"  (1.6 m), weight 53.1 kg, SpO2 99 %. Physical Exam  Constitutional: She appears well-developed and well-nourished. No distress.  HENT:  Head: Normocephalic and atraumatic.  Eyes: Conjunctivae are normal. Right eye exhibits no discharge. Left eye  exhibits no discharge. No scleral icterus.  Neck: Normal range of motion.  Cardiovascular: Normal rate and regular rhythm.  Respiratory: Effort normal. No respiratory distress.  Musculoskeletal:     Comments: Left shoulder, elbow, wrist, digits- no skin wounds, TTP shoulder, in sling  Sens  Ax/R/M/U intact  Mot   Ax/ R/ PIN/ M/ AIN/ U intact  Rad 2+  Neurological: She is alert. She displays tremor.  Skin: Skin is warm and dry. She is not diaphoretic.  Psychiatric: She has a normal mood and affect. Her behavior is normal.    Assessment/Plan: Left humerus fx -- Given the amount of displacement and the fact that this is her dominant hand I think would benefit from surgical fixation. Plan ORIF by Dr. August Saucer this afternoon. Continue NPO. Multiple medical problems including Parkinson's disease, memory loss, chronic kidney disease stage III, depression, hypothyroidism, and recurrent UTI -- per primary team    Freeman Caldron, PA-C Orthopedic Surgery [phone] 07/22/2018, 10:26 AM

## 2018-07-22 NOTE — Progress Notes (Signed)
Patient went for modified barium swallow.  Notified PA M. Tinnie Gens patient had swallow at noon, returned to floor at 1230hrs.  Order for NPO for surgery.  Also unable to contact husband for telephone consent for surgery.  Patient oriented to self and place but unable to state reason for surgery or type of surgery planned.  Left message for husband, PA M. Tinnie Gens notified and will contact if able to get consent.

## 2018-07-22 NOTE — Progress Notes (Signed)
Modified Barium Swallow Progress Note  Patient Details  Name: Deanna Page MRN: [id number] Date of Birth: 07/18/1929  Today's Date: 07/22/2018  Modified Barium Swallow completed.  Full report located under Chart Review in the Imaging Section.  Brief recommendations include the following:  Clinical Impression  Pt demonstrated mild oral dysphagia without aspiration. Lingual residue with thin spilled to valleculae and was spontaneously cleared. Deliberate and mildly delayed mastication and transit with solid texture. Pt's timing of swallow and protection of trachea was safe and functional with transient trace flash penetration. Pt is impulsive needing quanity controlled sips. Pt given straw sips water when study completed (no fluoro) exhibted a strong reflexive cough suggestive of penetration/aspiration. Esophageal scan did not reveal abnormalities with informal observation. Recommend Dys 2 texture (ground-minced) and ST advancing when appropriate, thin, cup sips preferred and requires feeding assistant to remove cup after 1-2 sips, crush pills. Will continue to follow while on acute care and recommend follow up at next venue.    Swallow Evaluation Recommendations       SLP Diet Recommendations: Dysphagia 2 (Fine chop) solids;Thin liquid   Liquid Administration via: Cup;No straw   Medication Administration: Crushed with puree   Supervision: Patient able to self feed;Full supervision/cueing for compensatory strategies   Compensations: Slow rate;Small sips/bites;Lingual sweep for clearance of pocketing   Postural Changes: Seated upright at 90 degrees   Oral Care Recommendations: Oral care BID        Royce Macadamia 07/22/2018,3:19 PM  Breck Coons [place].Ed Nurse, children's [phone] Office [phone]

## 2018-07-22 NOTE — Progress Notes (Signed)
Spoke with patient's husband Takema Krampitz, consented to patient's ORIF of left humerus via telephone. Consent in chart.

## 2018-07-23 ENCOUNTER — Inpatient Hospital Stay (HOSPITAL_COMMUNITY): Payer: Medicare (Managed Care)

## 2018-07-23 ENCOUNTER — Inpatient Hospital Stay (HOSPITAL_COMMUNITY): Payer: Medicare (Managed Care) | Admitting: Anesthesiology

## 2018-07-23 ENCOUNTER — Encounter (HOSPITAL_COMMUNITY): Payer: Self-pay

## 2018-07-23 ENCOUNTER — Encounter (HOSPITAL_COMMUNITY)
Admission: EM | Disposition: A | Discharge status: Skilled Nursing Facility | Payer: Self-pay | Source: Skilled Nursing Facility | Attending: Student in an Organized Health Care Education/Training Program

## 2018-07-23 HISTORY — PX: ORIF HUMERUS FRACTURE: SHX2126

## 2018-07-23 LAB — SURGICAL PCR SCREEN
MRSA, PCR: NEGATIVE
Staphylococcus aureus: NEGATIVE

## 2018-07-23 SURGERY — OPEN REDUCTION INTERNAL FIXATION (ORIF) PROXIMAL HUMERUS FRACTURE
Anesthesia: General | Site: Arm Upper | Laterality: Left

## 2018-07-23 MED ORDER — ROCURONIUM BROMIDE 10 MG/ML (PF) SYRINGE
PREFILLED_SYRINGE | INTRAVENOUS | Status: AC
  Filled 2018-07-23: qty 10

## 2018-07-23 MED ORDER — MIDAZOLAM HCL 2 MG/2ML IJ SOLN
INTRAMUSCULAR | Status: AC
  Filled 2018-07-23: qty 2

## 2018-07-23 MED ORDER — SUGAMMADEX SODIUM 200 MG/2ML IV SOLN
INTRAVENOUS | Status: DC | PRN
  Administered 2018-07-23: 110 mg via INTRAVENOUS

## 2018-07-23 MED ORDER — LIDOCAINE 2% (20 MG/ML) 5 ML SYRINGE
INTRAMUSCULAR | Status: DC | PRN
  Administered 2018-07-23: 40 mg via INTRAVENOUS

## 2018-07-23 MED ORDER — ROCURONIUM BROMIDE 50 MG/5ML IV SOSY
PREFILLED_SYRINGE | INTRAVENOUS | Status: DC | PRN
  Administered 2018-07-23: 50 mg via INTRAVENOUS

## 2018-07-23 MED ORDER — PROPOFOL 10 MG/ML IV BOLUS
INTRAVENOUS | Status: DC | PRN
  Administered 2018-07-23: 60 mg via INTRAVENOUS

## 2018-07-23 MED ORDER — PROPOFOL 10 MG/ML IV BOLUS
INTRAVENOUS | Status: AC
  Filled 2018-07-23: qty 20

## 2018-07-23 MED ORDER — PHENYLEPHRINE 40 MCG/ML (10ML) SYRINGE FOR IV PUSH (FOR BLOOD PRESSURE SUPPORT)
PREFILLED_SYRINGE | INTRAVENOUS | Status: DC | PRN
  Administered 2018-07-23 (×2): 120 ug via INTRAVENOUS

## 2018-07-23 MED ORDER — DEXAMETHASONE SODIUM PHOSPHATE 10 MG/ML IJ SOLN
INTRAMUSCULAR | Status: AC
  Filled 2018-07-23: qty 1

## 2018-07-23 MED ORDER — FENTANYL CITRATE (PF) 100 MCG/2ML IJ SOLN: 25.0000 ug | INTRAMUSCULAR | Status: DC | PRN

## 2018-07-23 MED ORDER — FENTANYL CITRATE (PF) 100 MCG/2ML IJ SOLN
INTRAMUSCULAR | Status: DC | PRN
  Administered 2018-07-23: 50 ug via INTRAVENOUS

## 2018-07-23 MED ORDER — PHENYLEPHRINE 40 MCG/ML (10ML) SYRINGE FOR IV PUSH (FOR BLOOD PRESSURE SUPPORT)
PREFILLED_SYRINGE | INTRAVENOUS | Status: AC
  Filled 2018-07-23: qty 10

## 2018-07-23 MED ORDER — LIDOCAINE 2% (20 MG/ML) 5 ML SYRINGE
INTRAMUSCULAR | Status: AC
  Filled 2018-07-23: qty 5

## 2018-07-23 MED ORDER — FENTANYL CITRATE (PF) 100 MCG/2ML IJ SOLN
INTRAMUSCULAR | Status: AC
  Filled 2018-07-23: qty 2

## 2018-07-23 MED ORDER — SUCCINYLCHOLINE CHLORIDE 20 MG/ML IJ SOLN
INTRAMUSCULAR | Status: DC | PRN
  Administered 2018-07-23: 100 mg via INTRAVENOUS

## 2018-07-23 MED ORDER — SODIUM CHLORIDE 0.9 % IV SOLN
INTRAVENOUS | Status: DC | PRN
  Administered 2018-07-23: 30 ug/min via INTRAVENOUS

## 2018-07-23 MED ORDER — DEXTROSE-NACL 5-0.45 % IV SOLN: INTRAVENOUS | Status: AC

## 2018-07-23 MED ORDER — 0.9 % SODIUM CHLORIDE (POUR BTL) OPTIME
TOPICAL | Status: DC | PRN
  Administered 2018-07-23: 1000 mL

## 2018-07-23 MED ORDER — MUPIROCIN 2 % EX OINT
1.0000 "application " | TOPICAL_OINTMENT | Freq: Two times a day (BID) | CUTANEOUS | Status: DC
  Administered 2018-07-23 – 2018-07-25 (×6): 1 via NASAL
  Filled 2018-07-23: qty 22

## 2018-07-23 MED ORDER — SODIUM CHLORIDE 0.9 % IV SOLN
INTRAVENOUS | Status: DC
  Administered 2018-07-23: 11:00:00 via INTRAVENOUS

## 2018-07-23 MED ORDER — VANCOMYCIN HCL IN DEXTROSE 1-5 GM/200ML-% IV SOLN
1000.0000 mg | Freq: Two times a day (BID) | INTRAVENOUS | Status: AC
  Administered 2018-07-23: 1000 mg via INTRAVENOUS
  Filled 2018-07-23: qty 200

## 2018-07-23 MED ORDER — DEXAMETHASONE SODIUM PHOSPHATE 10 MG/ML IJ SOLN
INTRAMUSCULAR | Status: DC | PRN
  Administered 2018-07-23: 10 mg via INTRAVENOUS

## 2018-07-23 MED ORDER — FENTANYL CITRATE (PF) 250 MCG/5ML IJ SOLN
INTRAMUSCULAR | Status: AC
  Filled 2018-07-23: qty 5

## 2018-07-23 SURGICAL SUPPLY — 79 items
APL SKNCLS STERI-STRIP NONHPOA (GAUZE/BANDAGES/DRESSINGS) ×2
BENZOIN TINCTURE PRP APPL 2/3 (GAUZE/BANDAGES/DRESSINGS) ×6 IMPLANT
BIT DRILL 3.2 (BIT) ×3
BIT DRILL 3.2XCALB NS DISP (BIT) ×1 IMPLANT
BIT DRILL CALIBRATED 2.7 (BIT) ×2 IMPLANT
BIT DRILL CALIBRATED 2.7MM (BIT) ×1
BIT DRL 3.2XCALB NS DISP (BIT) ×1
BNDG GAUZE ELAST 4 BULKY (GAUZE/BANDAGES/DRESSINGS) ×6 IMPLANT
BRUSH SCRUB SURG 4.25 DISP (MISCELLANEOUS) ×6 IMPLANT
COVER SURGICAL LIGHT HANDLE (MISCELLANEOUS) IMPLANT
COVER WAND RF STERILE (DRAPES) ×3 IMPLANT
DRAPE C-ARM 42X72 X-RAY (DRAPES) ×3 IMPLANT
DRAPE C-ARMOR (DRAPES) ×3 IMPLANT
DRAPE IMP U-DRAPE 54X76 (DRAPES) ×3 IMPLANT
DRAPE INCISE IOBAN 66X45 STRL (DRAPES) IMPLANT
DRAPE ORTHO SPLIT 77X108 STRL (DRAPES) ×4
DRAPE SURG 17X11 SM STRL (DRAPES) ×6 IMPLANT
DRAPE SURG ORHT 6 SPLT 77X108 (DRAPES) ×2 IMPLANT
DRAPE U-SHAPE 47X51 STRL (DRAPES) ×6 IMPLANT
DRSG ADAPTIC 3X8 NADH LF (GAUZE/BANDAGES/DRESSINGS) ×3 IMPLANT
DRSG MEPILEX BORDER 4X8 (GAUZE/BANDAGES/DRESSINGS) ×3 IMPLANT
DRSG PAD ABDOMINAL 8X10 ST (GAUZE/BANDAGES/DRESSINGS) ×3 IMPLANT
ELECT REM PT RETURN 9FT ADLT (ELECTROSURGICAL) ×3
ELECTRODE REM PT RTRN 9FT ADLT (ELECTROSURGICAL) ×1 IMPLANT
EVACUATOR 1/8 PVC DRAIN (DRAIN) IMPLANT
GAUZE SPONGE 4X4 12PLY STRL (GAUZE/BANDAGES/DRESSINGS) IMPLANT
GLOVE BIO SURGEON STRL SZ7.5 (GLOVE) ×3 IMPLANT
GLOVE BIO SURGEON STRL SZ8 (GLOVE) ×3 IMPLANT
GLOVE BIOGEL PI IND STRL 7.5 (GLOVE) ×1 IMPLANT
GLOVE BIOGEL PI IND STRL 8 (GLOVE) ×1 IMPLANT
GLOVE BIOGEL PI INDICATOR 7.5 (GLOVE) ×2
GLOVE BIOGEL PI INDICATOR 8 (GLOVE) ×2
GOWN STRL REUS W/ TWL LRG LVL3 (GOWN DISPOSABLE) ×2 IMPLANT
GOWN STRL REUS W/ TWL XL LVL3 (GOWN DISPOSABLE) ×2 IMPLANT
GOWN STRL REUS W/TWL 2XL LVL3 (GOWN DISPOSABLE) IMPLANT
GOWN STRL REUS W/TWL LRG LVL3 (GOWN DISPOSABLE) ×6
GOWN STRL REUS W/TWL XL LVL3 (GOWN DISPOSABLE) ×4
K-WIRE 2X5 SS THRDED S3 (WIRE) ×6
KIT BASIN OR (CUSTOM PROCEDURE TRAY) ×3 IMPLANT
KIT TURNOVER KIT B (KITS) ×3 IMPLANT
KWIRE 2X5 SS THRDED S3 (WIRE) ×2 IMPLANT
LOOP VESSEL MAXI BLUE (MISCELLANEOUS) IMPLANT
MANIFOLD NEPTUNE II (INSTRUMENTS) IMPLANT
NS IRRIG 1000ML POUR BTL (IV SOLUTION) ×3 IMPLANT
PACK TOTAL JOINT (CUSTOM PROCEDURE TRAY) ×3 IMPLANT
PACK UNIVERSAL I (CUSTOM PROCEDURE TRAY) ×3 IMPLANT
PAD ARMBOARD 7.5X6 YLW CONV (MISCELLANEOUS) ×9 IMPLANT
PEG LOCKING 3.2MMX46 (Peg) ×3 IMPLANT
PEG LOCKING 3.2X32 (Peg) ×3 IMPLANT
PEG LOCKING 3.2X34 (Screw) ×3 IMPLANT
PEG LOCKING 3.2X36 (Screw) ×3 IMPLANT
PEG LOCKING 3.2X40 (Peg) ×3 IMPLANT
PEG LOCKING 3.2X42 (Screw) ×3 IMPLANT
PEG LOCKING 3.2X52 (Peg) ×6 IMPLANT
PLATE PROX HUMERUS HI LT 4H (Plate) ×6 IMPLANT
SCREW LOCK CORT STAR 3.5X24 (Screw) ×6 IMPLANT
SCREW LP NL T15 3.5X24 (Screw) ×6 IMPLANT
SCREW LP NL T15 3.5X26 (Screw) ×3 IMPLANT
SPONGE LAP 18X18 RF (DISPOSABLE) IMPLANT
STAPLER SKIN PROX WIDE 3.9 (STAPLE) ×3 IMPLANT
STAPLER VISISTAT 35W (STAPLE) IMPLANT
STOCKINETTE IMPERVIOUS LG (DRAPES) ×3 IMPLANT
SUCTION FRAZIER HANDLE 10FR (MISCELLANEOUS) ×2
SUCTION TUBE FRAZIER 10FR DISP (MISCELLANEOUS) ×1 IMPLANT
SUT ETHIBOND 5 LR DA (SUTURE) ×3 IMPLANT
SUT FIBERWIRE #2 38 T-5 BLUE (SUTURE)
SUT PDS AB 2-0 CT1 27 (SUTURE) IMPLANT
SUT VIC AB 0 CT1 27 (SUTURE) ×3
SUT VIC AB 0 CT1 27XBRD ANBCTR (SUTURE) ×1 IMPLANT
SUT VIC AB 2-0 CT1 27 (SUTURE) ×3
SUT VIC AB 2-0 CT1 TAPERPNT 27 (SUTURE) ×1 IMPLANT
SUT VIC AB 2-0 CT3 27 (SUTURE) IMPLANT
SUTURE FIBERWR #2 38 T-5 BLUE (SUTURE) IMPLANT
SYR 5ML LL (SYRINGE) IMPLANT
TOWEL OR 17X24 6PK STRL BLUE (TOWEL DISPOSABLE) ×3 IMPLANT
TOWEL OR 17X26 10 PK STRL BLUE (TOWEL DISPOSABLE) ×6 IMPLANT
TRAY FOLEY MTR SLVR 16FR STAT (SET/KITS/TRAYS/PACK) IMPLANT
WATER STERILE IRR 1000ML POUR (IV SOLUTION) ×3 IMPLANT
YANKAUER SUCT BULB TIP NO VENT (SUCTIONS) ×3 IMPLANT

## 2018-07-23 NOTE — Progress Notes (Signed)
SLP Cancellation Note  Patient Details Name: Deanna Page MRN: [id number] DOB: 1929-10-18   Cancelled treatment:        Planned to see for dysphagia follow up. Currently having ORIF. Will continue efforts.    Royce Macadamia 07/23/2018, 11:09 AM  Breck Coons [place].Ed Nurse, children's [phone] Office [phone]

## 2018-07-23 NOTE — Plan of Care (Signed)
Progressing towards goals

## 2018-07-23 NOTE — Anesthesia Procedure Notes (Signed)
Procedure Name: Intubation Date/Time: 07/23/2018 12:20 PM Performed by: Rosiland Oz, CRNA Pre-anesthesia Checklist: Patient identified, Emergency Drugs available, Suction available, Patient being monitored and Timeout performed Patient Re-evaluated:Patient Re-evaluated prior to induction Oxygen Delivery Method: Circle system utilized Preoxygenation: Pre-oxygenation with 100% oxygen Induction Type: IV induction Ventilation: Mask ventilation without difficulty Laryngoscope Size: Glidescope and 3 Grade View: Grade I Tube type: Oral Tube size: 7.0 mm Number of attempts: 2 Airway Equipment and Method: Stylet Placement Confirmation: ETT inserted through vocal cords under direct vision,  positive ETCO2 and breath sounds checked- equal and bilateral Secured at: 21 cm Tube secured with: Tape Dental Injury: Teeth and Oropharynx as per pre-operative assessment  Difficulty Due To: Difficulty was anticipated, Difficult Airway- due to anterior larynx and Difficult Airway- due to limited oral opening Comments: DL attempt x1 miller 3, unable to displace jaw enough to get blade fully into mouth, Glidescope mac3 x1

## 2018-07-23 NOTE — Progress Notes (Signed)
   Subjective: No acute events overnight. Having persistent left arm pain. Denies pain anywhere else. Discussed plan for surgery to repair her fracture.   Objective:  Vital signs in last 24 hours: Vitals:   07/22/18 1500 07/22/18 1932 07/22/18 2342 07/23/18 0345  BP: 128/66 122/69 126/60 138/70  Pulse: 89 88 81 91  Resp: 17 18 18 18   Temp: 98.5 F (36.9 C) 98.3 F (36.8 C) 98.1 F (36.7 C) 98 F (36.7 C)  TempSrc: Axillary Axillary Axillary Axillary  SpO2: 97% 98% 98% 99%  Weight:      Height:       General: pleasantly demented elderly female lying in bed in NAD CV: RRR Abd: Soft, non-distended, non-tender   Assessment/Plan:  Principal Problem:   Acute ischemic left PCA stroke (HCC) Active Problems:   Essential hypertension   Parkinson's disease (HCC)   Closed fracture of proximal end of left humerus, initial encounter  Deanna Page is an 85 y/o female with h/o Parkinson's Disease, CKD III, hypothyroidism who presented from her nursing facility after an unwitnessed fall. Found to have displaced left humeral fracture which will need surgical intervention. Neurology was also consulted due to CT head findings of PCA stroke of indeterminate chronicity. MRI confirmed that this was a chronic finding.   Displaced left humeral fracture - greatly appreciate orthopedic consult; planning on ORIF - given that this is a fragility fracture will start bisphosphonate post-operatively  - PT/OT post-operatively  - keep NPO for surgery   Chronic PCA infarct - stroke work-up initiated on admission due to findings on initial head CT; MRI later confirmed that this was a chronic finding  - carotid dopplers without significant obstruction. Lower extremity dopplers negative for DVT. Echo with impaired diastolic function, preserved EF, no atrial enlargement or valvular abnormalities.  - appreciate SLP evaluation. MBS done yesterday which demonstrated mild oral dysphagia with aspiration.  Recommending dysphagia 2 diet which we will resume after surgery - continue aspirin and high intensity statin   AKI on CKD III - crt has improved on IVF - will continue maintenance fluids with D5 1/2 NS at 75 ml/hr while she remains NPO for surgery  Parkinson's disease Dementia  - continue home Sinemet and Namenda   Depression, anxiety - stable on Celexa, perphenazine, klonopin   Dispo: Anticipated discharge back to SNF pending after post-op recovery.   Lenward Chancellor D, DO 07/23/2018, 6:12 AM Pager: [phone]

## 2018-07-23 NOTE — Anesthesia Preprocedure Evaluation (Addendum)
Anesthesia Evaluation  Patient identified by MRN, date of birth, ID band Patient awake and Patient confused    Reviewed: Allergy & Precautions, NPO status , Patient's Chart, lab work & pertinent test results, reviewed documented beta blocker date and time , Unable to perform ROS - Chart review only  History of Anesthesia Complications Negative for: history of anesthetic complications  Airway      Mouth opening: Limited Mouth Opening Comment: Pt uncooperative Dental  (+) Missing, Implants, Dental Advisory Given, Poor Dentition   Pulmonary sleep apnea ,  SARS coronavirus NEG   breath sounds clear to auscultation       Cardiovascular hypertension, Pt. on home beta blockers and Pt. on medications  Rhythm:Regular Rate:Normal  07/22/2018 ECHO: EF 55-60%, mitral and aortic valves with annular calcifcation   Neuro/Psych Anxiety Depression Dementia Parkinson's TIA   GI/Hepatic hiatal hernia, GERD  ,  Endo/Other  Hypothyroidism   Renal/GU Renal InsufficiencyRenal disease (creat 1.41)     Musculoskeletal   Abdominal   Peds  Hematology Hb 9.8, plt 142   Anesthesia Other Findings   Reproductive/Obstetrics                            Anesthesia Physical Anesthesia Plan  ASA: III  Anesthesia Plan: General   Post-op Pain Management:    Induction: Intravenous  PONV Risk Score and Plan: 3 and Ondansetron, Dexamethasone and Treatment may vary due to age or medical condition  Airway Management Planned: Oral ETT  Additional Equipment:   Intra-op Plan:   Post-operative Plan: Extubation in OR  Informed Consent: I have reviewed the patients History and Physical, chart, labs and discussed the procedure including the risks, benefits and alternatives for the proposed anesthesia with the patient or authorized representative who has indicated his/her understanding and acceptance.     Dental advisory given  and Consent reviewed with POA  Plan Discussed with: CRNA and Surgeon  Anesthesia Plan Comments: (Discussed with pt's husband by telephone)        Anesthesia Quick Evaluation

## 2018-07-23 NOTE — Anesthesia Postprocedure Evaluation (Signed)
Anesthesia Post Note  Patient: Deanna Page  Procedure(s) Performed: OPEN REDUCTION INTERNAL FIXATION (ORIF) PROXIMAL HUMERUS FRACTURE (Left Arm Upper)     Patient location during evaluation: PACU Anesthesia Type: General Level of consciousness: awake and alert and confused (remains minimally communicative, as was pre-op) Pain management: pain level controlled Vital Signs Assessment: post-procedure vital signs reviewed and stable Respiratory status: spontaneous breathing, nonlabored ventilation, respiratory function stable and patient connected to nasal cannula oxygen Cardiovascular status: blood pressure returned to baseline and stable Postop Assessment: no apparent nausea or vomiting Anesthetic complications: no    Last Vitals:  Vitals:   07/23/18 1521 07/23/18 1525  BP: (!) 166/81 (!) 166/81  Pulse: 74 74  Resp: 14 17  Temp:  (!) 36.1 C  SpO2: 100% 98%    Last Pain:  Vitals:   07/23/18 1054  TempSrc: Oral  PainSc:                  Deanna Page,E. Maureen Duesing

## 2018-07-23 NOTE — TOC Initial Note (Signed)
Transition of Care [facility]) - Initial/Assessment Note    Patient Details  Name: Deanna Page MRN: [id number] Date of Birth: 09/20/1929  [facility]) CM/SW Contact:    Baldemar Lenis, LCSW Phone Number: 07/23/2018, 11:07 AM  Clinical Narrative:  CSW spoke with patient's husband, Molly Maduro, via phone to discuss discharge plans. CSW confirmed plan to return to [facility] at discharge. Molly Maduro indicated that the patient has been at [facility] since this last November, and while he is hopeful that maybe someday she can come back home, there are no plans working on that at this time. Molly Maduro says that the patient would need to be more independent before that could happen, as he has limited abilities to provide care for her due to his own limitations. Robert appreciative of the care that his wife is receiving, and that he is being contacted with updates.                  Expected Discharge Plan: Skilled Nursing Facility Barriers to Discharge: Continued Medical Work up, English as a second language teacher   Patient Goals and CMS Choice Patient states their goals for this hospitalization and ongoing recovery are:: patient unable to participate in goal setting due to confusion; patient's husband hopeful that the patient comes through surgery and heals well CMS Medicare.gov Compare Post Acute Care list provided to:: Patient Represenative (must comment) Choice offered to / list presented to : Spouse  Expected Discharge Plan and Services Expected Discharge Plan: Skilled Nursing Facility     Post Acute Care Choice: Skilled Nursing Facility Living arrangements for the past 2 months: Skilled Nursing Facility                                      Prior Living Arrangements/Services Living arrangements for the past 2 months: Skilled Nursing Facility Lives with:: Facility Resident Patient language and need for interpreter reviewed:: No Do you feel safe going back to the place where you  live?: Yes      Need for Family Participation in Patient Care: Yes (Comment) Care giver support system in place?: Yes (comment)   Criminal Activity/Legal Involvement Pertinent to Current Situation/Hospitalization: No - Comment as needed  Activities of Daily Living      Permission Sought/Granted Permission sought to share information with : Facility Medical sales representative, Family Supports Permission granted to share information with : Yes, Verbal Permission Granted  Share Information with NAME: Molly Maduro  Permission granted to share info w AGENCY: IT consultant granted to share info w Relationship: Spouse     Emotional Assessment Appearance:: Appears stated age Attitude/Demeanor/Rapport: Unable to Assess Affect (typically observed): Unable to Assess Orientation: : Oriented to Self Alcohol / Substance Use: Not Applicable Psych Involvement: No (comment)  Admission diagnosis:  Renal insufficiency [N28.9] Normochromic normocytic anemia [D64.9] Fall at nursing home, initial encounter [W19.Lorne Skeens, Y92.129] Cerebrovascular accident (CVA) due to occlusion of left posterior cerebral artery (HCC) [I63.532] Other closed displaced fracture of proximal end of left humerus, initial encounter [S42.292A] Patient Active Problem List   Diagnosis Date Noted  . Acute ischemic left PCA stroke (HCC) 07/21/2018  . Closed fracture of proximal end of left humerus, initial encounter 07/21/2018  . Hypothyroidism 07/21/2018  . CKD (chronic kidney disease), stage III (HCC) 07/21/2018  . Suicidal ideation   . Dementia (HCC) 12/30/2016  . Parkinson's disease (HCC) 03/01/2013  . Essential hypertension 06/02/2006  PCP:  Jethro BastosKoehler, Robert N, MD Pharmacy:  No Pharmacies Listed    Social Determinants of Health (SDOH) Interventions    Readmission Risk Interventions No flowsheet data found.

## 2018-07-23 NOTE — Progress Notes (Signed)
Physical Therapy Treatment Note  Patient seen for mobility progression. Pt requires +2 for safety with OOB mobility. Pt tolerated short distance gait in room this session. NWB L UE maintained throughout.  Continue to progress as tolerated with anticipated d/c to SNF for further skilled PT services.     07/23/18 1140  PT Visit Information  Last PT Received On 07/23/18  Assistance Needed +2  PT/OT/SLP Co-Evaluation/Treatment Yes  Reason for Co-Treatment For patient/therapist safety;To address functional/ADL transfers;Complexity of the patient's impairments (multi-system involvement)  PT goals addressed during session Mobility/safety with mobility;Balance  History of Present Illness Pt is an 85 y.o. female admitted from [facility]F on 07/21/18 after an unwitnessed fall with AMS and c/o L shoulder pain. Workup revealed L proximal humerus fx. 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 PMH includes Parkinson's disease, memory loss, CKD III, depression.  Precautions  Precautions Fall;Shoulder  Precaution Comments L UE sling required for comfort  Required Braces or Orthoses Sling  Restrictions  Weight Bearing Restrictions Yes  LUE Weight Bearing NWB  Pain Assessment  Pain Assessment Faces  Faces Pain Scale 2  Pain Location generalized with movement  Pain Descriptors / Indicators Grimacing;Guarding  Pain Intervention(s) Limited activity within patient's tolerance;Monitored during session;Repositioned  Cognition  Arousal/Alertness Awake/alert  Behavior During Therapy WFL for tasks assessed/performed;Flat affect  Overall Cognitive Status History of cognitive impairments - at baseline  Bed Mobility  Overal bed mobility Needs Assistance  Bed Mobility Supine to Sit  Supine to sit Min assist;+2 for safety/equipment;HOB elevated  General bed mobility comments cues for sequencing; assist to elevate trunk into sitting; +2 for safety/comfort with transitional movement  Transfers  Overall  transfer level Needs assistance  Equipment used 1 person hand held assist (assist at trunk using gait belt)  Transfers Sit to/from Stand  Sit to Stand Mod assist;+2 safety/equipment;Min assist;+2 physical assistance  General transfer comment pt stood X 3 trials during session and tends to sit without warning; cues for hand placement and anterior translation of trunk; assist to power up into standing and for balance; posterior bias upon standing  Ambulation/Gait  Ambulation/Gait assistance +2 physical assistance;Mod assist;Max assist  Gait Distance (Feet)  (15 ft total)  Assistive device 1 person hand held assist (assist at trunk with gait belt)  Gait Pattern/deviations Trunk flexed;Leaning posteriorly;Shuffle  General Gait Details assist for balance and hip extension; posterior bias; cues for forward gaze and sequencing   Balance  Overall balance assessment Needs assistance  Sitting-balance support Single extremity supported;Feet supported  Sitting balance-Leahy Scale Fair  Postural control Posterior lean  Standing balance support Single extremity supported;During functional activity  Standing balance-Leahy Scale Poor  PT - End of Session  Equipment Utilized During Treatment Other (comment);Gait belt (LUE sling)  Activity Tolerance Patient tolerated treatment well  Patient left in chair;with call bell/phone within reach;with chair alarm set  Nurse Communication Mobility status   PT - Assessment/Plan  PT Plan Current plan remains appropriate  PT Visit Diagnosis Other abnormalities of gait and mobility (R26.89)  PT Frequency (ACUTE ONLY) Min 2X/week  Follow Up Recommendations SNF;Supervision/Assistance - 24 hour (return to [facility])  PT Consulting civil engineer (measurements PT)  AM-PAC PT "6 Clicks" Mobility Outcome Measure (Version 2)  Help needed turning from your back to your side while in a flat bed without using bedrails? 3  Help needed moving from lying on your back to sitting  on the side of a flat bed without using bedrails? 3  Help needed  moving to and from a bed to a chair (including a wheelchair)? 3  Help needed standing up from a chair using your arms (e.g., wheelchair or bedside chair)? 2  Help needed to walk in hospital room? 2  Help needed climbing 3-5 steps with a railing?  1  6 Click Score 14  Consider Recommendation of Discharge To: CIR/SNF/LTACH  PT Goal Progression  Progress towards PT goals Progressing toward goals  PT Time Calculation  PT Start Time (ACUTE ONLY) 0840  PT Stop Time (ACUTE ONLY) 16100918  PT Time Calculation (min) (ACUTE ONLY) 38 min  PT General Charges  $$ ACUTE PT VISIT 1 Visit  PT Treatments  $Gait Training 8-22 mins   Erline LevineKellyn Butch Otterson, PTA Acute Rehabilitation Services Pager: [phone] Office: [phone]

## 2018-07-23 NOTE — NC FL2 (Signed)
[place] MEDICAID FL2 LEVEL OF CARE SCREENING TOOL     IDENTIFICATION  Patient Name: Deanna Page Birthdate: 1929-05-25 Sex: female Admission Date (Current Location): 07/21/2018  [facility] [place]   Facility and Address:  [facility] [place], [address], [place], [place] [address]      Provider Number: [id number]  Attending Physician Name and Address:  Tyson AliasVincent, Duncan Thomas, *  Relative Name and Phone Number:       Current Level of Care: Hospital Recommended Level of Care: Skilled Nursing Facility Prior Approval Number:    Date Approved/Denied:   PASRR Number: [id number] [id number]  Discharge Plan: SNF    Current Diagnoses: Patient Active Problem List   Diagnosis Date Noted  . Acute ischemic left PCA stroke (HCC) 07/21/2018  . Closed fracture of proximal end of left humerus, initial encounter 07/21/2018  . Hypothyroidism 07/21/2018  . CKD (chronic kidney disease), stage III (HCC) 07/21/2018  . Suicidal ideation   . Dementia (HCC) 12/30/2016  . Parkinson's disease (HCC) 03/01/2013  . Essential hypertension 06/02/2006    Orientation RESPIRATION BLADDER Height & Weight     Self  Normal Continent Weight: 117 lb (53.1 kg)(per facility ppwk) Height:  5\' 3"  (160 cm)(per facility ppwk)  BEHAVIORAL SYMPTOMS/MOOD NEUROLOGICAL BOWEL NUTRITION STATUS      Continent Diet(see DC Summary)  AMBULATORY STATUS COMMUNICATION OF NEEDS Skin   Extensive Assist Verbally Normal                       Personal Care Assistance Level of Assistance  Bathing, Feeding, Dressing Bathing Assistance: Maximum assistance Feeding assistance: Limited assistance Dressing Assistance: Maximum assistance     Functional Limitations Info  Sight, Hearing, Speech Sight Info: Adequate Hearing Info: Adequate Speech Info: Adequate    SPECIAL CARE FACTORS FREQUENCY  PT (By licensed PT), OT (By licensed OT)     PT Frequency: 5x/wk OT Frequency: 5x/wk             Contractures Contractures Info: Not present    Additional Factors Info  Code Status, Allergies, Psychotropic Code Status Info: Full Allergies Info: Erythromycin, Penicillins, Aricept Donepezil Hcl, Clindamycin/lincomycin, Doxycycline, Pneumococcal Vaccines Psychotropic Info: Sinemet 1 tab in the morning, 1.5 tabs 4x/day; Celexa 30mg  daily; Klonopin 0.5mg  3x/day         Current Medications (07/23/2018):  This is the current hospital active medication list Current Facility-Administered Medications  Medication Dose Route Frequency Provider Last Rate Last Dose  . [MAR Hold]  stroke: mapping our early stages of recovery book   Does not apply Once Opyd, Lavone Neriimothy S, MD      . Mitzi Hansen[MAR Hold] acetaminophen (TYLENOL) tablet 650 mg  650 mg Oral Q4H PRN Opyd, Lavone Neriimothy S, MD   650 mg at 07/22/18 1636   Or  . Mitzi Hansen[MAR Hold] acetaminophen (TYLENOL) solution 650 mg  650 mg Per Tube Q4H PRN Opyd, Lavone Neriimothy S, MD       Or  . Mitzi Hansen[MAR Hold] acetaminophen (TYLENOL) suppository 650 mg  650 mg Rectal Q4H PRN Opyd, Lavone Neriimothy S, MD      . Mitzi Hansen[MAR Hold] aspirin EC tablet 325 mg  325 mg Oral Daily Marvel PlanXu, Jindong, MD   325 mg at 07/21/18 1707   Or  . [MAR Hold] aspirin suppository 300 mg  300 mg Rectal Daily Marvel PlanXu, Jindong, MD   300 mg at 07/22/18 1042  . [MAR Hold] carbidopa-levodopa (SINEMET IR) 25-100 MG per tablet immediate release 1.5  tablet  1.5 tablet Oral QID Briscoe Deutscher, MD   1.5 tablet at 07/23/18 1015  . [MAR Hold] Carbidopa-Levodopa ER (SINEMET CR) 25-100 MG tablet controlled release 1 tablet  1 tablet Oral Q0600 Opyd, Lavone Neri, MD   1 tablet at 07/23/18 0601  . [MAR Hold] citalopram (CELEXA) tablet 30 mg  30 mg Oral Daily Opyd, Lavone Neri, MD   30 mg at 07/23/18 1014  . [MAR Hold] clonazePAM (KLONOPIN) tablet 0.5 mg  0.5 mg Oral TID Opyd, Lavone Neri, MD   0.5 mg at 07/23/18 1014  . dextrose 5 %-0.45 % sodium chloride infusion   Intravenous Continuous [place], Carley D, DO      . [MAR Hold] fluorometholone  (FML) 0.1 % ophthalmic suspension 1 drop  1 drop Left Eye Daily Opyd, Lavone Neri, MD   1 drop at 07/23/18 1020  . [MAR Hold] levothyroxine (SYNTHROID) tablet 50 mcg  50 mcg Oral Q0600 Briscoe Deutscher, MD   50 mcg at 07/23/18 0601  . [MAR Hold] memantine (NAMENDA) tablet 10 mg  10 mg Oral BID Opyd, Lavone Neri, MD   10 mg at 07/23/18 1021  . [MAR Hold] morphine 2 MG/ML injection 1-3 mg  1-3 mg Intravenous Q4H PRN Opyd, Lavone Neri, MD   2 mg at 07/23/18 0607  . [MAR Hold] mupirocin ointment (BACTROBAN) 2 % 1 application  1 application Nasal BID Myrene Galas, MD   1 application at 07/23/18 1021  . [MAR Hold] pantoprazole (PROTONIX) EC tablet 40 mg  40 mg Oral Daily Opyd, Lavone Neri, MD   40 mg at 07/23/18 1014  . [MAR Hold] perphenazine (TRILAFON) tablet 4 mg  4 mg Oral BID Opyd, Lavone Neri, MD   4 mg at 07/23/18 1021  . povidone-iodine 10 % swab 2 application  2 application Topical Once Freeman Caldron, PA-C      . [MAR Hold] pravastatin (PRAVACHOL) tablet 20 mg  20 mg Oral q1800 Marvel Plan, MD   20 mg at 07/22/18 1635  . [MAR Hold] senna-docusate (Senokot-S) tablet 2 tablet  2 tablet Oral QHS Opyd, Lavone Neri, MD   2 tablet at 07/22/18 2206  . [MAR Hold] trimethoprim (TRIMPEX) tablet 100 mg  100 mg Oral Daily Opyd, Lavone Neri, MD   100 mg at 07/23/18 1021  . vancomycin (VANCOCIN) IVPB 1000 mg/200 mL premix  1,000 mg Intravenous On Call to OR Freeman Caldron, PA-C         Discharge Medications: Please see discharge summary for a list of discharge medications.  Relevant Imaging Results:  Relevant Lab Results:   Additional Information SS#: [id number]  Baldemar Lenis, LCSW

## 2018-07-23 NOTE — Transfer of Care (Signed)
Immediate Anesthesia Transfer of Care Note  Patient: Deanna Page  Procedure(s) Performed: OPEN REDUCTION INTERNAL FIXATION (ORIF) PROXIMAL HUMERUS FRACTURE (Left Arm Upper)  Patient Location: PACU  Anesthesia Type:General  Level of Consciousness: drowsy  Airway & Oxygen Therapy: Patient Spontanous Breathing and Patient connected to face mask oxygen  Post-op Assessment: Report given to RN and Post -op Vital signs reviewed and stable  Post vital signs: Reviewed and stable  Last Vitals:  Vitals Value Taken Time  BP 145/74 07/23/2018  2:36 PM  Temp 36.1 C 07/23/2018  2:36 PM  Pulse 77 07/23/2018  2:39 PM  Resp 17 07/23/2018  2:39 PM  SpO2 100 % 07/23/2018  2:39 PM  Vitals shown include unvalidated device data.  Last Pain:  Vitals:   07/23/18 1054  TempSrc: Oral  PainSc:          Complications: No apparent anesthesia complications

## 2018-07-23 NOTE — Progress Notes (Signed)
Pt progressing towards acute OT goals. Adjusted sling. Completed in room functional mobility (to sink and then to recliner). Very unsteady +1 max HHA, +2 for safety and chair follow. Rest breaks incorporated. Pt is for surgery today for ORIF.  Raynald KempKathryn Gregory Dowe, OT Acute Rehabilitation Services Pager: [phone] Office: [phone]    07/23/18 1200  OT Visit Information  Last OT Received On 07/23/18  Assistance Needed +2  PT/OT/SLP Co-Evaluation/Treatment Yes  Reason for Co-Treatment For patient/therapist safety;To address functional/ADL transfers;Complexity of the patient's impairments (multi-system involvement)  OT goals addressed during session ADL's and self-care;Proper use of Adaptive equipment and DME;Strengthening/ROM  History of Present Illness Pt is an 85 y.o. female [facility]18 after an unwitnessed fall with AMS and c/o L shoulder pain. Workup revealed L proximal humerus fx. 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 PMH includes Parkinson's disease, memory loss, CKD III, depression.  Precautions  Precautions Fall;Shoulder  Precaution Comments L UE sling required for comfort  Required Braces or Orthoses Sling  Pain Assessment  Pain Assessment Faces  Faces Pain Scale 2  Pain Location generalized with movement  Pain Descriptors / Indicators Grimacing;Guarding  Pain Intervention(s) Limited activity within patient's tolerance;Monitored during session;Repositioned  Cognition  Arousal/Alertness Awake/alert  Behavior During Therapy Flat affect  Overall Cognitive Status History of cognitive impairments - at baseline  Upper Extremity Assessment  Upper Extremity Assessment LUE deficits/detail  LUE Deficits / Details L humerus fx, ORIF planned for today  Lower Extremity Assessment  Lower Extremity Assessment Defer to PT evaluation  ADL  Overall ADL's  Needs assistance/impaired  Grooming Moderate assistance;Oral care;Sitting  Functional  mobility during ADLs +2 for physical assistance;Moderate assistance  General ADL Comments Pt walked to/from sink, completed grooming task at sink, rest breaks incorporated. Pt very unsteady. +2 for mobility  Bed Mobility  Overal bed mobility Needs Assistance  Bed Mobility Supine to Sit  Supine to sit Min assist;+2 for safety/equipment;HOB elevated  General bed mobility comments cues for sequencing; assist to elevate trunk into sitting; +2 for safety/comfort with transitional movement  Balance  Overall balance assessment Needs assistance  Sitting-balance support Single extremity supported;Feet supported  Sitting balance-Leahy Scale Fair  Postural control Posterior lean  Standing balance support Single extremity supported;During functional activity  Standing balance-Leahy Scale Poor  Restrictions  Weight Bearing Restrictions Yes  LUE Weight Bearing NWB  Transfers  Overall transfer level Needs assistance  Equipment used 1 person hand held assist (assist at trunk using gait belt)  Transfers Sit to/from Stand  Sit to Stand Mod assist;+2 safety/equipment;Min assist;+2 physical assistance  General transfer comment pt stood X 3 trials during session and tends to sit without warning; cues for hand placement and anterior translation of trunk; assist to power up into standing and for balance; posterior bias upon standing  General Comments  General comments (skin integrity, edema, etc.) Pt reporting some dizziness once she arrived at sink. Seated rest break while vitals assessed. VSS. Pt reports dizziness alleviated while sitting.  OT - End of Session  Equipment Utilized During Treatment Other (comment);Gait belt (sling)  Activity Tolerance Patient tolerated treatment well  Patient left in chair;with call bell/phone within reach;with chair alarm set  Nurse Communication Mobility status  OT Assessment/Plan  OT Plan Discharge plan remains appropriate  OT Visit Diagnosis Unsteadiness on feet  (R26.81);Muscle weakness (generalized) (M62.81)  OT Frequency (ACUTE ONLY) Min 2X/week  Follow Up Recommendations SNF ([facility] return)  OT Equipment 3 in 1 bedside commode;Wheelchair (  measurements OT);Wheelchair cushion (measurements OT)  AM-PAC OT "6 Clicks" Daily Activity Outcome Measure (Version 2)  Help from another person eating meals? 2  Help from another person taking care of personal grooming? 2  Help from another person toileting, which includes using toliet, bedpan, or urinal? 2  Help from another person bathing (including washing, rinsing, drying)? 2  Help from another person to put on and taking off regular upper body clothing? 2  Help from another person to put on and taking off regular lower body clothing? 1  6 Click Score 11  OT Goal Progression  Progress towards OT goals Progressing toward goals  Acute Rehab OT Goals  Patient Stated Goal not stated this session  OT Goal Formulation Patient unable to participate in goal setting  Time For Goal Achievement 08/04/18  Potential to Achieve Goals Good  ADL Goals  Pt Will Perform Grooming with set-up;sitting  Pt Will Perform Upper Body Bathing with set-up;sitting  Pt Will Transfer to Toilet with min assist;bedside commode;ambulating  Additional ADL Goal #1 pt will complete bed mobility supervision level as precursor to adls.  OT Time Calculation  OT Start Time (ACUTE ONLY) 0840  OT Stop Time (ACUTE ONLY) 9833  OT Time Calculation (min) 38 min  OT General Charges  $OT Visit 1 Visit  OT Treatments  $Self Care/Home Management  23-37 mins

## 2018-07-23 NOTE — Op Note (Signed)
07/23/2018  2:56 PM  PATIENT:  Deanna Page  85 y.o. female  PRE-OPERATIVE DIAGNOSIS:  left proximal humerus fracture with severe displacement  POST-OPERATIVE DIAGNOSIS:  left proximal humerus fracture  PROCEDURE:  Procedure(s): OPEN REDUCTION INTERNAL FIXATION (ORIF) PROXIMAL HUMERUS FRACTURE (Left)  SURGEON:  Surgeon(s) and Role:    Myrene Galas, MD - Primary  PHYSICIAN ASSISTANT: Montez Morita, PA-C  ANESTHESIA:   general  EBL:  50 mL   BLOOD ADMINISTERED:none  DRAINS: none   LOCAL MEDICATIONS USED:  NONE  SPECIMEN:  No Specimen  DISPOSITION OF SPECIMEN:  N/A  COUNTS:  YES  TOURNIQUET:  * No tourniquets in log *  DICTATION: .Note written in EPIC  PLAN OF CARE: Admit to inpatient   PATIENT DISPOSITION:  PACU - hemodynamically stable.   Delay start of Pharmacological VTE agent (>24hrs) due to surgical blood loss or risk of bleeding: no  BRIEF SUMMARY OF INDICATION FOR PROCEDURE:  Deanna Page is a very pleasant 85 y.o. right-hand dominant female with demintia who fell at her facilityand sustained a left proximal humerus fracture. In order to most reliably restore function and reduce pain I recommended surgical repair given the severe displacement.  I discussed risks of heart attack, stroke, infection, malunion, nonunion, loss of motion, DVT/ PE, loss of reduction, avascular necrosis, screw penetration, and need for further surgery, among others with her husband. Consent was provided to proceed.  BRIEF SUMMARY OF PROCEDURE:  After preoperative antibiotics, the patient was taken to the operating room where general anesthesia was induced. The standard deltopectoral approach was made after time-out.  Dissection was carried down to the interval where the superior lateral edge of the pec insertion was mobilized as well as the more medial anterior edge of the deltoid.  I was able to mobilize the head segment and shaft segments. Hematoma was evacuated with curettes  and lavage. Reduction maneuvers were performed while keeping the periostial attachments to all the fragments.  Pins were used provisionally followed by the plate with additional pins for further fixation.  C-arm confirmed appropriate alignment and reduction with goals for restoration of proper head shaft orientation and tuberosity position. Locked pegs were placed into the head and two standard and two locked screws into the shaft.  Final images showed appropriate reduction, hardware trajectory, and length. Outstanding clinical motion was feasible on the table, including abduction, and internal and external rotation. My assistant, Montez Morita, was present and assisting to facilitate exposure, fixation, and closure.  An assistant was necessary for safe and effective completion as the reduction had to be held during provisional and definitive fixation. The wound was thoroughly irrigated and then closed in a standard layered fashion using #1 Vicryl to reapproximate the superior edge of the pec and anterior edge of the delt and then 0 for reapproximation of the muscular interval, 2-0 Vicryl and nylon for the skin.  Sterile gently compressive dressing was applied and a sling with an Ace from hand to upper arm.  The patient was taken to PACU in stable condition.  PROGNOSIS:  Deanna Page will have unrestricted gentle passive range of motion of the operative shoulder at this time with active elbow, wrist, and digital motion.  She may may be a candidate for earlier weightbearing with a platform walker to restrict her to axial loads, but if can mobilize effectively without that, then we will defer.  I concerned about the fall risk.  Bone quality was expected with regard to her age.  Future fall risk remains a concern.     Deanna Page, M.D.

## 2018-07-24 ENCOUNTER — Encounter (HOSPITAL_COMMUNITY): Payer: Self-pay | Admitting: Orthopedic Surgery

## 2018-07-24 LAB — CBC
HCT: 24.5 % — ABNORMAL LOW (ref 36.0–46.0)
Hemoglobin: 7.9 g/dL — ABNORMAL LOW (ref 12.0–15.0)
MCH: 30.6 pg (ref 26.0–34.0)
MCHC: 32.2 g/dL (ref 30.0–36.0)
MCV: 95 fL (ref 80.0–100.0)
Platelets: 133 10*3/uL — ABNORMAL LOW (ref 150–400)
RBC: 2.58 MIL/uL — ABNORMAL LOW (ref 3.87–5.11)
RDW: 13.7 % (ref 11.5–15.5)
WBC: 8.8 10*3/uL (ref 4.0–10.5)
nRBC: 0 % (ref 0.0–0.2)

## 2018-07-24 LAB — BASIC METABOLIC PANEL
Anion gap: 8 (ref 5–15)
BUN: 22 mg/dL (ref 8–23)
CO2: 24 mmol/L (ref 22–32)
Calcium: 8.6 mg/dL — ABNORMAL LOW (ref 8.9–10.3)
Chloride: 107 mmol/L (ref 98–111)
Creatinine, Ser: 1.02 mg/dL — ABNORMAL HIGH (ref 0.44–1.00)
GFR calc Af Amer: 58 mL/min — ABNORMAL LOW (ref >60)
GFR calc non Af Amer: 50 mL/min — ABNORMAL LOW (ref >60)
Glucose, Bld: 118 mg/dL — ABNORMAL HIGH (ref 70–99)
Potassium: 4 mmol/L (ref 3.5–5.1)
Sodium: 139 mmol/L (ref 135–145)

## 2018-07-24 LAB — HEMOGLOBIN AND HEMATOCRIT, BLOOD
HCT: 27.8 % — ABNORMAL LOW (ref 36.0–46.0)
Hemoglobin: 9.1 g/dL — ABNORMAL LOW (ref 12.0–15.0)

## 2018-07-24 MED ORDER — VITAMIN C 500 MG PO TABS
500.0000 mg | ORAL_TABLET | Freq: Every day | ORAL | Status: DC
  Administered 2018-07-24 – 2018-07-25 (×2): 500 mg via ORAL
  Filled 2018-07-24 (×2): qty 1

## 2018-07-24 MED ORDER — CITALOPRAM HYDROBROMIDE 10 MG PO TABS
20.0000 mg | ORAL_TABLET | Freq: Every day | ORAL | Status: DC
  Administered 2018-07-24 – 2018-07-25 (×2): 20 mg via ORAL
  Filled 2018-07-24: qty 2

## 2018-07-24 MED ORDER — ALENDRONATE SODIUM 10 MG PO TABS: 70.0000 mg | ORAL_TABLET | ORAL | Status: DC

## 2018-07-24 MED ORDER — VITAMIN D3 25 MCG (1000 UNIT) PO TABS
2000.0000 [IU] | ORAL_TABLET | Freq: Two times a day (BID) | ORAL | Status: DC
  Administered 2018-07-24 – 2018-07-25 (×3): 2000 [IU] via ORAL
  Filled 2018-07-24 (×6): qty 2

## 2018-07-24 NOTE — Progress Notes (Signed)
Pt becoming agitated and confused, having difficulty urinating. Paged MD x3, awaiting response.

## 2018-07-24 NOTE — Progress Notes (Signed)
  Speech Language Pathology Treatment: Dysphagia  Patient Details Name: Deanna Page MRN: [id number] DOB: [birth year] Today's Date: 07/24/2018 Time: 1505-6979 SLP Time Calculation (min) (ACUTE ONLY): 8 min  Assessment / Plan / Recommendation Clinical Impression  Pt bright eyed, immediate response to questions; making needs known. Less impulsive and taking small sips from cup with assist to hold. No pocketing or difficulty with small bite of cracker however recommend continue Dys 2 for now- suspect close but may not quite be back to baseline. Would recommend ST eval at next venue of care to upgrade texture if she is discharged over the weekend. Continue thin, meds crushed and full assist.    HPI HPI: Pt is an 85 y.o. female with medical history significant for Parkinson's disease, memory loss, chronic kidney disease stage III, depression, hypothyroidism, and recurrent UTI, now presentng to the emergency department after an unwitnessed fall. CT of the head showed acute to subacute appearing left PCA territory infarct. MBS 11/2017 with trace penetration and mod vallecular residue. BSE 02/12/18 with immediate cough suspected due to residue mixing with thin. Dys 3, thin recommended.       SLP Plan  Continue with current plan of care       Recommendations  Diet recommendations: Dysphagia 2 (fine chop);Thin liquid Liquids provided via: Cup Medication Administration: Crushed with puree Supervision: Patient able to self feed;Full supervision/cueing for compensatory strategies;Staff to assist with self feeding Compensations: Slow rate;Small sips/bites;Lingual sweep for clearance of pocketing Postural Changes and/or Swallow Maneuvers: Seated upright 90 degrees                Oral Care Recommendations: Oral care BID Follow up Recommendations: 24 hour supervision/assistance SLP Visit Diagnosis: Dysphagia, oral phase (R13.11) Plan: Continue with current plan of care       GO                 Royce Macadamia 07/24/2018, 11:30 AM   Breck Coons Lonell Face.Ed Nurse, children's [phone] Office [phone]

## 2018-07-24 NOTE — TOC Progression Note (Signed)
Transition of Care [facility]) - Progression Note    Patient Details  Name: Deanna Page MRN: [id number] Date of Birth: 29-Dec-1929  [facility] CM/SW Contact  Baldemar Lenis, [place] Phone Number: 07/24/2018, 4:54 PM  Clinical Narrative:   CSW contacted by MD earlier today about admitting patient to SNF today. CSW coordinated patient's ability to return, and then was alerted by MD that she is not stable for DC today. Patient can return tomorrow, and SNF would prefer admission in the morning. CSW to follow.    Expected Discharge Plan: Skilled Nursing Facility Barriers to Discharge: Continued Medical Work up, English as a second language teacher  Expected Discharge Plan and Services Expected Discharge Plan: Skilled Nursing Facility     Post Acute Care Choice: Skilled Nursing Facility Living arrangements for the past 2 months: Skilled Nursing Facility                                       Social Determinants of Health (SDOH) Interventions    Readmission Risk Interventions Readmission Risk Prevention Plan 07/24/2018  Transportation Screening Complete  PCP or Specialist Appt within 3-5 Days Complete  HRI or Home Care Consult Not Complete  HRI or Home Care Consult comments Admission to SNF  Social Work Consult for Recovery Care Planning/Counseling Complete  Palliative Care Screening Not Applicable  Some recent data might be hidden

## 2018-07-24 NOTE — Progress Notes (Signed)
Orthopedic Trauma Service Progress Note  Patient ID: Deanna Page MRN: [id number] DOB/AGE: 03/19/1929 85 y.o.  Subjective:  Pt doing ok  She is sitting up in bed Nurse feeding breakfast  All she can say is that her L shoulder is sore  Looks like fosamax was going to be started today. Please see in plan section regarding orthopaedic trauma service opinion on bisphosphonates and acute fracture   Review of Systems  Unable to perform ROS: Dementia    Objective:   VITALS:   Vitals:   07/23/18 2035 07/23/18 2323 07/24/18 0400 07/24/18 0803  BP: 130/74 (!) 99/59 111/69 (!) 98/59  Pulse: 90 79 76 62  Resp: 15 18 16 16   Temp: 98 F (36.7 C) 98.4 F (36.9 C) 98.7 F (37.1 C) (!) 96.2 F (35.7 C)  TempSrc: Oral Oral Oral Axillary  SpO2: 94% 94% 94% 96%  Weight:      Height:        Estimated body mass index is 20.73 kg/m as calculated from the following:   Height as of this encounter: 5\' 3"  (1.6 m).   Weight as of this encounter: 53.1 kg.   Intake/Output      05/21 0701 - 05/22 0700 05/22 0701 - 05/23 0700   P.O. 0    I.V. (mL/kg) 700 (13.2)    Other 50    IV Piggyback 200    Total Intake(mL/kg) 950 (17.9)    Urine (mL/kg/hr) 0 (0)    Blood 50    Total Output 50    Net +900           LABS  Results for orders placed or performed during the hospital encounter of 07/21/18 (from the past 24 hour(s))  CBC     Status: Abnormal   Collection Time: 07/24/18  4:47 AM  Result Value Ref Range   WBC 8.8 4.0 - 10.5 K/uL   RBC 2.58 (L) 3.87 - 5.11 MIL/uL   Hemoglobin 7.9 (L) 12.0 - 15.0 g/dL   HCT 40.924.5 (L) 81.136.0 - 91.446.0 %   MCV 95.0 80.0 - 100.0 fL   MCH 30.6 26.0 - 34.0 pg   MCHC 32.2 30.0 - 36.0 g/dL   RDW 78.213.7 95.611.5 - 21.315.5 %   Platelets 133 (L) 150 - 400 K/uL   nRBC 0.0 0.0 - 0.2 %  Basic metabolic panel     Status: Abnormal   Collection Time: 07/24/18  4:47 AM  Result Value Ref Range   Sodium 139 135 - 145 mmol/L   Potassium 4.0 3.5 - 5.1 mmol/L   Chloride 107 98 - 111 mmol/L   CO2 24 22 - 32 mmol/L   Glucose, Bld 118 (H) 70 - 99 mg/dL   BUN 22 8 - 23 mg/dL   Creatinine, Ser 0.861.02 (H) 0.44 - 1.00 mg/dL   Calcium 8.6 (L) 8.9 - 10.3 mg/dL   GFR calc non Af Amer 50 (L) >60 mL/min   GFR calc Af Amer 58 (L) >60 mL/min   Anion gap 8 5 - 15     PHYSICAL EXAM:   Gen: sitting up in bed, NAD, appears comfortable  Lungs: breathing unlabored Cardiac: pulse reg Ext:      Left upper extremity   Sling in place   Ecchymosis stable  Swelling stable  Dressing c/d/i to left shoulder  Ext is warm   Motor and sensory functions appear to be grossly intact   Assessment/Plan: 1 Day Post-Op   Principal Problem:   Closed fracture of proximal end of left humerus, initial encounter Active Problems:   Essential hypertension   Parkinson's disease (HCC)   Chronic ischemic left PCA stroke   Anti-infectives (From admission, onward)   Start     Dose/Rate Route Frequency Ordered Stop   07/23/18 2200  vancomycin (VANCOCIN) IVPB 1000 mg/200 mL premix     1,000 mg 200 mL/hr over 60 Minutes Intravenous Every 12 hours 07/23/18 2015 07/23/18 2245   07/23/18 1100  vancomycin (VANCOCIN) IVPB 1000 mg/200 mL premix  Status:  Discontinued     1,000 mg 200 mL/hr over 60 Minutes Intravenous On call to O.R. 07/22/18 2121 07/22/18 2123   07/23/18 1000  vancomycin (VANCOCIN) IVPB 1000 mg/200 mL premix     1,000 mg 200 mL/hr over 60 Minutes Intravenous On call to O.R. 07/22/18 1522 07/23/18 1900   07/23/18 0600  vancomycin (VANCOCIN) IVPB 1000 mg/200 mL premix  Status:  Discontinued     1,000 mg 200 mL/hr over 60 Minutes Intravenous To Surgery 07/22/18 1225 07/22/18 1527   07/21/18 1000  trimethoprim (TRIMPEX) tablet 100 mg     100 mg Oral Daily 07/21/18 0509      .  POD/HD#: 1   85 y/o female s/p fall with L proximal humerus fracture, multiple chronic medical problems  -Fall  - L  proximal humerus fracture, fragility fracture s/p ORIF   NWB L upper extremity   Sling when not working with therapy   Shoulder pendulums and gentle passive shoulder motion   Unrestricted ROM L elbow, forearm, wrist and hand   Ice prn  Dressing changes as needed   PT/OT  - Pain management:  Tylenol   Minimize narcotics    - ABL anemia/Hemodynamics  Cbc <8.0 and she is only POD 1   Think her h/h will continue to drift down  She is also a little hypotensive though not tachy   History of TIA and CKD (renal function currently stable)  Recheck h/h this afternoon, would recommend transfusion if continuing to drop    Cbc in am as well  - Medical issues   Per medical service   - DVT/PE prophylaxis:  Does not require pharmacologics from ortho standpoint  - ID:   periop abx completed   - Metabolic Bone Disease:  Metabolic bone work up    Labs ordered   pts fracture is consistent with fragility fracture and do think she could benefit from pharmacologic treatment of her poor bone quality.  However, we (orthopaedic trauma service) would not recommend the initiation of bisphosphonates during the acute healing phase of a fracture.  The literature is too conflicting to support the use of bisphosphonates in the acute healing phase and event then the literature tends to lean towards Zoledronic Acid.  We would recommend metabolic bone work up including labs and outpatient dexa scan before starting on any  Medication   Is suspect her bone quality is to the point where she really needs an anabolic agent rather than an anti-resorptive agent   We freely acknowledge that we may be biased against bisphosphonates as we regularly treat the atypical fractures associated with them and they are extremely difficult to treat. I do think that bisphosphonates have a role in the treatment of osteoporosis but not in the acute fracture population  We have the opportunity to work up the patient and determine the  most appropriate treatment protocol to address her bone disease.     - Activity:  PT/OT  NWB L upper extremity    - Impediments to fracture healing:  Poor bone quality   Dementia (ability to comply with restrictions) - Dispo:  PT/OT  Will likely need snf  Osteoporosis workup    Mearl Latin, PA-C [phone] (C) 07/24/2018, 10:46 AM  [facility] [address] [place] [address] [phone] Collier Bullock (F)

## 2018-07-24 NOTE — Care Management Important Message (Signed)
Important Message  Patient Details  Name: Deanna Page MRN: [id number] Date of Birth: Aug 17, 1929   Medicare Important Message Given:  Yes    [name] 07/24/2018, 2:04 PM

## 2018-07-24 NOTE — Discharge Summary (Addendum)
Name: Deanna Page MRN: [id number] DOB: [birth year] 85 y.o. PCP: Jethro BastosKoehler, Robert N, MD  Date of Admission: 07/21/2018  1:59 AM Date of Discharge:  07/25/2018 Attending Physician: Tyson AliasVincent, Duncan Thomas, *  Discharge Diagnosis: 1.  Displaced left humerus fracture 2.  Chronic PCA infarct 3.  Acute on chronic kidney disease stage III 4.  Parkinson's disease: Dementia 5.  Anemia secondary to iron deficiency vs anemia of chronic disease vs blood loss anemia  Discharge Medications: Allergies as of 07/25/2018      Reactions   Erythromycin    Other reaction(s): Other (See Comments) Other Reaction: GI Upset Unknown reaction   Penicillins Rash, Shortness Of Breath   Unknown reaction Has patient had a PCN reaction causing immediate rash, facial/tongue/throat swelling, SOB or lightheadedness with hypotension: Unknown Has patient had a PCN reaction causing severe rash involving mucus membranes or skin necrosis: Unknown Has patient had a PCN reaction that required hospitalization: Unknown Has patient had a PCN reaction occurring within the last 10 years: Unknown If all of the above answers are "NO", then may proceed with Cephalosporin use.   Aricept [donepezil Hcl]    Unknown reaction   Clindamycin/lincomycin Itching   Doxycycline    Unknown reaction   Pneumococcal Vaccines    Unknown reaction      Medication List    TAKE these medications   acetaminophen 325 MG tablet Commonly known as:  TYLENOL Take 650 mg by mouth 2 (two) times daily as needed for moderate pain.   alum & mag hydroxide-simeth 200-200-20 MG/5ML suspension Commonly known as:  MAALOX/MYLANTA Take 15 mLs by mouth 2 (two) times daily as needed for indigestion or heartburn.   antiseptic oral rinse Liqd 15 mLs by Mouth Rinse route 2 (two) times daily as needed for dry mouth.   aspirin 325 MG EC tablet Take 1 tablet (325 mg total) by mouth daily. Start taking on:  Jul 26, 2018   atenolol 25 MG tablet Commonly  known as:  TENORMIN Take 25 mg by mouth 2 (two) times daily.   Carbidopa-Levodopa ER 25-100 MG tablet controlled release Commonly known as:  SINEMET CR Take 1 tablet by mouth daily.   carbidopa-levodopa 25-100 MG tablet Commonly known as:  SINEMET IR Take 1.5 tablets by mouth 4 (four) times daily.   citalopram 20 MG tablet Commonly known as:  CELEXA Take 1 tablet (20 mg total) by mouth at bedtime. What changed:    how much to take  when to take this   clonazePAM 0.5 MG tablet Commonly known as:  KLONOPIN Take 1 tablet (0.5 mg total) by mouth 3 (three) times daily.   Ensure Original Liqd Take 8 oz by mouth 3 (three) times daily.   fluorometholone 0.1 % ophthalmic suspension Commonly known as:  FML Place 1 drop into the left eye daily.   levothyroxine 50 MCG tablet Commonly known as:  SYNTHROID Take 50 mcg by mouth daily before breakfast.   memantine 10 MG tablet Commonly known as:  NAMENDA Take 10 mg by mouth 2 (two) times daily.   pantoprazole 20 MG tablet Commonly known as:  PROTONIX Take 2 tablets (40 mg total) by mouth daily.   perphenazine 4 MG tablet Commonly known as:  TRILAFON Take 4 mg by mouth 2 (two) times daily.   polyvinyl alcohol 1.4 % ophthalmic solution Commonly known as:  LIQUIFILM TEARS Place 1 drop into both eyes 4 (four) times daily as needed for dry eyes.   pravastatin 20 MG  tablet Commonly known as:  PRAVACHOL Take 1 tablet (20 mg total) by mouth daily at 6 PM.   PreviDent 5000 Plus 1.1 % Crea dental cream Generic drug:  sodium fluoride Place 1 application onto teeth every evening.   sennosides-docusate sodium 8.6-50 MG tablet Commonly known as:  SENOKOT-S Take 2 tablets by mouth at bedtime.   trimethoprim 100 MG tablet Commonly known as:  TRIMPEX Take 100 mg by mouth daily.   Vitamin D (Cholecalciferol) 25 MCG (1000 UT) Caps Take 1 Units by mouth daily.       Disposition and follow-up:   Deanna Page was  discharged from [facility] in [place] condition.  At the hospital follow up visit please address:  1. #Displaced left humeral fracture -Continue physical therapy -Nonweightbearing left upper extremity -Keep sling in place when not working with physical therapy -Ice prn -Dressing prn -Follow-up with Dr. Carola Frost 1-2 weeks     #Post-op urinary retention -Post in and out catheter bladder scan 13cc. -Obtain frequent bladder scans at SNF  2.  Labs / imaging needed at time of follow-up: BMP  3.  Pending labs/ test needing follow-up:  Follow-up ionized calcium, PTH, and vitamin D 25 levels.  Follow-up Appointments:  Contact information for follow-up providers    Huston Foley, MD. Schedule an appointment as soon as possible for a visit in 4 week(s).   Specialties:  Neurology, Radiology Contact information: [address] [place] [address] [phone]            Contact information for after-discharge care    Destination    [facility] Preferred SNF .   Service:  Skilled Nursing Contact information: [address] [place] [address] [phone]                  Hospital Course by problem list:  Deanna Page is an 85 y/o female with h/o Parkinson's Disease, CKD III, hypothyroidism who presented from her nursing facility after an unwitnessed fall. Found to have displaced left humeral fracture and underwent ORIF on 07/23/18. Neurology was also consulted due to CT head findings of PCA stroke of indeterminate chronicity. MRI confirmed that this was a chronic finding.   1. Displaced left humeral fracture s/p ORIF on 5/21 - Pain was well controlled. She was monitored an additional day for observation. She did not require blood transfusions, and hgb was stable at 8.4 on day of discharge. Her iron studies was consistent with Iron deficiency anemia and received 1 time dose of IV iron.   2. Chronic PCA infarct -  stroke work-up initiated on admission due to findings on initial head CT; MRI later confirmed that this was a chronic finding  - carotid dopplers without significant obstruction. Lower extremity dopplers negative for DVT. Echo with impaired diastolic function, preserved EF, no atrial enlargement or valvular abnormalities - SLP eval recommended dysphagia 3 diet - she was continued on aspirin and high intensity statin  3.  Acute on chronic kidney disease stage III - resolved after IVF - crt stable at 1.1 on the day of discharge   Discharge Vitals:   BP (!) 105/54 (BP Location: Right Arm)   Pulse 66   Temp 98.1 F (36.7 C) (Axillary)   Resp 17   Ht  (1.6 m)   Wt 53.1 kg   SpO2 97%   BMI 20.73 kg/m   Pertinent Labs, Studies, and Procedures:  LEFT SHOULDER - 2+ VIEW  COMPARISON:  06/20/2016 left shoulder MRI  FINDINGS: Acute fracture of the left proximal humeral diaphysis with 1 shaft's width medial displacement of the shaft, 3 cm overriding. No shoulder joint dislocation.  IMPRESSION: Acute fracture of the left proximal humeral diaphysis with 1 shaft's width medial displacement of the shaft, 3 cm overriding. No Dislocation.   CLINICAL DATA:  Initial evaluation for acute altered mental status, unwitnessed fall, left upper extremity weakness.  EXAM: MRI HEAD WITHOUT CONTRAST  MRA HEAD WITHOUT CONTRAST  TECHNIQUE: Multiplanar, multiecho pulse sequences of the brain and surrounding structures were obtained without intravenous contrast. Angiographic images of the head were obtained using MRA technique without contrast.  COMPARISON:  Prior CT from earlier the same day.  FINDINGS: MRI HEAD FINDINGS  Brain: Examination severely degraded by motion artifact.  Generalized age-related cerebral atrophy with advanced chronic microvascular ischemic disease. Volume loss within the substantia nigra compatible with history of Parkinson's disease, grossly  similar. Chronic microvascular ischemic changes noted within the pons as well. Encephalomalacia within the parasagittal left occipital lobe compatible with left PCA territory infarct. This is largely chronic in appearance by MRI with resolved diffusion abnormality.  No abnormal foci of restricted diffusion to suggest acute or subacute ischemia. Gray-white matter differentiation otherwise maintained. No other areas of chronic cortical infarction. No acute or chronic intracranial hemorrhage.  No visible mass lesion, mass effect, or midline shift on this motion degraded exam. Diffuse ventricular prominence related to global parenchymal volume loss without hydrocephalus. No extra-axial fluid collection.  Vascular: Major intracranial vascular flow voids grossly maintained at the skull base. Diminutive vertebrobasilar system.  Skull and upper cervical spine: Craniocervical junction grossly within normal limits. No obvious focal marrow replacing lesion. No appreciable scalp soft tissue abnormality.  Sinuses/Orbits: Globes and orbital soft tissues demonstrate no acute finding. Paranasal sinuses are clear. Small bilateral mastoid effusions, of doubtful significance. Inner ear structures grossly normal.  Other: None.  MRA HEAD FINDINGS  ANTERIOR CIRCULATION:  Examination severely degraded by motion artifact.  Visualized distal cervical segments of the internal carotid arteries are patent with antegrade flow. Normal flow related signal seen throughout the petrous, cavernous, and supraclinoid ICAs. No obvious flow-limiting stenosis. 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 No definite associated aneurysm on this motion degraded exam. A1 segments patent. Anterior cerebral arteries grossly patent to their distal aspects.  M1 segments grossly patent bilaterally. Distal MCA branches well perfused and symmetric.  POSTERIOR CIRCULATION:  Markedly diminutive  vertebrobasilar system, not well assessed on this motion degraded exam. Left vertebral artery slightly dominant and may terminate in PICA. Proximal and mid basilar artery markedly diminutive and not well seen. 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 Superior cerebral arteries not well assessed on this examination. Both of the posterior cerebral arteries grossly patent to their distal aspects.  No obvious intracranial aneurysm.  IMPRESSION: MRI HEAD IMPRESSION:  1. Severely motion degraded exam. 2. No acute intracranial abnormality identified. 3. Left PCA territory infarct, largely chronic in appearance by MRI. 4. Underlying advanced cerebral atrophy with chronic small vessel ischemic disease. 5. Volume loss within the substantia nigra, compatible with history of Parkinson's disease.  MRA HEAD IMPRESSION:  1. Severely motion degraded exam. 2. No large vessel occlusion or obvious flow-limiting stenosis. 3. 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  CBC Latest Ref Rng & Units 07/25/2018 07/24/2018 07/24/2018  WBC 4.0 - 10.5 K/uL 10.3 - 8.8  Hemoglobin 12.0 -  15.0 g/dL 4.0(J) 8.1(X) 7.9(L)  Hematocrit 36.0 - 46.0 % 26.3(L) 27.8(L) 24.5(L)  Platelets 150 - 400 K/uL 161 - 133(L)   BMP Latest Ref Rng & Units 07/25/2018 07/24/2018 07/22/2018  Glucose 70 - 99 mg/dL 97 914(N) 829(F)  BUN 8 - 23 mg/dL 62(Z) 22 30(Q)  Creatinine 0.44 - 1.00 mg/dL 6.57(Q) 4.69(G) 2.95(M)  Sodium 135 - 145 mmol/L 140 139 139  Potassium 3.5 - 5.1 mmol/L 3.9 4.0 3.8  Chloride 98 - 111 mmol/L 108 107 104  CO2 22 - 32 mmol/L Calcium 8.9 - 10.3 mg/dL 8.9 8.4(X) 9.3    Discharge Instructions: Discharge Instructions    Ambulatory referral to Neurology   Complete by:  As directed    An appointment is requested in approximately: 4 weeks - this is Dr. Teofilo Pod  patient. Thanks   Call MD for:  persistant dizziness or light-headedness   Complete by:  As directed    Call MD for:  severe uncontrolled pain   Complete by:  As directed    Diet - low sodium heart healthy   Complete by:  As directed    Discharge instructions   Complete by:  As directed    Deanna Page,  It was a pleasure taking care of you at the hospital.  You were admitted because of fracture of your left arm that was repaired with surgery.  You are being discharged to skilled nursing facility where you undergo rehab therapy.  You were also found to have an old stroke and the neurologist had recommended for you to start taking aspirin and a cholesterol medication.  Take Care Dr. Dortha Schwalbe   Increase activity slowly   Complete by:  As directed       Signed: Yvette Rack, MD 07/25/2018, 11:38 AM   Pager: [phone]

## 2018-07-24 NOTE — Progress Notes (Signed)
   Subjective: No acute events overnight. POD#1 of ORIF of left humeral fracture. Appears comfortable on exam.   Objective:  Vital signs in last 24 hours: Vitals:   07/23/18 1558 07/23/18 2035 07/23/18 2323 07/24/18 0400  BP: 140/69 130/74 (!) 99/59 111/69  Pulse: 85 90 79 76  Resp: 16 15 18 16   Temp: 98.4 F (36.9 C) 98 F (36.7 C) 98.4 F (36.9 C) 98.7 F (37.1 C)  TempSrc: Oral Oral Oral Oral  SpO2: 100% 94% 94% 94%  Weight:      Height:       General: briefly arouses to voice, but falls back asleep; appears comfortable CV: RRR Abd: Soft, non-distended, non-tender Ext: Left arm immobilized in sling; surgical site appears c/d/i  Assessment/Plan:  Principal Problem:   Closed fracture of proximal end of left humerus, initial encounter Active Problems:   Essential hypertension   Parkinson's disease (HCC)   Chronic ischemic left PCA stroke  Deanna Page is an 85 y/o female with h/o Parkinson's Disease, CKD III, hypothyroidism who presented from her nursing facility after an unwitnessed fall. Found to have displaced left humeral fracture and underwent ORIF on 07/23/18. Neurology was also consulted due to CT head findings of PCA stroke of indeterminate chronicity. MRI confirmed that this was a chronic finding.   Displaced left humeral fracture - POD#1 of ORIF. Pain is well controlled. Post-op blood counts are stable.  - appreciate ortho, PT/OT recommendations; could potentially return to SNF later today  - given that this is a fragility fracture will initiate weekly Alendronate 70 mg   Chronic PCA infarct - stroke work-up initiated on admission due to findings on initial head CT; MRI later confirmed that this was a chronic finding  - carotid dopplers without significant obstruction. Lower extremity dopplers negative for DVT. Echo with impaired diastolic function, preserved EF, no atrial enlargement or valvular abnormalities.  - appreciate SLP evaluation. MBS done yesterday  which demonstrated mild oral dysphagia with aspiration. Continue dysphagia 2 diet - continue aspirin and high intensity statin   AKI on CKD III - resolved; crt stable at 1.0   Parkinson's disease Dementia  - continue home Sinemet and Namenda  Dispo: Patient is medically stable to return to [facility] to continue rehab.   Lenward Chancellor D, DO 07/24/2018, 7:25 AM Pager: [phone]

## 2018-07-24 NOTE — Progress Notes (Signed)
PHARMACIST - PHYSICIAN COMMUNICATION  CONCERNING: P&T Medication Policy Regarding Oral Bisphosphonates  RECOMMENDATION: Your order for alendronate (Fosamax), ibandronate (Boniva), or risedronate (Actonel) has been discontinued at this time.  If the patient's post-hospital medical condition warrants safe use of this class of drugs, please resume the pre-hospital regimen upon discharge.  DESCRIPTION:  Alendronate (Fosamax), ibandronate (Boniva), and risedronate (Actonel) can cause severe esophageal erosions in patients who are unable to remain upright at least 30 minutes after taking this medication.   Since brief interruptions in therapy are thought to have minimal impact on bone mineral density, the Pharmacy & Therapeutics Committee has established that bisphosphonate orders should be routinely discontinued during hospitalization.   To override this safety policy and permit administration of Boniva, Fosamax, or Actonel in the hospital, prescribers must write "DO NOT HOLD" in the comments section when placing the order for this class of medications.  Koehn Salehi D. Laney Potash, PharmD, BCPS, BCCCP 07/24/2018, 9:51 AM

## 2018-07-24 NOTE — Progress Notes (Signed)
Orthopedic Tech Progress Note Patient Details:  [place] Keune [facility] 03/08/1929 [id number]  Ortho Devices Type of Ortho Device: Shoulder immobilizer       Saul Fordyce 07/24/2018, 1:08 PM

## 2018-07-25 DIAGNOSIS — Z888 Allergy status to other drugs, medicaments and biological substances status: Secondary | ICD-10-CM

## 2018-07-25 DIAGNOSIS — Z887 Allergy status to serum and vaccine status: Secondary | ICD-10-CM

## 2018-07-25 DIAGNOSIS — W19XXXA Unspecified fall, initial encounter: Secondary | ICD-10-CM

## 2018-07-25 DIAGNOSIS — Z880 Allergy status to penicillin: Secondary | ICD-10-CM

## 2018-07-25 DIAGNOSIS — D509 Iron deficiency anemia, unspecified: Secondary | ICD-10-CM

## 2018-07-25 DIAGNOSIS — R339 Retention of urine, unspecified: Secondary | ICD-10-CM

## 2018-07-25 DIAGNOSIS — Z881 Allergy status to other antibiotic agents status: Secondary | ICD-10-CM

## 2018-07-25 DIAGNOSIS — Y92129 Unspecified place in nursing home as the place of occurrence of the external cause: Secondary | ICD-10-CM

## 2018-07-25 LAB — CBC
HCT: 26.3 % — ABNORMAL LOW (ref 36.0–46.0)
Hemoglobin: 8.4 g/dL — ABNORMAL LOW (ref 12.0–15.0)
MCH: 31 pg (ref 26.0–34.0)
MCHC: 31.9 g/dL (ref 30.0–36.0)
MCV: 97 fL (ref 80.0–100.0)
Platelets: 161 10*3/uL (ref 150–400)
RBC: 2.71 MIL/uL — ABNORMAL LOW (ref 3.87–5.11)
RDW: 13.5 % (ref 11.5–15.5)
WBC: 10.3 10*3/uL (ref 4.0–10.5)
nRBC: 0 % (ref 0.0–0.2)

## 2018-07-25 LAB — BASIC METABOLIC PANEL
Anion gap: 7 (ref 5–15)
BUN: 24 mg/dL — ABNORMAL HIGH (ref 8–23)
CO2: 25 mmol/L (ref 22–32)
Calcium: 8.9 mg/dL (ref 8.9–10.3)
Chloride: 108 mmol/L (ref 98–111)
Creatinine, Ser: 1.1 mg/dL — ABNORMAL HIGH (ref 0.44–1.00)
GFR calc Af Amer: 53 mL/min — ABNORMAL LOW (ref >60)
GFR calc non Af Amer: 45 mL/min — ABNORMAL LOW (ref >60)
Glucose, Bld: 97 mg/dL (ref 70–99)
Potassium: 3.9 mmol/L (ref 3.5–5.1)
Sodium: 140 mmol/L (ref 135–145)

## 2018-07-25 LAB — FERRITIN: Ferritin: 79 ng/mL (ref 11–307)

## 2018-07-25 LAB — IRON AND TIBC
Iron: 22 ug/dL — ABNORMAL LOW (ref 28–170)
Saturation Ratios: 8 % — ABNORMAL LOW (ref 10.4–31.8)
TIBC: 262 ug/dL (ref 250–450)
UIBC: 240 ug/dL

## 2018-07-25 LAB — PHOSPHORUS: Phosphorus: 2 mg/dL — ABNORMAL LOW (ref 2.5–4.6)

## 2018-07-25 LAB — TSH: TSH: 9.055 u[IU]/mL — ABNORMAL HIGH (ref 0.350–4.500)

## 2018-07-25 LAB — PREALBUMIN: Prealbumin: 10.7 mg/dL — ABNORMAL LOW (ref 18–38)

## 2018-07-25 LAB — MAGNESIUM: Magnesium: 1.7 mg/dL (ref 1.7–2.4)

## 2018-07-25 MED ORDER — ASPIRIN 325 MG PO TBEC: 325.0000 mg | DELAYED_RELEASE_TABLET | Freq: Every day | ORAL | 3 refills | Status: DC

## 2018-07-25 MED ORDER — PRAVASTATIN SODIUM 20 MG PO TABS: 20.0000 mg | ORAL_TABLET | Freq: Every day | ORAL | 0 refills | Status: DC

## 2018-07-25 MED ORDER — SODIUM CHLORIDE 0.9 % IV SOLN
510.0000 mg | Freq: Once | INTRAVENOUS | Status: AC
  Administered 2018-07-25: 510 mg via INTRAVENOUS
  Filled 2018-07-25: qty 17

## 2018-07-25 NOTE — Progress Notes (Signed)
  Date: 07/25/2018  Patient name: Naesha Yeung [facility]  Medical record number: [id number]  Date of birth: 1929/10/21   I have seen and evaluated this patient and I have discussed the plan of care with the house staff. Please see Dr. Verdell Face note for complete details. I concur with his findings and plan.     Inez Catalina, MD 07/25/2018, 11:26 AM

## 2018-07-25 NOTE — Progress Notes (Signed)
Subjective: Patient minimally interactive.  Resting comfortably.  Objective:   VITALS:   Vitals:   07/24/18 1909 07/25/18 0439 07/25/18 0745 07/25/18 0856  BP: 131/81 (!) 142/85 (!) 92/52 (!) 105/54  Pulse: 91 85 66 66  Resp: 18 20 17    Temp: 97.7 F (36.5 C) 98.3 F (36.8 C) 98.1 F (36.7 C)   TempSrc: Oral Oral Axillary   SpO2: 98% 100% 97%   Weight:      Height:       CBC Latest Ref Rng & Units 07/25/2018 07/24/2018 07/24/2018  WBC 4.0 - 10.5 K/uL 10.3 - 8.8  Hemoglobin 12.0 - 15.0 g/dL 9.5(A8.4(L) 2.1(H9.1(L) 7.9(L)  Hematocrit 36.0 - 46.0 % 26.3(L) 27.8(L) 24.5(L)  Platelets 150 - 400 K/uL 161 - 133(L)   BMP Latest Ref Rng & Units 07/25/2018 07/24/2018 07/22/2018  Glucose 70 - 99 mg/dL 97 086(V118(H) 784(O110(H)  BUN 8 - 23 mg/dL 96(E24(H) 22 95(M33(H)  Creatinine 0.44 - 1.00 mg/dL 8.41(L1.10(H) 2.44(W1.02(H) 1.02(V1.41(H)  Sodium 135 - 145 mmol/L 140 139 139  Potassium 3.5 - 5.1 mmol/L 3.9 4.0 3.8  Chloride 98 - 111 mmol/L 108 107 104  CO2 22 - 32 mmol/L 25 24 25   Calcium 8.9 - 10.3 mg/dL 8.9 2.5(D8.6(L) 9.3   Intake/Output      05/22 0701 - 05/23 0700 05/23 0701 - 05/24 0700   P.O. 300    I.V. (mL/kg)     Other     IV Piggyback     Total Intake(mL/kg) 300 (5.6)    Urine (mL/kg/hr)     Blood     Total Output     Net +300         Urine Occurrence 403 x    Stool Occurrence 1 x       Physical Exam: General: NAD.  Asleep on arrival.  Appears comfortable.  Not interactive.  MSK LUE: Sling in place.  Significant ecchymosis.  Upper arm is soft.  Dressing CDI.  Hand warm.  Exam limited due to lack of interaction, but gross sensory and motor function appeared to be intact.    Assessment / Plan: 2 Days Post-Op  S/P Procedure(s) (LRB): OPEN REDUCTION INTERNAL FIXATION (ORIF) PROXIMAL HUMERUS FRACTURE (Left) by Dr. Carola FrostHandy on 07/23/2018  Principal Problem:   Closed fracture of proximal end of left humerus, initial encounter Active Problems:   Essential hypertension   Parkinson's disease (HCC)   Chronic  ischemic left PCA stroke   Left proximal humerus fracture, status post ORIF Stable from an orthopedic perspective NWB LUE Sling when not working with therapy Pendulums/gentle passive shoulder motion okay Ice PRN Dressings PRN PT OT  Pain control: Tylenol.  Minimize narcotics.  ABLA: Hemoglobin down slightly.  7.9>9.1> 8.4.  No sign of active bleeding.  BP soft but not tachycardic.  Platelets WNL.  Medical issues per medicine team  DVT/PE prophylaxis: Not required from an orthopedic standpoint  Metabolic bone disease/fragility fracture: Per orthopedic trauma service: "pts fracture is consistent with fragility fracture and do think she could benefit from pharmacologic treatment of her poor bone quality.  However, we (orthopaedic trauma service) would not recommend the initiation of bisphosphonates during the acute healing phase of a fracture.  The literature is too conflicting to support the use of bisphosphonates in the acute healing phase and event then the literature tends to lean towards Zoledronic Acid.             We would recommend metabolic bone work up including  labs and outpatient dexa scan before starting on any  Medication              Is suspect her bone quality is to the point where she really needs an anabolic agent rather than an anti-resorptive agent              We freely acknowledge that we may be biased against bisphosphonates as we regularly treat the atypical fractures associated with them and they are extremely difficult to treat. I do think that bisphosphonates have a role in the treatment of osteoporosis but not in the acute fracture population              We have the opportunity to work up the patient and determine the most appropriate treatment protocol to address her bone disease."   Follow - up plan: Follow-up with Dr. Carola Frost 1-2 weeks  Contact information:  Margarita Rana MD, Aquilla Hacker PA-C   Dispo: Plan for return to [facility].  PT/OT.   Osteoporosis work-up.     Lucretia Kern Martensen III, PA-C 07/25/2018, 10:02 AM

## 2018-07-25 NOTE — Progress Notes (Addendum)
.     Subjective: HD#4   Overnight: Confused, try to get out of bed but no reports of agitation  Today, Deanna Page  Was examined at bedside and was pleasantly lying in bed. She is alert to self only and denies pain.   Objective:  Vital signs in last 24 hours: Vitals:   07/24/18 1151 07/24/18 1540 07/24/18 1909 07/25/18 0439  BP: (!) 93/46 100/69 131/81 (!) 142/85  Pulse: 72 81 91 85  Resp: 16 16 18 20   Temp: 97.9 F (36.6 C) (!) 97.5 F (36.4 C) 97.7 F (36.5 C) 98.3 F (36.8 C)  TempSrc: Axillary Oral Oral Oral  SpO2: 99% 99% 98% 100%  Weight:      Height:        Const: In NAD, lying in bed, very pleasant HEENT: RRR, no MGR Abd:BS+, NTTP Ext:L. Arm in sling   Assessment/Plan:  Principal Problem:   Closed fracture of proximal end of left humerus, initial encounter Active Problems:   Essential hypertension   Parkinson's disease (HCC)   Chronic ischemic left PCA stroke  Ms. Mifflin is an 85 y/o female with h/o Parkinson's Disease, CKD III, hypothyroidism who presented from her nursing facility after an unwitnessed fall. Found to have displaced left humeral fracture and underwent ORIF on 07/23/18. Neurology was also consulted due to CT head findings of PCA stroke of indeterminate chronicity. MRI confirmed that this was a chronic finding.   Displaced left humeral fracture She is postop day #2 of ORIF.  Her pain is currently being managed with Tylenol, IV IV morphine 1-3 mg q4hrs prn. Overnight, only required IV morphine 1mg .  - POD#2 of ORIF. - Will initiate weekly Alendronate 70 mg for fragility fracture but wait for surgical wound to heal - Follow-up ionized calcium, PTH, and vitamin D 25 levels.  Chronic PCA infarct Stroke work-up initiated on admission due to findings on initial head CT; MRI later confirmed that this was a chronic finding. Carotid dopplers without significant obstruction. Lower extremity dopplers negative for DVT. Echo with impaired  diastolic function, preserved EF, no atrial enlargement or valvular abnormalities.MBS done yesterday which demonstrated mild oral dysphagia with aspiration.  - Continue dysphagia 2 diet - Continue aspirin and high intensity statin   AKI on CKD III-stable - sCr 1.1<<1  Acute Urinary retention: Likely due to narcotids from recent surgery. Bladder scan with 670cc.  - In and out cath - PVR - Bladder scan q3 hours   Parkinson's disease Dementia  - continue home Sinemet and Namenda  Anemia secondary to iron deficiency vs anemia of chronic disease vs blood loss anemia: Hemoglobin this a.m. of 8.4<<9.1<<7.9.  Iron studies reveals low iron of 22, low saturation 8 and ferritin 79. - Will administer 1 time dose of IV feraheme prior to discharge  Dispo: Patient is medically stable to return to [facility] to continue rehab.   Yvette Rack, MD 07/25/2018, 6:03 AM Pager: [phone] IMTS PGY-1

## 2018-07-25 NOTE — TOC Transition Note (Signed)
Transition of Care [facility]) - CM/SW Discharge Note   Patient Details  Name: Deanna Page MRN: [id number] Date of Birth: [birth year]  Transition of Care [facility]) CM/SW Contact:  Gwenlyn Fudge, LCSWA Phone Number: 07/25/2018, 11:39 AM   Clinical Narrative:     Patient will DC to: [facility] Anticipated DC date: 07/25/2018 Family notified: Yes Transport by: Sharin Mons   Per MD patient ready for DC to . RN, patient, patient's family, and facility notified of DC. Discharge Summary and FL2 sent to facility. RN to call report prior to discharge [phone]). The patient will report to room 311.  DC packet on chart. Ambulance transport requested for patient.   CSW will sign off for now as social work intervention is no longer needed. Please consult [place] again if new needs arise.  Kathie Posa, LCSW-A [place]/Clinical Social Work Department Cell: [phone]     Final next level of care: Skilled Nursing Facility Barriers to Discharge: No Barriers Identified   Patient Goals and CMS Choice Patient states their goals for this hospitalization and ongoing recovery are:: Pt will return to [facility].gov Compare Post Acute Care list provided to:: Other (Comment Required)(Pt will return to [facility]) Choice offered to / list presented to : NA  Discharge Placement   Existing PASRR number confirmed : 07/23/18          Patient chooses bed at: [facility] Patient to be transferred to facility by: [facility] Name of family member notified: Marcelino Duster, Daughter Patient and family notified of of transfer: 07/25/18  Discharge Plan and Services     Post Acute Care Choice: Skilled Nursing Facility          DME Arranged: N/A DME Agency: NA       HH Arranged: NA HH Agency: NA        Social Determinants of Health (SDOH) Interventions     Readmission Risk Interventions Readmission Risk Prevention Plan 07/24/2018  Transportation Screening Complete   PCP or Specialist Appt within 3-5 Days Complete  HRI or Home Care Consult Not Complete  HRI or Home Care Consult comments Admission to SNF  Social Work Consult for Recovery Care Planning/Counseling Complete  Palliative Care Screening Not Applicable  Some recent data might be hidden

## 2018-07-25 NOTE — Progress Notes (Signed)
Pt awake, trying to get out of bed, not combative but very confused, on low bed, bed alarms on, high fall risk precautions in place, continuous monitoring throughout the shift.

## 2018-07-26 LAB — PTH, INTACT AND CALCIUM
Calcium, Total (PTH): 9 mg/dL (ref 8.7–10.3)
PTH: 25 pg/mL (ref 15–65)

## 2018-07-26 LAB — CALCIUM, IONIZED: Calcium, Ionized, Serum: 5.1 mg/dL (ref 4.5–5.6)

## 2018-07-27 LAB — VITAMIN D 25 HYDROXY (VIT D DEFICIENCY, FRACTURES): Vit D, 25-Hydroxy: 27.3 ng/mL — ABNORMAL LOW (ref 30.0–100.0)

## 2018-07-28 LAB — CALCITRIOL (1,25 DI-OH VIT D): Vit D, 1,25-Dihydroxy: 27.5 pg/mL (ref 19.9–79.3)

## 2018-09-27 ENCOUNTER — Emergency Department (HOSPITAL_COMMUNITY): Payer: Medicare (Managed Care)

## 2018-09-27 ENCOUNTER — Other Ambulatory Visit: Payer: Self-pay

## 2018-09-27 ENCOUNTER — Emergency Department (HOSPITAL_COMMUNITY)
Admission: EM | Admit: 2018-09-27 | Discharge: 2018-09-27 | Disposition: A | Discharge status: Home / Self Care | Payer: Medicare (Managed Care) | Attending: Emergency Medicine | Admitting: Emergency Medicine

## 2018-09-27 DIAGNOSIS — Y92129 Unspecified place in nursing home as the place of occurrence of the external cause: Secondary | ICD-10-CM | POA: Insufficient documentation

## 2018-09-27 DIAGNOSIS — I129 Hypertensive chronic kidney disease with stage 1 through stage 4 chronic kidney disease, or unspecified chronic kidney disease: Secondary | ICD-10-CM | POA: Insufficient documentation

## 2018-09-27 DIAGNOSIS — S8002XA Contusion of left knee, initial encounter: Secondary | ICD-10-CM

## 2018-09-27 DIAGNOSIS — N183 Chronic kidney disease, stage 3 (moderate): Secondary | ICD-10-CM | POA: Insufficient documentation

## 2018-09-27 DIAGNOSIS — Z8673 Personal history of transient ischemic attack (TIA), and cerebral infarction without residual deficits: Secondary | ICD-10-CM | POA: Insufficient documentation

## 2018-09-27 DIAGNOSIS — E039 Hypothyroidism, unspecified: Secondary | ICD-10-CM | POA: Diagnosis not present

## 2018-09-27 DIAGNOSIS — S0083XA Contusion of other part of head, initial encounter: Secondary | ICD-10-CM

## 2018-09-27 DIAGNOSIS — Y998 Other external cause status: Secondary | ICD-10-CM | POA: Diagnosis not present

## 2018-09-27 DIAGNOSIS — G20 Parkinson's disease: Secondary | ICD-10-CM | POA: Insufficient documentation

## 2018-09-27 DIAGNOSIS — Y939 Activity, unspecified: Secondary | ICD-10-CM | POA: Diagnosis not present

## 2018-09-27 DIAGNOSIS — Z79899 Other long term (current) drug therapy: Secondary | ICD-10-CM | POA: Insufficient documentation

## 2018-09-27 DIAGNOSIS — S0990XA Unspecified injury of head, initial encounter: Secondary | ICD-10-CM | POA: Diagnosis present

## 2018-09-27 DIAGNOSIS — W19XXXA Unspecified fall, initial encounter: Secondary | ICD-10-CM

## 2018-09-27 DIAGNOSIS — F0390 Unspecified dementia without behavioral disturbance: Secondary | ICD-10-CM | POA: Diagnosis not present

## 2018-09-27 DIAGNOSIS — Z7982 Long term (current) use of aspirin: Secondary | ICD-10-CM | POA: Insufficient documentation

## 2018-09-27 LAB — URINALYSIS, ROUTINE W REFLEX MICROSCOPIC
Bilirubin Urine: NEGATIVE
Glucose, UA: NEGATIVE mg/dL
Hgb urine dipstick: NEGATIVE
Ketones, ur: NEGATIVE mg/dL
Leukocytes,Ua: NEGATIVE
Nitrite: NEGATIVE
Protein, ur: NEGATIVE mg/dL
Specific Gravity, Urine: 1.016 (ref 1.005–1.030)
pH: 5 (ref 5.0–8.0)

## 2018-09-27 LAB — CBC
HCT: 34.1 % — ABNORMAL LOW (ref 36.0–46.0)
Hemoglobin: 11 g/dL — ABNORMAL LOW (ref 12.0–15.0)
MCH: 32.9 pg (ref 26.0–34.0)
MCHC: 32.3 g/dL (ref 30.0–36.0)
MCV: 102.1 fL — ABNORMAL HIGH (ref 80.0–100.0)
Platelets: 123 10*3/uL — ABNORMAL LOW (ref 150–400)
RBC: 3.34 MIL/uL — ABNORMAL LOW (ref 3.87–5.11)
RDW: 14.6 % (ref 11.5–15.5)
WBC: 7.8 10*3/uL (ref 4.0–10.5)
nRBC: 0 % (ref 0.0–0.2)

## 2018-09-27 LAB — BASIC METABOLIC PANEL
Anion gap: 8 (ref 5–15)
BUN: 30 mg/dL — ABNORMAL HIGH (ref 8–23)
CO2: 23 mmol/L (ref 22–32)
Calcium: 9 mg/dL (ref 8.9–10.3)
Chloride: 105 mmol/L (ref 98–111)
Creatinine, Ser: 1.41 mg/dL — ABNORMAL HIGH (ref 0.44–1.00)
GFR calc Af Amer: 39 mL/min — ABNORMAL LOW (ref >60)
GFR calc non Af Amer: 34 mL/min — ABNORMAL LOW (ref >60)
Glucose, Bld: 107 mg/dL — ABNORMAL HIGH (ref 70–99)
Potassium: 4.6 mmol/L (ref 3.5–5.1)
Sodium: 136 mmol/L (ref 135–145)

## 2018-09-27 MED ORDER — SODIUM CHLORIDE 0.9 % IV BOLUS
500.0000 mL | Freq: Once | INTRAVENOUS | Status: AC
  Administered 2018-09-27: 500 mL via INTRAVENOUS

## 2018-09-27 MED ORDER — LORAZEPAM 2 MG/ML IJ SOLN
1.0000 mg | Freq: Once | INTRAMUSCULAR | Status: AC
  Administered 2018-09-27: 1 mg via INTRAVENOUS
  Filled 2018-09-27: qty 1

## 2018-09-27 MED ORDER — LORAZEPAM 2 MG/ML IJ SOLN: 1.0000 mg | Freq: Once | INTRAMUSCULAR | Status: DC

## 2018-09-27 NOTE — ED Provider Notes (Signed)
[facility] Provider Note   CSN: [id number] Arrival date & time: 09/27/18  1415     History   Chief Complaint No chief complaint on file.   HPI Deanna Page is a 85 y.o. female.       HPI  85 yo female from assisted living facility presents today with report of fall.  Unclear from EMS report, when fall occurred.  Patient is unable to give any further history. Call placed to facility but rings then busy signal at number listed.   Past Medical History:  Diagnosis Date  . Allergic rhinitis   . Anxiety   . Asthma   . Bowel obstruction (HCC)   . CKD (chronic kidney disease), stage III (HCC)   . Dementia (HCC)   . Diverticulosis   . Esophageal dysmotility   . Esophageal stricture 2005  . GERD (gastroesophageal reflux disease)   . Hepatitis A   . Hiatal hernia   . Hypertension   . Hypothyroidism   . Internal hemorrhoids   . Irritable bowel syndrome   . Ischemic colitis, enteritis, or enterocolitis (HCC) 2010  . Major depression    with paranoia and insomnia  . OSA (obstructive sleep apnea)   . Parkinson disease (HCC)   . Pneumonia   . TIA (transient ischemic attack)   . Urinary incontinence   . Vitamin D deficiency     Patient Active Problem List   Diagnosis Date Noted  . Fall at nursing home   . Chronic ischemic left PCA stroke 07/21/2018  . Closed fracture of proximal end of left humerus, initial encounter 07/21/2018  . Hypothyroidism 07/21/2018  . CKD (chronic kidney disease), stage III (HCC) 07/21/2018  . Suicidal ideation   . Dementia (HCC) 12/30/2016  . Parkinson's disease (HCC) 03/01/2013  . Essential hypertension 06/02/2006    Past Surgical History:  Procedure Laterality Date  . ABDOMINAL HYSTERECTOMY  1969  . APPENDECTOMY  1969  . BLADDER SURGERY    . BREAST LUMPECTOMY  1995  . CATARACT EXTRACTION    . CHOLECYSTECTOMY  2000  . CORNEAL TRANSPLANT Left   . INTRAOCULAR PROSTHESES INSERTION     [facility]  . ORIF HUMERUS FRACTURE Left 07/23/2018   Procedure: OPEN REDUCTION INTERNAL FIXATION (ORIF) PROXIMAL HUMERUS FRACTURE;  Surgeon: Myrene GalasHandy, Michael, MD;  Location: [facility] OR;  Service: Orthopedics;  Laterality: Left;  . TUBAL LIGATION  1968     OB History   No obstetric history on file.      Home Medications    Prior to Admission medications   Medication Sig Start Date End Date Taking? Authorizing Provider  acetaminophen (TYLENOL) 325 MG tablet Take 650 mg by mouth 2 (two) times daily as needed for moderate pain.    [provider]  alum & mag hydroxide-simeth (MAALOX/MYLANTA) 200-200-20 MG/5ML suspension Take 15 mLs by mouth 2 (two) times daily as needed for indigestion or heartburn.    [provider]  antiseptic oral rinse (BIOTENE) LIQD 15 mLs by Mouth Rinse route 2 (two) times daily as needed for dry mouth.    [provider]  aspirin EC 325 MG EC tablet Take 1 tablet (325 mg total) by mouth daily. 07/26/18   Yvette RackAgyei, Obed K, MD  atenolol (TENORMIN) 25 MG tablet Take 25 mg by mouth 2 (two) times daily.     [provider]  carbidopa-levodopa (SINEMET IR) 25-100 MG tablet Take 1.5 tablets by mouth 4 (four) times  daily.    [provider]  Carbidopa-Levodopa ER (SINEMET CR) 25-100 MG tablet controlled release Take 1 tablet by mouth daily.     [provider]  citalopram (CELEXA) 20 MG tablet Take 1 tablet (20 mg total) by mouth at bedtime. Patient taking differently: Take 30 mg by mouth daily.  10/09/15   Huston FoleyAthar, Saima, MD  clonazePAM (KLONOPIN) 0.5 MG tablet Take 1 tablet (0.5 mg total) by mouth 3 (three) times daily. 05/27/18   Sharee HolsterGreen, Deborah S, NP  fluorometholone (FML) 0.1 % ophthalmic suspension Place 1 drop into the left eye daily.    [provider]  levothyroxine (SYNTHROID, LEVOTHROID) 50 MCG tablet Take 50 mcg by mouth daily before breakfast.    [provider]  memantine (NAMENDA) 10 MG tablet Take 10 mg  by mouth 2 (two) times daily.     [provider]  Nutritional Supplements (ENSURE ORIGINAL) LIQD Take 8 oz by mouth 3 (three) times daily.     [provider]  pantoprazole (PROTONIX) 20 MG tablet Take 2 tablets (40 mg total) by mouth daily. 02/12/18   Theotis BarrioLee, Joshua K, MD  perphenazine (TRILAFON) 4 MG tablet Take 4 mg by mouth 2 (two) times daily.    [provider]  polyvinyl alcohol (LIQUIFILM TEARS) 1.4 % ophthalmic solution Place 1 drop into both eyes 4 (four) times daily as needed for dry eyes.     [provider]  pravastatin (PRAVACHOL) 20 MG tablet Take 1 tablet (20 mg total) by mouth daily at 6 PM. 07/25/18   Agyei, Hermina Staggersbed K, MD  sennosides-docusate sodium (SENOKOT-S) 8.6-50 MG tablet Take 2 tablets by mouth at bedtime.    [provider]  sodium fluoride (PREVIDENT 5000 PLUS) 1.1 % CREA dental cream Place 1 application onto teeth every evening.    [provider]  trimethoprim (TRIMPEX) 100 MG tablet Take 100 mg by mouth daily.    [provider]  Vitamin D, Cholecalciferol, 1000 units CAPS Take 1 Units by mouth daily.     [provider]    Family History Family History  Problem Relation Age of Onset  . Stroke Mother   . Parkinson's disease Mother   . Lung cancer Father   . ALS Sister   . Liver disease Sister   . Parkinson's disease Maternal Grandmother   . Parkinson's disease Maternal Aunt   . Emphysema Sister   . Emphysema Brother   . Colon cancer Neg Hx   . Colon polyps Neg Hx   . Diabetes Neg Hx   . Gallbladder disease Neg Hx   . Esophageal cancer Neg Hx     Social History Social History   Tobacco Use  . Smoking status: Never Smoker  . Smokeless tobacco: Never Used  Substance Use Topics  . Alcohol use: No    Alcohol/week: 0.0 standard drinks  . Drug use: No     Allergies   Erythromycin, Penicillins, Aricept [donepezil hcl], Clindamycin/lincomycin, Doxycycline, and Pneumococcal vaccines    Review of Systems Review of Systems   Physical Exam Updated Vital Signs BP 130/69 (BP Location: Left Arm)   Pulse 64   Temp 99.5 F (37.5 C) (Rectal)   Resp (!) 22   SpO2 96%   Physical Exam Vitals signs and nursing note reviewed.  Constitutional:      General: She is not in acute distress.    Appearance: Normal appearance.  HENT:     Head: Normocephalic.  Comments: Swelling c.w. contusion right forehead    Right Ear: External ear normal.     Left Ear: External ear normal.     Nose: Nose normal.     Mouth/Throat:     Mouth: Mucous membranes are moist.  Eyes:     Extraocular Movements: Extraocular movements intact.     Pupils: Pupils are equal, round, and reactive to light.  Neck:     Musculoskeletal: Normal range of motion.  Cardiovascular:     Rate and Rhythm: Normal rate and regular rhythm.     Pulses: Normal pulses.  Pulmonary:     Effort: Pulmonary effort is normal.     Breath sounds: Normal breath sounds.  Abdominal:     General: Abdomen is flat.     Palpations: Abdomen is soft.  Musculoskeletal: Normal range of motion.     Comments: Contusion left knee with mild tenderness Full active range of motion bilateral hips and knees Full active range of motion right shoulder, elbow, wrist there is a contusion on the right upper arm and skin tears on the right forearm. Left shoulder with some decreased range of motion which patient states is secondary to previous injury obvious signs of trauma noted  Skin:    General: Skin is warm and dry.     Capillary Refill: Capillary refill takes less than 2 seconds.  Neurological:     General: No focal deficit present.     Mental Status: She is alert and oriented to person, place, and time.     Cranial Nerves: No cranial nerve deficit.     Motor: No weakness.     Coordination: Coordination normal.  Psychiatric:        Mood and Affect: Mood normal.        Behavior: Behavior normal.      ED Treatments / Results  Labs  (all labs ordered are listed, but only abnormal results are displayed) Labs Reviewed - No data to display  EKG EKG Interpretation  Date/Time:  Sunday September 27 2018 14:16:47 EDT Ventricular Rate:  63 PR Interval:    QRS Duration: 87 QT Interval:  444 QTC Calculation: 455 R Axis:   3 Text Interpretation:  Sinus rhythm Short PR interval Probable anteroseptal infarct, recent No significant change since last tracing Tremors present  Confirmed by Wandra Arthurs [id number]) on 09/27/2018 3:02:16 PM   Radiology No results found.  Procedures Procedures (including critical care time)  Medications Ordered in ED Medications - No data to display   Initial Impression / Assessment and Plan / ED Course  I have reviewed the triage vital signs and the nursing notes.  Pertinent labs & imaging results that were available during my care of the patient were reviewed by me and considered in my medical decision making (see chart for details).        Labs and radiology studies ordered Discussed with Dr. Darl Householder he will follow-up Attempted calls to facility unable to reach facility Final Clinical Impressions(s) / ED Diagnoses   Final diagnoses:  Fall, initial encounter  Contusion of face, initial encounter  Contusion of left knee, initial encounter    ED Discharge Orders    None       Pattricia Boss, MD 09/27/18 [phone]

## 2018-09-27 NOTE — ED Notes (Signed)
Updated pt husband 

## 2018-09-27 NOTE — Discharge Instructions (Signed)
Take tylenol 650 mg twice daily as needed for pain   Fall precautions.   See your doctor   Return to ER if you have worse pain, vomiting, another fall

## 2018-09-27 NOTE — ED Notes (Signed)
Patient transported to CT 

## 2018-09-27 NOTE — ED Provider Notes (Signed)
  Physical Exam  BP (!) 130/94   Pulse 64   Temp 99.5 F (37.5 C) (Rectal)   Resp 15   SpO2 97%   Physical Exam  ED Course/Procedures     Procedures  MDM  Patient care assumed at 3 PM.  Patient had a unwitnessed fall and is demented and is from a facility.  Patient has a abrasion on the right frontal scalp.  Also some extremity injuries as well.  Signout pending labs and CTs and x-rays.  Anticipate DC back to facility if everything is stable.  3:55 PM Cr 1.4, baseline around 1.1. UA normal. Xrays showed no fractures. Stable for dc home. I cleared cervical collar        Drenda Freeze, MD 09/27/18 1556

## 2018-09-27 NOTE — ED Triage Notes (Signed)
[facility] reports pt from [facility]. They were not able to tell her when, how or why the pt fell. She is on blood thinners. The pt has hx of Parkinson's and dementia. Pt reported no pain. VS stable

## 2018-09-27 NOTE — ED Notes (Signed)
Called pt husband Herbie Baltimore to update at [phone].

## 2018-09-27 NOTE — ED Notes (Signed)
Pt discharged to [facility] with [employer]

## 2018-09-28 LAB — URINE CULTURE: Culture: NO GROWTH

## 2018-11-23 ENCOUNTER — Ambulatory Visit: Payer: Medicare (Managed Care) | Admitting: Adult Health

## 2019-01-07 ENCOUNTER — Emergency Department (HOSPITAL_COMMUNITY): Payer: Medicare (Managed Care)

## 2019-01-07 ENCOUNTER — Encounter (HOSPITAL_COMMUNITY): Payer: Self-pay

## 2019-01-07 ENCOUNTER — Emergency Department (HOSPITAL_COMMUNITY)
Admission: EM | Admit: 2019-01-07 | Discharge: 2019-01-12 | Disposition: A | Discharge status: Home / Self Care | Payer: Medicare (Managed Care) | Attending: Emergency Medicine | Admitting: Emergency Medicine

## 2019-01-07 DIAGNOSIS — G20 Parkinson's disease: Secondary | ICD-10-CM | POA: Insufficient documentation

## 2019-01-07 DIAGNOSIS — Z8673 Personal history of transient ischemic attack (TIA), and cerebral infarction without residual deficits: Secondary | ICD-10-CM | POA: Insufficient documentation

## 2019-01-07 DIAGNOSIS — E039 Hypothyroidism, unspecified: Secondary | ICD-10-CM | POA: Diagnosis not present

## 2019-01-07 DIAGNOSIS — Z7982 Long term (current) use of aspirin: Secondary | ICD-10-CM | POA: Diagnosis not present

## 2019-01-07 DIAGNOSIS — Z20828 Contact with and (suspected) exposure to other viral communicable diseases: Secondary | ICD-10-CM | POA: Diagnosis not present

## 2019-01-07 DIAGNOSIS — N183 Chronic kidney disease, stage 3 unspecified: Secondary | ICD-10-CM | POA: Diagnosis not present

## 2019-01-07 DIAGNOSIS — F919 Conduct disorder, unspecified: Secondary | ICD-10-CM | POA: Diagnosis not present

## 2019-01-07 DIAGNOSIS — I129 Hypertensive chronic kidney disease with stage 1 through stage 4 chronic kidney disease, or unspecified chronic kidney disease: Secondary | ICD-10-CM | POA: Insufficient documentation

## 2019-01-07 DIAGNOSIS — J45909 Unspecified asthma, uncomplicated: Secondary | ICD-10-CM | POA: Insufficient documentation

## 2019-01-07 DIAGNOSIS — Z79899 Other long term (current) drug therapy: Secondary | ICD-10-CM | POA: Diagnosis not present

## 2019-01-07 DIAGNOSIS — F03918 Unspecified dementia, unspecified severity, with other behavioral disturbance: Secondary | ICD-10-CM

## 2019-01-07 DIAGNOSIS — F0390 Unspecified dementia without behavioral disturbance: Secondary | ICD-10-CM | POA: Diagnosis not present

## 2019-01-07 DIAGNOSIS — F0391 Unspecified dementia with behavioral disturbance: Secondary | ICD-10-CM

## 2019-01-07 LAB — COMPREHENSIVE METABOLIC PANEL
ALT: 24 U/L (ref 0–44)
AST: 32 U/L (ref 15–41)
Albumin: 4 g/dL (ref 3.5–5.0)
Alkaline Phosphatase: 86 U/L (ref 38–126)
Anion gap: 12 (ref 5–15)
BUN: 34 mg/dL — ABNORMAL HIGH (ref 8–23)
CO2: 19 mmol/L — ABNORMAL LOW (ref 22–32)
Calcium: 9.2 mg/dL (ref 8.9–10.3)
Chloride: 108 mmol/L (ref 98–111)
Creatinine, Ser: 1.34 mg/dL — ABNORMAL HIGH (ref 0.44–1.00)
GFR calc Af Amer: 41 mL/min — ABNORMAL LOW (ref >60)
GFR calc non Af Amer: 36 mL/min — ABNORMAL LOW (ref >60)
Glucose, Bld: 112 mg/dL — ABNORMAL HIGH (ref 70–99)
Potassium: 4.3 mmol/L (ref 3.5–5.1)
Sodium: 139 mmol/L (ref 135–145)
Total Bilirubin: 0.7 mg/dL (ref 0.3–1.2)
Total Protein: 7.2 g/dL (ref 6.5–8.1)

## 2019-01-07 LAB — BLOOD GAS, VENOUS
Acid-base deficit: 1.4 mmol/L (ref 0.0–2.0)
Bicarbonate: 23.1 mmol/L (ref 20.0–28.0)
O2 Saturation: 22.4 %
Patient temperature: 98.6
pCO2, Ven: 40.4 mmHg — ABNORMAL LOW (ref 44.0–60.0)
pH, Ven: 7.376 (ref 7.250–7.430)

## 2019-01-07 LAB — URINALYSIS, ROUTINE W REFLEX MICROSCOPIC
Bacteria, UA: NONE SEEN
Bilirubin Urine: NEGATIVE
Glucose, UA: NEGATIVE mg/dL
Hgb urine dipstick: NEGATIVE
Ketones, ur: 5 mg/dL — AB
Leukocytes,Ua: NEGATIVE
Nitrite: NEGATIVE
Protein, ur: NEGATIVE mg/dL
Specific Gravity, Urine: 1.017 (ref 1.005–1.030)
pH: 5 (ref 5.0–8.0)

## 2019-01-07 LAB — CBC WITH DIFFERENTIAL/PLATELET
Abs Immature Granulocytes: 0.09 10*3/uL — ABNORMAL HIGH (ref 0.00–0.07)
Basophils Absolute: 0.2 10*3/uL — ABNORMAL HIGH (ref 0.0–0.1)
Basophils Relative: 1 %
Eosinophils Absolute: 0.2 10*3/uL (ref 0.0–0.5)
Eosinophils Relative: 2 %
HCT: 34.4 % — ABNORMAL LOW (ref 36.0–46.0)
Hemoglobin: 10.8 g/dL — ABNORMAL LOW (ref 12.0–15.0)
Immature Granulocytes: 1 %
Lymphocytes Relative: 17 %
Lymphs Abs: 2.4 10*3/uL (ref 0.7–4.0)
MCH: 33.2 pg (ref 26.0–34.0)
MCHC: 31.4 g/dL (ref 30.0–36.0)
MCV: 105.8 fL — ABNORMAL HIGH (ref 80.0–100.0)
Monocytes Absolute: 1.4 10*3/uL — ABNORMAL HIGH (ref 0.1–1.0)
Monocytes Relative: 10 %
Neutro Abs: 9.8 10*3/uL — ABNORMAL HIGH (ref 1.7–7.7)
Neutrophils Relative %: 69 %
Platelets: 237 10*3/uL (ref 150–400)
RBC: 3.25 MIL/uL — ABNORMAL LOW (ref 3.87–5.11)
RDW: 14.2 % (ref 11.5–15.5)
WBC: 14.1 10*3/uL — ABNORMAL HIGH (ref 4.0–10.5)
nRBC: 0 % (ref 0.0–0.2)

## 2019-01-07 MED ORDER — MEMANTINE HCL 10 MG PO TABS
10.0000 mg | ORAL_TABLET | Freq: Two times a day (BID) | ORAL | Status: DC
  Administered 2019-01-08 – 2019-01-12 (×7): 10 mg via ORAL
  Filled 2019-01-07 (×11): qty 1

## 2019-01-07 MED ORDER — CARBIDOPA-LEVODOPA 25-100 MG PO TABS
1.5000 | ORAL_TABLET | Freq: Three times a day (TID) | ORAL | Status: DC
  Administered 2019-01-08 – 2019-01-12 (×10): 1.5 via ORAL
  Filled 2019-01-07 (×15): qty 1.5

## 2019-01-07 MED ORDER — PANTOPRAZOLE SODIUM 40 MG PO TBEC
40.0000 mg | DELAYED_RELEASE_TABLET | Freq: Every day | ORAL | Status: DC
  Administered 2019-01-08 – 2019-01-12 (×4): 40 mg via ORAL
  Filled 2019-01-07 (×4): qty 1

## 2019-01-07 MED ORDER — LORAZEPAM 2 MG/ML IJ SOLN
1.0000 mg | Freq: Once | INTRAMUSCULAR | Status: AC
  Administered 2019-01-07: 1 mg via INTRAMUSCULAR
  Filled 2019-01-07: qty 1

## 2019-01-07 MED ORDER — CLONAZEPAM 0.5 MG PO TABS
0.5000 mg | ORAL_TABLET | ORAL | Status: DC
  Administered 2019-01-08: 12:00:00 0.5 mg via ORAL
  Administered 2019-01-11 – 2019-01-12 (×2): 1 mg via ORAL
  Filled 2019-01-07: qty 2
  Filled 2019-01-07: qty 1
  Filled 2019-01-07: qty 2

## 2019-01-07 MED ORDER — ASPIRIN 81 MG PO CHEW
81.0000 mg | CHEWABLE_TABLET | Freq: Every day | ORAL | Status: DC
  Administered 2019-01-08 – 2019-01-12 (×4): 81 mg via ORAL
  Filled 2019-01-07 (×4): qty 1

## 2019-01-07 MED ORDER — LEVOTHYROXINE SODIUM 50 MCG PO TABS
50.0000 ug | ORAL_TABLET | Freq: Every day | ORAL | Status: DC
  Administered 2019-01-08 – 2019-01-12 (×5): 50 ug via ORAL
  Filled 2019-01-07 (×5): qty 1

## 2019-01-07 MED ORDER — TRIMETHOPRIM 100 MG PO TABS
100.0000 mg | ORAL_TABLET | Freq: Every day | ORAL | Status: DC
  Administered 2019-01-08 – 2019-01-12 (×4): 100 mg via ORAL
  Filled 2019-01-07 (×5): qty 1

## 2019-01-07 MED ORDER — CITALOPRAM HYDROBROMIDE 10 MG PO TABS
20.0000 mg | ORAL_TABLET | Freq: Every day | ORAL | Status: DC
  Administered 2019-01-08 – 2019-01-11 (×3): 20 mg via ORAL
  Filled 2019-01-07 (×3): qty 2

## 2019-01-07 MED ORDER — CARBIDOPA-LEVODOPA ER 25-100 MG PO TBCR
1.0000 | EXTENDED_RELEASE_TABLET | Freq: Every day | ORAL | Status: DC
  Administered 2019-01-08 – 2019-01-12 (×4): 1 via ORAL
  Filled 2019-01-07 (×5): qty 1

## 2019-01-07 MED ORDER — HYDROXYZINE HCL 50 MG/ML IM SOLN
50.0000 mg | Freq: Four times a day (QID) | INTRAMUSCULAR | Status: DC | PRN
  Administered 2019-01-07 – 2019-01-11 (×4): 50 mg via INTRAMUSCULAR
  Filled 2019-01-07 (×5): qty 1

## 2019-01-07 MED ORDER — HALOPERIDOL LACTATE 5 MG/ML IJ SOLN
1.0000 mg | Freq: Once | INTRAMUSCULAR | Status: AC
  Administered 2019-01-07: 1 mg via INTRAMUSCULAR
  Filled 2019-01-07: qty 1
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
## 2019-01-07 NOTE — ED Notes (Signed)
Since patient was still agitated and extremely anxious even after medication, I tried calling husband to see if he could calm the patient down. Held phone up to patient's ear to try to listen to her husband. She is still very agitated and anxious. Will continue to monitor.

## 2019-01-07 NOTE — ED Notes (Addendum)
Cleansed right arm skin tear with normal saline and patted dry. Covered with mepiplex border dressing.   She has already took dressing off and trying to get oob and screaming to stop. Asked for help from 2 NTs to place vaseline dressing covered with kerlix and placed mittens on patient.   Radiology is now trying to get CXR.
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
## 2019-01-08 LAB — SARS CORONAVIRUS 2 (TAT 6-24 HRS): SARS Coronavirus 2: NEGATIVE

## 2019-01-08 MED ORDER — LORAZEPAM 2 MG/ML IJ SOLN: 1.0000 mg | Freq: Once | INTRAMUSCULAR | Status: DC | PRN

## 2019-01-08 MED ORDER — HALOPERIDOL LACTATE 5 MG/ML IJ SOLN
1.0000 mg | Freq: Once | INTRAMUSCULAR | Status: AC | PRN
  Administered 2019-01-08: 1 mg via INTRAVENOUS
  Filled 2019-01-08: qty 1

## 2019-01-08 MED ORDER — LORAZEPAM 2 MG/ML IJ SOLN
1.0000 mg | Freq: Once | INTRAMUSCULAR | Status: AC | PRN
  Administered 2019-01-08: 1 mg via INTRAMUSCULAR
  Filled 2019-01-08: qty 1

## 2019-01-08 NOTE — NC FL2 (Addendum)
[place] MEDICAID FL2 LEVEL OF CARE SCREENING TOOL     IDENTIFICATION  Patient Name: Deanna Page Birthdate: April 21, 1929 Sex: female Admission Date (Current Location): 01/07/2019  [facility] and [place] Number:  Producer, television/film/video and Address:  [facility],  [address] [place] [address]      Provider Number: [id number]  Attending Physician Name and Address:  Default, Provider, MD  Relative Name and Phone Number:   Jamelah Sitzer [phone] and [phone]      Current Level of Care: Hospital Recommended Level of Care: Assisted Living Facility, Memory Care Prior Approval Number:    Date Approved/Denied:   PASRR Number:    Discharge Plan: Other (Comment)(ALF/memory care)    Current Diagnoses: Patient Active Problem List   Diagnosis Date Noted  . Fall at nursing home   . Chronic ischemic left PCA stroke 07/21/2018  . Closed fracture of proximal end of left humerus, initial encounter 07/21/2018  . Hypothyroidism 07/21/2018  . CKD (chronic kidney disease), stage III 07/21/2018  . Suicidal ideation   . Dementia (HCC) 12/30/2016  . Parkinson's disease (HCC) 03/01/2013  . Essential hypertension 06/02/2006    Orientation RESPIRATION BLADDER Height & Weight     Self, Place, Situation(flucuates due to dementia)  Normal Continent Weight:   Height:     BEHAVIORAL SYMPTOMS/MOOD NEUROLOGICAL BOWEL NUTRITION STATUS      Continent Diet(regular)  AMBULATORY STATUS COMMUNICATION OF NEEDS Skin   Limited Assist Verbally Normal                       Personal Care Assistance Level of Assistance  Bathing, Feeding, Dressing Bathing Assistance: Limited assistance Feeding assistance: Limited assistance Dressing Assistance: Limited assistance     Functional Limitations Info  Sight, Hearing, Speech Sight Info: Adequate Hearing Info: Adequate Speech Info: Adequate    SPECIAL CARE FACTORS FREQUENCY                       Contractures  Contractures Info: Not present    Additional Factors Info  Code Status Code Status Info: Full             Current Medications (01/08/2019):  This is the current hospital active medication list Current Facility-Administered Medications  Medication Dose Route Frequency Provider Last Rate Last Dose  . aspirin chewable tablet 81 mg  81 mg Oral Daily Mancel Bale, MD   81 mg at 01/08/19 0906  . carbidopa-levodopa (SINEMET IR) 25-100 MG per tablet immediate release 1.5 tablet  1.5 tablet Oral TID WC Mancel Bale, MD   1.5 tablet at 01/08/19 1141  . Carbidopa-Levodopa ER (SINEMET CR) 25-100 MG tablet controlled release 1 tablet  1 tablet Oral QAC breakfast Mancel Bale, MD   1 tablet at 01/08/19 0754  . citalopram (CELEXA) tablet 20 mg  20 mg Oral QHS Mancel Bale, MD      . clonazePAM Scarlette Calico) tablet 0.5-1 mg  0.5-1 mg Oral See admin instructions Mancel Bale, MD   0.5 mg at 01/08/19 1214  . hydrOXYzine (VISTARIL) injection 50 mg  50 mg Intramuscular Q6H PRN Mancel Bale, MD   50 mg at 01/08/19 1048  . levothyroxine (SYNTHROID) tablet 50 mcg  50 mcg Oral Q0600 Mancel Bale, MD   50 mcg at 01/08/19 0753  . LORazepam (ATIVAN) injection 1 mg  1 mg Intramuscular Once PRN Terald Sleeper, MD      . [facility]) tablet 10 mg  10 mg Oral BID Daleen Bo, MD   10 mg at 01/08/19 0906  . pantoprazole (PROTONIX) EC tablet 40 mg  40 mg Oral Daily Daleen Bo, MD   40 mg at 01/08/19 0906  . trimethoprim (TRIMPEX) tablet 100 mg  100 mg Oral Daily Daleen Bo, MD   100 mg at 01/08/19 2694   Current Outpatient Medications  Medication Sig Dispense Refill  . acetaminophen (TYLENOL) 325 MG tablet Take 650 mg by mouth 3 (three) times daily as needed for moderate pain.     Marland Kitchen alum & mag hydroxide-simeth (MAALOX/MYLANTA) 200-200-20 MG/5ML suspension Take 15 mLs by mouth 2 (two) times daily as needed for indigestion or heartburn.    Marland Kitchen antiseptic oral rinse (BIOTENE) LIQD 15 mLs by  Mouth Rinse route 2 (two) times daily as needed for dry mouth.    Marland Kitchen aspirin 81 MG chewable tablet Chew 81 mg by mouth daily.    . carbidopa-levodopa (SINEMET IR) 25-100 MG tablet Take 1.5 tablets by mouth 3 (three) times daily.     . Carbidopa-Levodopa ER (SINEMET CR) 25-100 MG tablet controlled release Take 1 tablet by mouth daily.     . citalopram (CELEXA) 20 MG tablet Take 1 tablet (20 mg total) by mouth at bedtime. (Patient taking differently: Take 20 mg by mouth daily. ) 30 tablet 5  . clonazePAM (KLONOPIN) 0.5 MG tablet Take 1 tablet (0.5 mg total) by mouth 3 (three) times daily. (Patient taking differently: Take 0.5-1 mg by mouth See admin instructions. 0.5 MG in AM and 1 MG in evening) 90 tablet 0  . levothyroxine (SYNTHROID, LEVOTHROID) 50 MCG tablet Take 50 mcg by mouth daily before breakfast.    . memantine (NAMENDA) 10 MG tablet Take 10 mg by mouth 2 (two) times daily.     . pantoprazole (PROTONIX) 20 MG tablet Take 2 tablets (40 mg total) by mouth daily. (Patient taking differently: Take 20 mg by mouth daily. ) 60 tablet 0  . polyvinyl alcohol (LIQUIFILM TEARS) 1.4 % ophthalmic solution Place 1 drop into both eyes 4 (four) times daily as needed for dry eyes.     . pravastatin (PRAVACHOL) 20 MG tablet Take 1 tablet (20 mg total) by mouth daily at 6 PM. 90 tablet 0  . sennosides-docusate sodium (SENOKOT-S) 8.6-50 MG tablet Take 1 tablet by mouth daily.    Marland Kitchen trimethoprim (TRIMPEX) 100 MG tablet Take 100 mg by mouth daily.    . Vitamin D, Cholecalciferol, 1000 units CAPS Take 1 Units by mouth daily.     Marland Kitchen aspirin EC 325 MG EC tablet Take 1 tablet (325 mg total) by mouth daily. (Patient not taking: Reported on 01/07/2019) 30 tablet 3     Discharge Medications: Please see discharge summary for a list of discharge medications.  Relevant Imaging Results:  Relevant Lab Results:   Additional Information SS#: [id number]  Janace Hoard, LCSW

## 2019-01-08 NOTE — BH Assessment (Signed)
[facility] Assessment Progress Note  Per Letitia Libra, FNP, this pt does not require psychiatric hospitalization at this time.  Pt is psychiatrically cleared.  No behavioral health referrals are indicated for this pt.  Otila Kluver reports that she will be ordering a social work consult for this pt, and will contact [place] [place].  Pt's nurse has been notified.  Jalene Mullet, [place] Triage Specialist [phone]

## 2019-01-08 NOTE — BH Assessment (Addendum)
Tele Assessment Note   Patient Name: Deanna Page MRN: [id number] Referring Physician: Lorre NickAnthony, Allen, MD [facility] Location of Provider: Behavioral Health TTS Department  Deanna Sitesvelyn W [place] is an 85 y.o. female. Per EDP note pt, "Patient is here for evaluation of combative and aggressive behavior.  She was transferred by EMS.  She lives with her husband.  Husband told EMS he has been hiding himself from her by walking himself into his bedroom.  During transport patient was treated with Haldol 5 mg IM because of agitated aggressive and combative behavior.  She also required four-point restraints.  Husband form EMS he wanted her placed into a assisted living or skilled nursing facility."  TTS attempted to conduct assessment via telecart and could not due to poor connetion, assessment done by phone. Patient was very disoriented during assessment and responses were slow and short. TTS asked pt if she was sucidial or had SI thoughts, pt expressed, " No I wouldn't do it". Pt also denied any HI or AVH. Pt was asked what brought her in, pt stated, " My husband, nothing, Im doing fine". Pt was asked if she exhbited symtpoms of depression and she stated yes but could not specify why. Pt was asked several other questions but could not engage or answer more questions, pt became silent on phone, non engaged and was disoriented. Pt was asked about collateral information and gave permission for TTS to call her husband.  TTS spoke with pt husband Darlina SicilianRobert Vi. Pts husband expressed that she has been extremely combative and aggressive towards him for the last 2 weeks. He reported that pt has been non responsive to taking care of her own personal needs such as not eating, not taking medications and not dressing herself and has refused help from the CNA aid as well. He also reported that she has a history of SI thoughts but never any attempts over the last several years. He feels she is not safe to  come home and expressed he can not care for her any longer and is trying to place her in another home, nursing facility or place very soon.  Diagnosis:     Depression   Dementia   Past Medical History:  Past Medical History:  Diagnosis Date  . Allergic rhinitis   . Anxiety   . Asthma   . Bowel obstruction (HCC)   . CKD (chronic kidney disease), stage III   . Dementia (HCC)   . Diverticulosis   . Esophageal dysmotility   . Esophageal stricture 2005  . GERD (gastroesophageal reflux disease)   . Hepatitis A   . Hiatal hernia   . Hypertension   . Hypothyroidism   . Internal hemorrhoids   . Irritable bowel syndrome   . Ischemic colitis, enteritis, or enterocolitis (HCC) 2010  . Major depression    with paranoia and insomnia  . OSA (obstructive sleep apnea)   . Parkinson disease (HCC)   . Pneumonia   . TIA (transient ischemic attack)   . Urinary incontinence   . Vitamin D deficiency     Past Surgical History:  Procedure Laterality Date  . ABDOMINAL HYSTERECTOMY  1969  . APPENDECTOMY  1969  . BLADDER SURGERY    . BREAST LUMPECTOMY  1995  . CATARACT EXTRACTION    . CHOLECYSTECTOMY  2000  . CORNEAL TRANSPLANT Left   . INTRAOCULAR PROSTHESES INSERTION     [facility]  . ORIF HUMERUS FRACTURE Left 07/23/2018  Procedure: OPEN REDUCTION INTERNAL FIXATION (ORIF) PROXIMAL HUMERUS FRACTURE;  Surgeon: Altamese River Bottom, MD;  Location: [facility];  Service: Orthopedics;  Laterality: Left;  . TUBAL LIGATION  1968    Family History:  Family History  Problem Relation Age of Onset  . Stroke Mother   . Parkinson's disease Mother   . Lung cancer Father   . ALS Sister   . Liver disease Sister   . Parkinson's disease Maternal Grandmother   . Parkinson's disease Maternal Aunt   . Emphysema Sister   . Emphysema Brother   . Colon cancer Neg Hx   . Colon polyps Neg Hx   . Diabetes Neg Hx   . Gallbladder disease Neg Hx   . Esophageal cancer Neg Hx     Social History:  reports  that she has never smoked. She has never used smokeless tobacco. She reports that she does not drink alcohol or use drugs.  Additional Social History:     CIWA: CIWA-Ar BP: 119/81 Pulse Rate: 93 COWS:    Allergies:  Allergies  Allergen Reactions  . Erythromycin     Other reaction(s): Other (See Comments) Other Reaction: GI Upset Unknown reaction  . Penicillins Rash and Shortness Of Breath    Unknown reaction Has patient had a PCN reaction causing immediate rash, facial/tongue/throat swelling, SOB or lightheadedness with hypotension: Unknown Has patient had a PCN reaction causing severe rash involving mucus membranes or skin necrosis: Unknown Has patient had a PCN reaction that required hospitalization: Unknown Has patient had a PCN reaction occurring within the last 10 years: Unknown If all of the above answers are "NO", then may proceed with Cephalosporin use.   [name] [Donepezil Hcl]     Unknown reaction  . Clindamycin/Lincomycin Itching  . Doxycycline     Unknown reaction  . Pneumococcal Vaccines     Unknown reaction    Home Medications: (Not in a hospital admission)   OB/GYN Status:  No LMP recorded. Patient has had a hysterectomy.  General Assessment Data Location of Assessment: [facility] TTS Assessment: In system Is this a Tele or Face-to-Face Assessment?: Tele Assessment Is this an Initial Assessment or a Re-assessment for this encounter?: Initial Assessment Patient Accompanied by:: N/A Living Arrangements: Other (Comment) Can pt return to current living arrangement?: Yes Admission Status: Voluntary Is patient capable of signing voluntary admission?: Yes Referral Source: Self/Family/Friend     Crisis Care Plan Living Arrangements: Other (Comment)     Risk to self with the past 6 months Suicidal Ideation: No Has patient been a risk to self within the past 6 months prior to admission? : (UTA) Suicidal Intent: No Has patient had any suicidal intent  within the past 6 months prior to admission? : No Is patient at risk for suicide?: Yes Suicidal Plan?: No Has patient had any suicidal plan within the past 6 months prior to admission? : (UTA) Access to Means: (UTA) What has been your use of drugs/alcohol within the last 12 months?: (none) Previous Attempts/Gestures: (UTA) How many times?: (UTA) Other Self Harm Risks: (UTA) Recent stressful life event(s): (unknown) Persecutory voices/beliefs?: (Unknown)  Risk to Others within the past 6 months History of harm to others?: (unknown) Does patient have access to weapons?: (unknown) Criminal Charges Pending?: (UTA) Does patient have a court date: (UTA) Is patient on probation?: (UTA)  Psychosis Hallucinations: (UTA) Delusions: (UTA)  Mental Status Report Appearance/Hygiene: Unable to Assess Eye Contact: Unable to Assess Motor Activity: Unable to assess Speech: Slow Level  of Consciousness: Unable to assess Mood: (UTA) Affect: (UTA) Anxiety Level: (UTA) Thought Processes: Unable to Assess Judgement: Unable to Assess Obsessive Compulsive Thoughts/Behaviors: Unable to Assess  Cognitive Functioning Concentration: Poor Memory: Unable to Assess Is patient IDD: No Insight: Unable to Assess Impulse Control: Unable to Assess Appetite: Good Have you had any weight changes? : No Change Sleep: No Change Vegetative Symptoms: None     Prior Inpatient Therapy Prior Inpatient Therapy: (UTA)  Prior Outpatient Therapy Prior Outpatient Therapy: Yes Prior Therapy Dates: (UTA) Prior Therapy Facilty/Provider(s): (UTA) Reason for Treatment: (UTA) Does patient have an ACCT team?: No Does patient have Intensive In-House Services?  : No Does patient have [facility] services? : No Does patient have P4CC services?: No                Advance Directives (For Healthcare) Does Patient Have a Medical Advance Directive?: No Would patient like information on creating a medical advance  directive?: No - Patient declined          Disposition: Adaku, Anike, NP recommends patient for overnight observation an to be re assessed by psychiatry in the morning. TTS confirmed status with current provider.  Disposition Initial Assessment Completed for this Encounter: Yes  This service was provided via telemedicine using a 2-way, interactive audio and video technology.  Names of all persons participating in this telemedicine service and their role in this encounter. Name: Jessice Madill [facility] Role: Patient  Name: Lacey Jensen, [place] Role: TTS Counselor  Name:  Role:   Name:  Role:     Natasha Mead 01/08/2019 3:26 AM

## 2019-01-08 NOTE — Progress Notes (Signed)
CSW called Deanna Page the admission person at [facility] at ph: [phone] who stated that their last COVID pt is expected to leave on Sunday 11/8 and as such they expect to have a bed available as soon as Monday 11/9.  CSW was asked to fax over pt's info (facesheet. MARS, FL-2) to fax: [phone] to seek a Memory Care bed for the pt.  CSW will continue to follow for D/C needs.  Deanna Page. Deanna Kardell, LCSW, LCAS, CSI Transitions of Care Clinical Social Worker Care Coordination Department Ph: [phone]

## 2019-01-08 NOTE — ED Notes (Addendum)
Pt. Documented in error CT Head w/o contrast. 

## 2019-01-08 NOTE — Progress Notes (Signed)
CSW received a call from [place] with [facility] who stated that [facility] is reviewing the pt's referral for a possible admission on Monday.  Webb Silversmith will also call Melburn Hake the admission person at [facility] at ph: [phone] to let them know that [facility] would be able to be the payor (rather than Medicaid) in the even that [facility] accepts the pt.  Per [name] at [facility] pt was denied by [facility], [facility] (SNF?), and [employer].  CSW will continue to follow for D/C needs.  Alphonse Guild. Eve Rey, LCSW, LCAS, CSI Transitions of Care Clinical Social Worker Care Coordination Department Ph: [phone]

## 2019-01-08 NOTE — BHH Suicide Risk Assessment (Cosign Needed)
Suicide Risk Assessment  Discharge Assessment   [facility] Discharge Suicide Risk Assessment   Principal Problem: Dementia [facility]) Discharge Diagnoses: Principal Problem:   Dementia ([place])   Total Time spent with patient: 30 minutes  Musculoskeletal: Strength & Muscle Tone: unable to assess Gait & Station: unable to assess Patient leans: unable to assess  Psychiatric Specialty Exam:   Blood pressure (!) 164/75, pulse 97, temperature 98.5 F (36.9 C), temperature source Oral, resp. rate 20, SpO2 96 %.There is no height or weight on file to calculate BMI.  General Appearance: Fairly Groomed  Engineer, water::  Fair  Speech:  Clear and Coherent and Normal Rate409  Volume:  Normal  Mood:  Anxious  Affect:  Congruent  Thought Process:  Disorganized and Descriptions of Associations: Loose  Orientation:  Other:  oriented to self only  Thought Content:  WDL  Suicidal Thoughts:  No  Homicidal Thoughts:  No  Memory:  Immediate;   Poor Recent;   Poor  Judgement:  Impaired  Insight:  Lacking  Psychomotor Activity:  Restlessness  Concentration:  Poor  Recall:  Poor  Fund of Knowledge:Fair  Language: Fair  Akathisia:  No  Handed:  Right  AIMS (if indicated):     Assets:  Financial Resources/Insurance Housing Social Support  Sleep:     Cognition: Impaired,  Moderate  ADL's:  Impaired   Mental Status Per Nursing Assessment::   On Admission:   Patient unable to fully participate in assessment, oriented to self only. Patient denies suicidal and homicidal thoughts but appears confused and restless during assessment.  Patient's husband, Himani Corona, provides collateral information: States "I want to do everything that I can to help my wife." Patient recently resided at [facility]  For 10 months because "I was not capable of caring for her, the family decided to bring her home and try one more time but we failed." Per husband patient will transfer to [facility] facility when  discharged. [facility], per husband [facility] is aware of family's wishes and will make final determination.   Demographic Factors:  Age 51 or older and Caucasian  Loss Factors: NA  Historical Factors: NA  Risk Reduction Factors:   Positive social support, Positive therapeutic relationship and Positive coping skills or problem solving skills  Continued Clinical Symptoms:  Medical Diagnoses and Treatments/Surgeries  Cognitive Features That Contribute To Risk:  Loss of executive function    Suicide Risk:  Minimal: No identifiable suicidal ideation.  Patients presenting with no risk factors but with morbid ruminations; may be classified as minimal risk based on the severity of the depressive symptoms    Plan Of Care/Follow-up recommendations:  Other:  Patient cleared by psychiatry.   Emmaline Kluver, FNP 01/08/2019, 11:02 AM

## 2019-01-08 NOTE — ED Notes (Signed)
Patient expressing agitation. She becomes combative when touched by staff. Will attempt vitals at another time.

## 2019-01-08 NOTE — TOC Initial Note (Signed)
Transition of Care [facility]) - Initial/Assessment Note    Patient Details  Name: Deanna Page MRN: [id number] Date of Birth: May 04, 1929  Transition of Care [facility]) CM/SW Contact:    Montine Circle, LCSW Phone Number: 01/08/2019, 12:26 PM  Clinical Narrative:                 CSW spoke with patient's husband, Molly Maduro, who was visiting in patient's room. Molly Maduro reports he is not able to care for patient. He reports he recently had back surgery and is not able to assist patient in her ADLs or with cooking. Molly Maduro reports patient was at [facility] for 10 months and was d/c in September. He reports he promised patient he would bring her home to care for her. Molly Maduro reports he has been in contact with [facility] to get patient back into a rehab facility, but there has been no success. CSW spoke with Dewayne Hatch with [facility] who reports 2 of 3 of the places they are contracted with declined to take patient back and she is waiting to her back from the 3rd and will follow up with CSW on this. Molly Maduro reports taking patient back home is "non-negotiable". Per RN, patient's husband reported to staff that patient held her husband in their home for 2 days because patient thought he was cheating on her.   Due to patient's husband being unable to care for patient, patient may benefit from ALF/memory care placement. CSW to assist with this in the meantime while waiting to hear back from [facility].   After patient's husband left visiting patient, CSW observed patient become very agitated and yelled that she wanted to go home. Patient also attempted to get up and walk to the door. Staff had to assist with calming patient down verbally.     Expected Discharge Plan: Memory Care Barriers to Discharge: No Barriers Identified   Patient Goals and CMS Choice Patient states their goals for this hospitalization and ongoing recovery are:: Per patient's husband "to get her in a nursing home."      Expected  Discharge Plan and Services Expected Discharge Plan: Memory Care     Post Acute Care Choice: Nursing Home(Memory care) Living arrangements for the past 2 months: Single Family Home                                      Prior Living Arrangements/Services Living arrangements for the past 2 months: Single Family Home Lives with:: Spouse Patient language and need for interpreter reviewed:: Yes Do you feel safe going back to the place where you live?: Yes      Need for Family Participation in Patient Care: Yes (Comment) Care giver support system in place?: Yes (comment)   Criminal Activity/Legal Involvement Pertinent to Current Situation/Hospitalization: Yes - Comment as needed  Activities of Daily Living      Permission Sought/Granted Permission sought to share information with : Family Supports, Oceanographer granted to share information with : Yes, Verbal Permission Granted  Share Information with NAME: Rashunda Passon  Permission granted to share info w AGENCY: ALF/memory care  Permission granted to share info w Relationship: husband  Permission granted to share info w Contact Information: [phone] and [phone]  Emotional Assessment Appearance:: Appears stated age Attitude/Demeanor/Rapport: Unable to Assess Affect (typically observed): Unable to Assess Orientation: : Fluctuating Orientation (Suspected and/or reported Sundowners)  Admission diagnosis:  agitated Patient Active Problem List   Diagnosis Date Noted  . Fall at nursing home   . Chronic ischemic left PCA stroke 07/21/2018  . Closed fracture of proximal end of left humerus, initial encounter 07/21/2018  . Hypothyroidism 07/21/2018  . CKD (chronic kidney disease), stage III 07/21/2018  . Suicidal ideation   . Dementia ([place]) 12/30/2016  . Parkinson's disease ([place]) 03/01/2013  . Essential hypertension 06/02/2006   PCP:  Janifer Adie, MD Pharmacy:  No  Pharmacies Listed    Social Determinants of Health (SDOH) Interventions    Readmission Risk Interventions Readmission Risk Prevention Plan 07/24/2018  Transportation Screening Complete  PCP or Specialist Appt within 3-5 Days Complete  HRI or [place] Not Complete  HRI or Home Care Consult comments Admission to SNF  Social Work Consult for [place] Planning/Counseling Complete  Palliative Care Screening Not Applicable  Some recent data might be hidden

## 2019-01-08 NOTE — ED Provider Notes (Signed)
Notified that pt is being aggressive, violent towards staff.  Haldol and ativan ordered earlier.  Will use restraints temporarily until medications take effect.   Dorie Rank, MD 01/08/19 1754

## 2019-01-09 LAB — URINE CULTURE: Culture: <10000 — AB

## 2019-01-09 NOTE — Progress Notes (Signed)
Received Deanna Page asleep in her bed with the sitter at the bedside.She continued to sleep throughout the night until early AM. She asked to use the bathroom in a very low voice. Her diaper was changed and a Purewick was put in place. She tolerated her synthroid and drink a cup of water. At the end she begin to cough. The head of her bed was maintained at 27 degrees afterwads. She drifted back off to sleep.

## 2019-01-09 NOTE — ED Notes (Signed)
Patient not combative or agitated since writer started shift at 0700. Bilateral ankle/wrist soft restraints removed at this time. Skin warm, dry, intact, and color appropriate. MD notified.

## 2019-01-09 NOTE — ED Notes (Signed)
Patient asleep entire shift. Woke patient up several times for medication, hydration, food. Patient refused each time.

## 2019-01-09 NOTE — Progress Notes (Signed)
SLP Cancellation Note  Patient Details Name: Deanna Page MRN: [id number] DOB: Mar 25, 1929   Cancelled treatment:       Reason Eval/Treat Not Completed: Patient's level of consciousness. Patient unable to be aroused and per notes and RN report, she has been  combative and agitated. RN stated that patient was observed to be coughing with liquids, which was a new occurrence during this admission. SLP will attempt to see patient next date.  Sonia Baller, MA, CCC-SLP 01/09/19 2:47 PM

## 2019-01-10 ENCOUNTER — Emergency Department (HOSPITAL_COMMUNITY): Payer: Medicare (Managed Care)

## 2019-01-10 LAB — URINALYSIS, ROUTINE W REFLEX MICROSCOPIC
Bacteria, UA: NONE SEEN
Bilirubin Urine: NEGATIVE
Glucose, UA: NEGATIVE mg/dL
Hgb urine dipstick: NEGATIVE
Ketones, ur: 5 mg/dL — AB
Leukocytes,Ua: NEGATIVE
Nitrite: NEGATIVE
Protein, ur: 100 mg/dL — AB
Specific Gravity, Urine: 1.021 (ref 1.005–1.030)
pH: 5 (ref 5.0–8.0)

## 2019-01-10 LAB — COMPREHENSIVE METABOLIC PANEL
ALT: 8 U/L (ref 0–44)
AST: 40 U/L (ref 15–41)
Albumin: 3.9 g/dL (ref 3.5–5.0)
Alkaline Phosphatase: 97 U/L (ref 38–126)
Anion gap: 15 (ref 5–15)
BUN: 28 mg/dL — ABNORMAL HIGH (ref 8–23)
CO2: 17 mmol/L — ABNORMAL LOW (ref 22–32)
Calcium: 9.7 mg/dL (ref 8.9–10.3)
Chloride: 107 mmol/L (ref 98–111)
Creatinine, Ser: 1.51 mg/dL — ABNORMAL HIGH (ref 0.44–1.00)
GFR calc Af Amer: 36 mL/min — ABNORMAL LOW (ref >60)
GFR calc non Af Amer: 31 mL/min — ABNORMAL LOW (ref >60)
Glucose, Bld: 126 mg/dL — ABNORMAL HIGH (ref 70–99)
Potassium: 4.5 mmol/L (ref 3.5–5.1)
Sodium: 139 mmol/L (ref 135–145)
Total Bilirubin: 0.9 mg/dL (ref 0.3–1.2)
Total Protein: 7.8 g/dL (ref 6.5–8.1)

## 2019-01-10 LAB — CBC WITH DIFFERENTIAL/PLATELET
Abs Immature Granulocytes: 0.08 10*3/uL — ABNORMAL HIGH (ref 0.00–0.07)
Basophils Absolute: 0.1 10*3/uL (ref 0.0–0.1)
Basophils Relative: 1 %
Eosinophils Absolute: 0 10*3/uL (ref 0.0–0.5)
Eosinophils Relative: 0 %
HCT: 34.6 % — ABNORMAL LOW (ref 36.0–46.0)
Hemoglobin: 11.2 g/dL — ABNORMAL LOW (ref 12.0–15.0)
Immature Granulocytes: 0 %
Lymphocytes Relative: 7 %
Lymphs Abs: 1.4 10*3/uL (ref 0.7–4.0)
MCH: 33.4 pg (ref 26.0–34.0)
MCHC: 32.4 g/dL (ref 30.0–36.0)
MCV: 103.3 fL — ABNORMAL HIGH (ref 80.0–100.0)
Monocytes Absolute: 2 10*3/uL — ABNORMAL HIGH (ref 0.1–1.0)
Monocytes Relative: 10 %
Neutro Abs: 16.5 10*3/uL — ABNORMAL HIGH (ref 1.7–7.7)
Neutrophils Relative %: 82 %
Platelets: 243 10*3/uL (ref 150–400)
RBC: 3.35 MIL/uL — ABNORMAL LOW (ref 3.87–5.11)
RDW: 13.4 % (ref 11.5–15.5)
WBC: 20.1 10*3/uL — ABNORMAL HIGH (ref 4.0–10.5)
nRBC: 0 % (ref 0.0–0.2)

## 2019-01-10 MED ORDER — ACETAMINOPHEN 650 MG RE SUPP
650.0000 mg | Freq: Once | RECTAL | Status: AC
  Administered 2019-01-10: 650 mg via RECTAL
  Filled 2019-01-10: qty 1

## 2019-01-10 MED ORDER — ACETAMINOPHEN 500 MG PO TABS: 1000.0000 mg | ORAL_TABLET | Freq: Once | ORAL | Status: DC

## 2019-01-10 NOTE — ED Provider Notes (Signed)
Pt's nurse notified me that pt has now developed a fever.  The pt has been here since 11/5.  She had a negative covid, neg CXR, and a urine cx which grew out less than 10,000 colonies.  She has not had a fever until today.  She has been very agitated and is unable to give any hx.  Repeat covid, labs, CXR ordered.  WBC more elevated, but repeat UA and CXR negative.  Covid pending and RVP pending.     Isla Pence, MD 01/10/19 2208

## 2019-01-10 NOTE — ED Notes (Signed)
Pt coughing while eating. Did not feed lunch, coughing.  Speech has been ordered(see orders). Speech paged.

## 2019-01-10 NOTE — ED Notes (Signed)
Patient restless, hitting at staff, and pulling off mittens.

## 2019-01-10 NOTE — ED Notes (Signed)
Pt confused,  restless, trying to to hit, redirected. Refused medication.

## 2019-01-10 NOTE — Progress Notes (Signed)
01/10/19 1600  SLP Visit Information  SLP Received On 01/10/19  Subjective  Subjective Pt was awake/alert, but confused with combative behavior   Patient/Family Stated Goal None stated and no family present   General Information  HPI Patient is here for evaluation of combative and aggressive behavior.  She was transferred by EMS.  She lives with her husband.  Husband told EMS he has been hiding himself from her by walking himself into his bedroom.  During transport patient was treated with Haldol 5 mg IM because of agitated aggressive and combative behavior.  She also required four-point restraints.  Husband form EMS he wanted her placed into a assisted living or skilled nursing facility.  Pt has hx of dysphagia with MBS completed on 07/22/18 with recommendations for Dysphagia 2 solids and thin liquid.    Type of Study Bedside Swallow Evaluation  Previous Swallow Assessment BSE and MBS in 2020   Diet Prior to this Study Regular;Thin liquids  Respiratory Status Room air  History of Recent Intubation No  Behavior/Cognition Confused;Agitated  Oral Cavity Assessment  (Unable to complete )  Self-Feeding Abilities Total assist  Patient Positioning Upright in bed  Baseline Vocal Quality Normal  Volitional Cough Strong  Volitional Swallow Unable to elicit  Pain Assessment  Pain Assessment Faces  Faces Pain Scale 2  Pain Location generalized   Pain Descriptors / Indicators Discomfort  Oral Assessment (Complete on admission/transfer/change in patient condition)  Does patient have any of the following "high(er) risk" factors? None of the above  Does patient have any of the following "at risk" factors? Other - dysphagia  Patient is AT RISK Order set for Adult Oral Care Protocol initiated -  "At Risk Patients" option selected (see row information)  Oral Motor/Sensory Function  Overall Oral Motor/Sensory Function  (Unable to evaluate)  Ice Chips  Ice chips NT  Thin Liquid  Thin Liquid WFL   Presentation Straw  Nectar Thick Liquid  Nectar Thick Liquid NT  Honey Thick Liquid  Honey Thick Liquid NT  Puree  Puree NT  Solid  Solid NT  SLP - End of Session  Patient left in bed;with nursing in room (with bilateral mitts in place )  Nurse Communication Aspiration precautions reviewed;Diet recommendation;Treatment plan  SLP Assessment  Clinical Impression Statement (ACUTE ONLY) Pt was encountered awake/alert in bed with bilateral mitts in place.  RN reported continual confusion and aggression and this was also observed during evaluation.   Unable to complete oral mech exam secondary to pt's inability to follow commands.  Pt was seen with minimal trials of thin liquid via straw sip.  Attempted numerous trials of thin liquid, puree, and regular solids in various flavors; however, pt declined all trials via turning her head away, verbally stating "no", swatting SLP's hand away, or covering mouth with hands.  No clinical s/sx of aspiration were observed with minimal trials of thin liquid.  Pt exhibited a cough x2 more than 10 minutes following minimal po trials, therefore do not suspect that it is related to po intake.  Recommend Dysphagia 1 (puree) solids and thin liquid with the following precautions: 1) Small bites/sips 2) Slow rate of intake 3) Sit upright 90 degrees.    SLP Visit Diagnosis Dysphagia, unspecified (R13.10)  Impact on safety and function Mild aspiration risk  Other Related Risk Factors History of dysphagia;Cognitive impairment  Swallow Evaluation Recommendations  SLP Diet Recommendations Dysphagia 1 (Puree);Thin liquid  Liquid Administration via Cup  Medication Administration Crushed with puree  Supervision Full supervision/cueing for compensatory strategies  Compensations Slow rate;Small sips/bites;Minimize environmental distractions  Postural Changes Seated upright at 90 degrees  Treatment Plan  Oral Care Recommendations Oral care BID;Staff/trained caregiver to  provide oral care  Treatment Recommendations Therapy as outlined in treatment plan below  Follow up Recommendations Skilled Nursing facility  Speech Therapy Frequency (ACUTE ONLY) min 1 x/week  Treatment Duration 1 week  Interventions Patient/family education;Trials of upgraded texture/liquids;Diet toleration management by SLP  Prognosis  Prognosis for Safe Diet Advancement Fair  Barriers to Reach Goals Cognitive deficits  Individuals Consulted  Consulted and Agree with Results and Recommendations Patient;RN  SLP Time Calculation  SLP Start Time (ACUTE ONLY) 1620  SLP Stop Time (ACUTE ONLY) 1638  SLP Time Calculation (min) (ACUTE ONLY) 18 min  SLP Evaluations  $ SLP Speech Visit 1 Visit  SLP Evaluations  $BSS Swallow 1 Procedure    Villa Herb M.S., CCC-SLP Acute Rehabilitation Services Office: [phone]

## 2019-01-11 LAB — SARS CORONAVIRUS 2 (TAT 6-24 HRS): SARS Coronavirus 2: NEGATIVE

## 2019-01-11 MED ORDER — HYDROXYZINE HCL 50 MG/ML IM SOLN: 50.0000 mg | Freq: Four times a day (QID) | INTRAMUSCULAR | Status: DC | PRN

## 2019-01-11 NOTE — ED Notes (Signed)
Changed rt wrist skin tear dressing to foam dressing, bilateral mittens applied

## 2019-01-11 NOTE — Progress Notes (Addendum)
12:58p CSW received call from [facility] admissions coordinator with [facility] who reports they are declining to accept patient into the ALF. CSW to follow up with other ALF/memory cares.  10:27a CSW received call from [place] with [facility] stating [facility] SNF is still reviewing patient and they are in need of patient's COVID test result to be faxed to them.   CSW called [facility] to obtain fax number. CSW spoke with [facility] admissions coordinator who also asked for an update on patient. CSW explained that patient is currently in mittens and if this will be a barrier for patient being admitted. Chantelle reports she will speak with her high ups about this. Chatelle reports that she is still waiting for the insurance process with [facility] to go through before they will be able to admit patient. CSW will continue to follow up.   Golden Circle, LCSW Transitions of Care Department [facility] ED [phone]

## 2019-01-11 NOTE — Progress Notes (Signed)
01/11/2019  1450  Notified Dr Johnney Killian of patients Resp 46, BP 145/76, HR 83, O2 94%  RA. Waiting for orders.

## 2019-01-11 NOTE — Progress Notes (Signed)
CSW spoke with Chantelle with [facility] who reports patient will have to have the mittens removed for atleast 24 hours before she can be admitted. CSW requested mittens be removed to RN. RN to remove mittens.   Golden Circle, LCSW Transitions of Care Department [facility] ED [phone]

## 2019-01-12 ENCOUNTER — Emergency Department (HOSPITAL_COMMUNITY): Payer: Medicare (Managed Care)

## 2019-01-12 LAB — URINE CULTURE: Culture: NO GROWTH

## 2019-01-12 LAB — BASIC METABOLIC PANEL
Anion gap: 12 (ref 5–15)
BUN: 40 mg/dL — ABNORMAL HIGH (ref 8–23)
CO2: 21 mmol/L — ABNORMAL LOW (ref 22–32)
Calcium: 9 mg/dL (ref 8.9–10.3)
Chloride: 104 mmol/L (ref 98–111)
Creatinine, Ser: 1.47 mg/dL — ABNORMAL HIGH (ref 0.44–1.00)
GFR calc Af Amer: 37 mL/min — ABNORMAL LOW (ref >60)
GFR calc non Af Amer: 32 mL/min — ABNORMAL LOW (ref >60)
Glucose, Bld: 134 mg/dL — ABNORMAL HIGH (ref 70–99)
Potassium: 3.4 mmol/L — ABNORMAL LOW (ref 3.5–5.1)
Sodium: 137 mmol/L (ref 135–145)

## 2019-01-12 LAB — CBC WITH DIFFERENTIAL/PLATELET
Abs Immature Granulocytes: 0.07 10*3/uL (ref 0.00–0.07)
Basophils Absolute: 0.1 10*3/uL (ref 0.0–0.1)
Basophils Relative: 1 %
Eosinophils Absolute: 0.4 10*3/uL (ref 0.0–0.5)
Eosinophils Relative: 3 %
HCT: 31.9 % — ABNORMAL LOW (ref 36.0–46.0)
Hemoglobin: 10.4 g/dL — ABNORMAL LOW (ref 12.0–15.0)
Immature Granulocytes: 1 %
Lymphocytes Relative: 8 %
Lymphs Abs: 1.1 10*3/uL (ref 0.7–4.0)
MCH: 33 pg (ref 26.0–34.0)
MCHC: 32.6 g/dL (ref 30.0–36.0)
MCV: 101.3 fL — ABNORMAL HIGH (ref 80.0–100.0)
Monocytes Absolute: 1.3 10*3/uL — ABNORMAL HIGH (ref 0.1–1.0)
Monocytes Relative: 10 %
Neutro Abs: 10.5 10*3/uL — ABNORMAL HIGH (ref 1.7–7.7)
Neutrophils Relative %: 77 %
Platelets: 227 10*3/uL (ref 150–400)
RBC: 3.15 MIL/uL — ABNORMAL LOW (ref 3.87–5.11)
RDW: 13.9 % (ref 11.5–15.5)
WBC: 13.5 10*3/uL — ABNORMAL HIGH (ref 4.0–10.5)
nRBC: 0 % (ref 0.0–0.2)

## 2019-01-12 LAB — LACTIC ACID, PLASMA: Lactic Acid, Venous: 1.3 mmol/L (ref 0.5–1.9)

## 2019-01-12 MED ORDER — ACETAMINOPHEN 325 MG PO TABS
650.0000 mg | ORAL_TABLET | Freq: Four times a day (QID) | ORAL | Status: DC | PRN
  Administered 2019-01-12: 650 mg via ORAL
  Filled 2019-01-12: qty 2

## 2019-01-12 NOTE — Progress Notes (Addendum)
Speech Language Pathology Treatment: Dysphagia  Patient Details Name: Deanna Page MRN: [id number] DOB: 1929/08/30 Today's Date: 01/12/2019 Time: 1040-1110 SLP Time Calculation (min) (ACUTE ONLY): 30 min  Assessment / Plan / Recommendation Clinical Impression  Pt today seen clinically to determine tolerance of po diet, indication for instrumental swallow evaluation.  RN reports pt is continuing to cough with intake.  SLP observed pt with intake of applesauce, thin liquid via straw, cup, tsp and nectar via straw.  Pt subjectively with either delay in oral transiting/oral holding - with delayed swallow.  She did tolerate tsps thin better than cup or straw boluses.  Pt did state she wanted "water" even when offered juice or soda.  No indication of aspiration with puree however pt does have h/o esophageal dysmotility.  Note pt's CXRs negative x2 with this admission.    SlP spoke to her spouse re: pt's dysphagia and inquired re: desire for pt to have a repeat MBS.  Reviewed last MBS findings. Reviewed risk factors for aspiration pneumonias including decreased mobility, weak cough, requiring on others to feed herself, etc.  When asked re: length of dysphagia, he advised since before 2018 and that she coughs "a lot" when she eats.  SLP advised him SLP can not assure pt is not aspirating.  Reviewed SLP role is aspiration mitigation not prevention.  Spouse asked if dysphagia and esophageal dysmotility can be "fixed" to which SLP answered no.  Spouse reports pt did not have benefit from prior swallow evaluations in xray. He then stated he did not want patient to have a repeat MBS and SLP advised he could request this as an OP if notices worsening of her symptoms, etc.  SlP advised nurse tech to try pt with gingerale as carbonated beverage may elicit a more efficient swallow  - and this was effective during her meal.  Observed her eating her meal -= with her coughing approximately 4 times.   Pt has not had  pneumonias per chart review thus has likely been tolerating some chronic aspiration.   SLP would recommend given pt's advanced age, progressive neurological disease and dysphagia which likely causes intermittent aspiration- review of code status/GOC may be beneficial for this pt.  Thanks for allowing me to help with this pt's care plan.       HPI HPI: Patient is here for evaluation of combative and aggressive behavior.  She was transferred by EMS.  She lives with her husband.  Husband told EMS he has been hiding himself from her by walking himself into his bedroom.  During transport patient was treated with Haldol 5 mg IM because of agitated aggressive and combative behavior.  She also required four-point restraints.  Husband form EMS he wanted her placed into a assisted living or skilled nursing facility.      SLP Plan  All goals met       Recommendations  Diet recommendations: Dysphagia 1 (puree);Thin liquid Liquids provided via: Teaspoon;Cup Medication Administration: Crushed with puree Supervision: Full supervision/cueing for compensatory strategies Compensations: Slow rate;Small sips/bites Postural Changes and/or Swallow Maneuvers: Seated upright 90 degrees;Upright 30-60 min after meal                Oral Care Recommendations: Oral care QID Follow up Recommendations: Other (comment) SLP Visit Diagnosis: Dysphagia, oropharyngeal phase (R13.12) Plan: All goals met       GO                Macario Golds 01/12/2019, 11:51 AM  Luanna Salk, [place] [facility] SLP Acute Rehab Services Pager [phone] Office [phone]

## 2019-01-12 NOTE — ED Notes (Signed)
Mittens were removed yesterday around 1500. Patient has not required mittens all shift. Patient has been cooperative and pleasant.,

## 2019-01-12 NOTE — ED Notes (Signed)
SLP at bedside. MBSS possible. SLP spoke with husband of patient. Patient has extensive history of coughing after eating/drinking.  Per Husband and SLP conversation, no MBSS will be done at this time.

## 2019-01-12 NOTE — ED Notes (Addendum)
Temp 100.3. Notified Dr. Roxanne Mins. New orders placed for labs, blood cultures and PO tylenol. Will continue to monitor.

## 2019-01-12 NOTE — ED Notes (Signed)
Report called to [place] at [facility]. Patient going to [address] hall.

## 2019-01-12 NOTE — ED Notes (Signed)
Spoke with De[facility] at [facility]. Patient will be transported at 1700 to [place].

## 2019-01-12 NOTE — Progress Notes (Addendum)
2:41p Patient has been accepted to [facility]. Patient will be transported via [facility]. RN is aware.  1:50p CSW spoke with Chantelle with [facility] and let her know that patient continues to be out of restraints and has not had any behaviors today. Patient will be good for transfer to [facility] at about 3pm today. CSW also spoke with [name] at [facility] who reports they will transport patient to [place]. CSW will continue to follow up.  Golden Circle, LCSW Transitions of Care Department [facility] ED [phone]

## 2019-01-12 NOTE — ED Provider Notes (Signed)
Patient had repeat CBC, BMP, lactic acid, blood cultures drawn for temperature 100.3.  Has been here for several days.  Has had some intermittent low temperatures.  Has had unremarkable imaging and Covid test x2 that have been negative.  Repeat labs show improvement of white count from 20-13.  Chest x-ray shows no signs of infection.  Lactic acid is normal.  Overall no concern for infectious process at this time.  Patient appears well.  She is eating.  Awaiting placement at this time.   Lennice Sites, DO 01/12/19 [phone]

## 2019-01-12 NOTE — ED Provider Notes (Signed)
Low-grade fever present, once again.  Temperature 100.3.  2 days ago, she was noted to have WBC elevated to 20.1, and creatinine had risen above baseline.  Will repeat CBC and metabolic panel and check lactic acid to look for signs of sepsis.  Case is signed out to Dr. Ronnald Nian to evaluate above-noted lab results.   Deanna Fuel, MD 85/02/77 [phone]

## 2019-01-17 LAB — CULTURE, BLOOD (ROUTINE X 2)
Culture: NO GROWTH
Culture: NO GROWTH

## 2019-02-05 ENCOUNTER — Inpatient Hospital Stay (HOSPITAL_COMMUNITY)
Admission: EM | Admit: 2019-02-05 | Discharge: 2019-02-08 | DRG: 871 | Disposition: A | Discharge status: Hospice | Payer: Medicare Other | Attending: Internal Medicine | Admitting: Internal Medicine

## 2019-02-05 ENCOUNTER — Emergency Department (HOSPITAL_COMMUNITY): Payer: Medicare Other

## 2019-02-05 DIAGNOSIS — Z66 Do not resuscitate: Secondary | ICD-10-CM | POA: Diagnosis present

## 2019-02-05 DIAGNOSIS — G20A1 Parkinson's disease without dyskinesia, without mention of fluctuations: Secondary | ICD-10-CM | POA: Diagnosis present

## 2019-02-05 DIAGNOSIS — Z887 Allergy status to serum and vaccine status: Secondary | ICD-10-CM

## 2019-02-05 DIAGNOSIS — I5032 Chronic diastolic (congestive) heart failure: Secondary | ICD-10-CM | POA: Diagnosis present

## 2019-02-05 DIAGNOSIS — Z515 Encounter for palliative care: Secondary | ICD-10-CM

## 2019-02-05 DIAGNOSIS — Z820 Family history of epilepsy and other diseases of the nervous system: Secondary | ICD-10-CM

## 2019-02-05 DIAGNOSIS — K219 Gastro-esophageal reflux disease without esophagitis: Secondary | ICD-10-CM | POA: Diagnosis present

## 2019-02-05 DIAGNOSIS — Z20828 Contact with and (suspected) exposure to other viral communicable diseases: Secondary | ICD-10-CM | POA: Diagnosis present

## 2019-02-05 DIAGNOSIS — R6521 Severe sepsis with septic shock: Secondary | ICD-10-CM | POA: Diagnosis present

## 2019-02-05 DIAGNOSIS — I130 Hypertensive heart and chronic kidney disease with heart failure and stage 1 through stage 4 chronic kidney disease, or unspecified chronic kidney disease: Secondary | ICD-10-CM | POA: Diagnosis present

## 2019-02-05 DIAGNOSIS — Z947 Corneal transplant status: Secondary | ICD-10-CM

## 2019-02-05 DIAGNOSIS — Z881 Allergy status to other antibiotic agents status: Secondary | ICD-10-CM

## 2019-02-05 DIAGNOSIS — J690 Pneumonitis due to inhalation of food and vomit: Secondary | ICD-10-CM | POA: Diagnosis present

## 2019-02-05 DIAGNOSIS — K224 Dyskinesia of esophagus: Secondary | ICD-10-CM | POA: Diagnosis present

## 2019-02-05 DIAGNOSIS — E039 Hypothyroidism, unspecified: Secondary | ICD-10-CM | POA: Diagnosis present

## 2019-02-05 DIAGNOSIS — F0390 Unspecified dementia without behavioral disturbance: Secondary | ICD-10-CM | POA: Diagnosis present

## 2019-02-05 DIAGNOSIS — N183 Chronic kidney disease, stage 3 unspecified: Secondary | ICD-10-CM | POA: Diagnosis present

## 2019-02-05 DIAGNOSIS — Z79899 Other long term (current) drug therapy: Secondary | ICD-10-CM

## 2019-02-05 DIAGNOSIS — D631 Anemia in chronic kidney disease: Secondary | ICD-10-CM | POA: Diagnosis present

## 2019-02-05 DIAGNOSIS — Z8719 Personal history of other diseases of the digestive system: Secondary | ICD-10-CM

## 2019-02-05 DIAGNOSIS — R6520 Severe sepsis without septic shock: Secondary | ICD-10-CM

## 2019-02-05 DIAGNOSIS — Z823 Family history of stroke: Secondary | ICD-10-CM

## 2019-02-05 DIAGNOSIS — N179 Acute kidney failure, unspecified: Secondary | ICD-10-CM | POA: Diagnosis present

## 2019-02-05 DIAGNOSIS — Z7189 Other specified counseling: Secondary | ICD-10-CM

## 2019-02-05 DIAGNOSIS — Z9049 Acquired absence of other specified parts of digestive tract: Secondary | ICD-10-CM

## 2019-02-05 DIAGNOSIS — Z801 Family history of malignant neoplasm of trachea, bronchus and lung: Secondary | ICD-10-CM

## 2019-02-05 DIAGNOSIS — Z888 Allergy status to other drugs, medicaments and biological substances status: Secondary | ICD-10-CM

## 2019-02-05 DIAGNOSIS — Z880 Allergy status to penicillin: Secondary | ICD-10-CM

## 2019-02-05 DIAGNOSIS — G4733 Obstructive sleep apnea (adult) (pediatric): Secondary | ICD-10-CM | POA: Diagnosis present

## 2019-02-05 DIAGNOSIS — G9341 Metabolic encephalopathy: Secondary | ICD-10-CM | POA: Diagnosis present

## 2019-02-05 DIAGNOSIS — I10 Essential (primary) hypertension: Secondary | ICD-10-CM | POA: Diagnosis not present

## 2019-02-05 DIAGNOSIS — F419 Anxiety disorder, unspecified: Secondary | ICD-10-CM | POA: Diagnosis present

## 2019-02-05 DIAGNOSIS — Z90710 Acquired absence of both cervix and uterus: Secondary | ICD-10-CM

## 2019-02-05 DIAGNOSIS — Z7982 Long term (current) use of aspirin: Secondary | ICD-10-CM

## 2019-02-05 DIAGNOSIS — E559 Vitamin D deficiency, unspecified: Secondary | ICD-10-CM | POA: Diagnosis present

## 2019-02-05 DIAGNOSIS — J9601 Acute respiratory failure with hypoxia: Secondary | ICD-10-CM | POA: Diagnosis present

## 2019-02-05 DIAGNOSIS — F0280 Dementia in other diseases classified elsewhere without behavioral disturbance: Secondary | ICD-10-CM | POA: Diagnosis present

## 2019-02-05 DIAGNOSIS — Z825 Family history of asthma and other chronic lower respiratory diseases: Secondary | ICD-10-CM

## 2019-02-05 DIAGNOSIS — A419 Sepsis, unspecified organism: Secondary | ICD-10-CM | POA: Diagnosis present

## 2019-02-05 DIAGNOSIS — R0600 Dyspnea, unspecified: Secondary | ICD-10-CM

## 2019-02-05 DIAGNOSIS — Z9851 Tubal ligation status: Secondary | ICD-10-CM

## 2019-02-05 DIAGNOSIS — Z8379 Family history of other diseases of the digestive system: Secondary | ICD-10-CM

## 2019-02-05 DIAGNOSIS — R0602 Shortness of breath: Secondary | ICD-10-CM | POA: Diagnosis present

## 2019-02-05 DIAGNOSIS — Z8673 Personal history of transient ischemic attack (TIA), and cerebral infarction without residual deficits: Secondary | ICD-10-CM

## 2019-02-05 DIAGNOSIS — G20 Parkinson's disease: Secondary | ICD-10-CM | POA: Diagnosis present

## 2019-02-05 DIAGNOSIS — Z79890 Hormone replacement therapy (postmenopausal): Secondary | ICD-10-CM

## 2019-02-05 DIAGNOSIS — D72829 Elevated white blood cell count, unspecified: Secondary | ICD-10-CM | POA: Diagnosis not present

## 2019-02-05 LAB — CBC
HCT: 25 % — ABNORMAL LOW (ref 36.0–46.0)
Hemoglobin: 7.2 g/dL — ABNORMAL LOW (ref 12.0–15.0)
MCH: 32.4 pg (ref 26.0–34.0)
MCHC: 28.8 g/dL — ABNORMAL LOW (ref 30.0–36.0)
MCV: 112.6 fL — ABNORMAL HIGH (ref 80.0–100.0)
Platelets: 291 10*3/uL (ref 150–400)
RBC: 2.22 MIL/uL — ABNORMAL LOW (ref 3.87–5.11)
RDW: 14.6 % (ref 11.5–15.5)
WBC: 16.9 10*3/uL — ABNORMAL HIGH (ref 4.0–10.5)
nRBC: 0 % (ref 0.0–0.2)

## 2019-02-05 LAB — PROTIME-INR
INR: 1.2 (ref 0.8–1.2)
Prothrombin Time: 15.2 seconds (ref 11.4–15.2)

## 2019-02-05 LAB — LACTIC ACID, PLASMA: Lactic Acid, Venous: 1.9 mmol/L (ref 0.5–1.9)

## 2019-02-05 LAB — CBC WITH DIFFERENTIAL/PLATELET
Abs Immature Granulocytes: 0.13 10*3/uL — ABNORMAL HIGH (ref 0.00–0.07)
Basophils Absolute: 0.1 10*3/uL (ref 0.0–0.1)
Basophils Relative: 1 %
Eosinophils Absolute: 0.3 10*3/uL (ref 0.0–0.5)
Eosinophils Relative: 2 %
HCT: 24.2 % — ABNORMAL LOW (ref 36.0–46.0)
Hemoglobin: 7.3 g/dL — ABNORMAL LOW (ref 12.0–15.0)
Immature Granulocytes: 1 %
Lymphocytes Relative: 7 %
Lymphs Abs: 1.2 10*3/uL (ref 0.7–4.0)
MCH: 32 pg (ref 26.0–34.0)
MCHC: 30.2 g/dL (ref 30.0–36.0)
MCV: 106.1 fL — ABNORMAL HIGH (ref 80.0–100.0)
Monocytes Absolute: 1.3 10*3/uL — ABNORMAL HIGH (ref 0.1–1.0)
Monocytes Relative: 7 %
Neutro Abs: 14.4 10*3/uL — ABNORMAL HIGH (ref 1.7–7.7)
Neutrophils Relative %: 82 %
Platelets: 356 10*3/uL (ref 150–400)
RBC: 2.28 MIL/uL — ABNORMAL LOW (ref 3.87–5.11)
RDW: 14 % (ref 11.5–15.5)
WBC: 17.3 10*3/uL — ABNORMAL HIGH (ref 4.0–10.5)
nRBC: 0 % (ref 0.0–0.2)

## 2019-02-05 LAB — COMPREHENSIVE METABOLIC PANEL
ALT: 7 U/L (ref 0–44)
AST: 56 U/L — ABNORMAL HIGH (ref 15–41)
Albumin: 2.3 g/dL — ABNORMAL LOW (ref 3.5–5.0)
Alkaline Phosphatase: 131 U/L — ABNORMAL HIGH (ref 38–126)
Anion gap: 13 (ref 5–15)
BUN: 30 mg/dL — ABNORMAL HIGH (ref 8–23)
CO2: 24 mmol/L (ref 22–32)
Calcium: 9 mg/dL (ref 8.9–10.3)
Chloride: 110 mmol/L (ref 98–111)
Creatinine, Ser: 1.52 mg/dL — ABNORMAL HIGH (ref 0.44–1.00)
GFR calc Af Amer: 35 mL/min — ABNORMAL LOW (ref >60)
GFR calc non Af Amer: 31 mL/min — ABNORMAL LOW (ref >60)
Glucose, Bld: 140 mg/dL — ABNORMAL HIGH (ref 70–99)
Potassium: 4.4 mmol/L (ref 3.5–5.1)
Sodium: 147 mmol/L — ABNORMAL HIGH (ref 135–145)
Total Bilirubin: 0.5 mg/dL (ref 0.3–1.2)
Total Protein: 6.7 g/dL (ref 6.5–8.1)

## 2019-02-05 LAB — CREATININE, SERUM
Creatinine, Ser: 1.15 mg/dL — ABNORMAL HIGH (ref 0.44–1.00)
GFR calc Af Amer: 50 mL/min — ABNORMAL LOW (ref >60)
GFR calc non Af Amer: 43 mL/min — ABNORMAL LOW (ref >60)

## 2019-02-05 LAB — ABO/RH: ABO/RH(D): B POS

## 2019-02-05 LAB — TSH: TSH: 0.405 u[IU]/mL (ref 0.350–4.500)

## 2019-02-05 LAB — SARS CORONAVIRUS 2 (TAT 6-24 HRS): SARS Coronavirus 2: NEGATIVE

## 2019-02-05 LAB — AMMONIA: Ammonia: 14 umol/L (ref 9–35)

## 2019-02-05 LAB — BRAIN NATRIURETIC PEPTIDE: B Natriuretic Peptide: 94 pg/mL (ref 0.0–100.0)

## 2019-02-05 LAB — PREPARE RBC (CROSSMATCH)

## 2019-02-05 LAB — POC OCCULT BLOOD, ED: Fecal Occult Bld: NEGATIVE

## 2019-02-05 LAB — POC SARS CORONAVIRUS 2 AG -  ED: SARS Coronavirus 2 Ag: NEGATIVE

## 2019-02-05 LAB — APTT: aPTT: 25 seconds (ref 24–36)

## 2019-02-05 MED ORDER — SODIUM CHLORIDE 0.9 % IV BOLUS
1000.0000 mL | Freq: Once | INTRAVENOUS | Status: AC
  Administered 2019-02-05: 1000 mL via INTRAVENOUS

## 2019-02-05 MED ORDER — DEXTROSE-NACL 5-0.9 % IV SOLN
INTRAVENOUS | Status: DC
  Administered 2019-02-05 – 2019-02-06 (×2): via INTRAVENOUS

## 2019-02-05 MED ORDER — SODIUM CHLORIDE 0.9 % IV SOLN
2.0000 g | Freq: Once | INTRAVENOUS | Status: AC
  Administered 2019-02-05: 2 g via INTRAVENOUS
  Filled 2019-02-05: qty 2

## 2019-02-05 MED ORDER — METRONIDAZOLE IN NACL 5-0.79 MG/ML-% IV SOLN
500.0000 mg | Freq: Once | INTRAVENOUS | Status: AC
  Administered 2019-02-05: 500 mg via INTRAVENOUS
  Filled 2019-02-05: qty 100

## 2019-02-05 MED ORDER — METRONIDAZOLE IN NACL 5-0.79 MG/ML-% IV SOLN
500.0000 mg | Freq: Three times a day (TID) | INTRAVENOUS | Status: DC
  Administered 2019-02-06 (×2): 500 mg via INTRAVENOUS
  Filled 2019-02-05 (×2): qty 100

## 2019-02-05 MED ORDER — HEPARIN SODIUM (PORCINE) 5000 UNIT/ML IJ SOLN
5000.0000 [IU] | Freq: Three times a day (TID) | INTRAMUSCULAR | Status: DC
  Administered 2019-02-05 – 2019-02-07 (×6): 5000 [IU] via SUBCUTANEOUS
  Filled 2019-02-05 (×6): qty 1

## 2019-02-05 MED ORDER — SODIUM CHLORIDE 0.9 % IV SOLN: 250.0000 mL | INTRAVENOUS | Status: DC

## 2019-02-05 MED ORDER — SODIUM CHLORIDE 0.9 % IV SOLN
10.0000 mL/h | Freq: Once | INTRAVENOUS | Status: AC
  Administered 2019-02-05: 10 mL/h via INTRAVENOUS

## 2019-02-05 MED ORDER — ONDANSETRON HCL 4 MG PO TABS: 4.0000 mg | ORAL_TABLET | Freq: Four times a day (QID) | ORAL | Status: DC | PRN

## 2019-02-05 MED ORDER — SODIUM CHLORIDE 0.9 % IV BOLUS (SEPSIS): 250.0000 mL | Freq: Once | INTRAVENOUS | Status: DC

## 2019-02-05 MED ORDER — VANCOMYCIN HCL IN DEXTROSE 1-5 GM/200ML-% IV SOLN
1000.0000 mg | Freq: Once | INTRAVENOUS | Status: AC
  Administered 2019-02-05: 1000 mg via INTRAVENOUS
  Filled 2019-02-05: qty 200

## 2019-02-05 MED ORDER — NOREPINEPHRINE 4 MG/250ML-% IV SOLN: 2.0000 ug/min | INTRAVENOUS | Status: DC

## 2019-02-05 MED ORDER — ONDANSETRON HCL 4 MG/2ML IJ SOLN: 4.0000 mg | Freq: Four times a day (QID) | INTRAMUSCULAR | Status: DC | PRN

## 2019-02-05 MED ORDER — SODIUM CHLORIDE 0.9 % IV BOLUS (SEPSIS)
1000.0000 mL | Freq: Once | INTRAVENOUS | Status: AC
  Administered 2019-02-05: 1000 mL via INTRAVENOUS

## 2019-02-05 MED ORDER — SODIUM CHLORIDE 0.9 % IV BOLUS (SEPSIS)
500.0000 mL | Freq: Once | INTRAVENOUS | Status: AC
  Administered 2019-02-05: 500 mL via INTRAVENOUS

## 2019-02-05 MED ORDER — VANCOMYCIN HCL IN DEXTROSE 750-5 MG/150ML-% IV SOLN: 750.0000 mg | INTRAVENOUS | Status: DC

## 2019-02-05 MED ORDER — SODIUM CHLORIDE 0.9 % IV SOLN
1.0000 g | Freq: Three times a day (TID) | INTRAVENOUS | Status: DC
  Administered 2019-02-06: 1 g via INTRAVENOUS
  Filled 2019-02-05 (×3): qty 1

## 2019-02-05 NOTE — ED Notes (Signed)
Responded appropriately with covid swab.  NRB dc'd and placed on [place] @ 5L.   sats remain at 99%

## 2019-02-05 NOTE — ED Provider Notes (Signed)
[facility] EMERGENCY DEPARTMENT Provider Note   CSN: [id number] Arrival date & time: 02/05/19  1524     History   Chief Complaint No chief complaint on file.   HPI Deanna Page is a 85 y.o. female.     Level 5 caveat for respiratory distress and unresponsive.  Patient here by EMS from her living facility.  She was apparently last seen normal "2 hours ago".  She was found to be hypoxic, hypotensive and tachypneic in respiratory distress.  She is placed on nonrebreather by EMS.  She is unable to give any history.  Patient has a history of dementia, Parkinson's disease, esophageal dysmotility, ischemic colitis, diastolic heart failure.  There is no documented fever.  She apparently tested negative for Covid several days ago.  She was found to be gurgling on food in her mouth on arrival to the ED.  She does have DNR paperwork in place.  The history is provided by the patient, a relative and the EMS personnel. The history is limited by the condition of the patient.    Past Medical History:  Diagnosis Date  . Allergic rhinitis   . Anxiety   . Asthma   . Bowel obstruction (HCC)   . CKD (chronic kidney disease), stage III   . Dementia (HCC)   . Diverticulosis   . Esophageal dysmotility   . Esophageal stricture 2005  . GERD (gastroesophageal reflux disease)   . Hepatitis A   . Hiatal hernia   . Hypertension   . Hypothyroidism   . Internal hemorrhoids   . Irritable bowel syndrome   . Ischemic colitis, enteritis, or enterocolitis (HCC) 2010  . Major depression    with paranoia and insomnia  . OSA (obstructive sleep apnea)   . Parkinson disease (HCC)   . Pneumonia   . TIA (transient ischemic attack)   . Urinary incontinence   . Vitamin D deficiency     Patient Active Problem List   Diagnosis Date Noted  . Fall at nursing home   . Chronic ischemic left PCA stroke 07/21/2018  . Closed fracture of proximal end of left humerus, initial encounter  07/21/2018  . Hypothyroidism 07/21/2018  . CKD (chronic kidney disease), stage III 07/21/2018  . Suicidal ideation   . Dementia (HCC) 12/30/2016  . Parkinson's disease (HCC) 03/01/2013  . Essential hypertension 06/02/2006    Past Surgical History:  Procedure Laterality Date  . ABDOMINAL HYSTERECTOMY  1969  . APPENDECTOMY  1969  . BLADDER SURGERY    . BREAST LUMPECTOMY  1995  . CATARACT EXTRACTION    . CHOLECYSTECTOMY  2000  . CORNEAL TRANSPLANT Left   . INTRAOCULAR PROSTHESES INSERTION     Left eye at [facility]  . ORIF HUMERUS FRACTURE Left 07/23/2018   Procedure: OPEN REDUCTION INTERNAL FIXATION (ORIF) PROXIMAL HUMERUS FRACTURE;  Surgeon: Myrene Galas, MD;  Location: [facility] OR;  Service: Orthopedics;  Laterality: Left;  . TUBAL LIGATION  1968     OB History   No obstetric history on file.      Home Medications    Prior to Admission medications   Medication Sig Start Date End Date Taking? Authorizing Provider  acetaminophen (TYLENOL) 325 MG tablet Take 650 mg by mouth 3 (three) times daily as needed for moderate pain.     [provider]  alum & mag hydroxide-simeth (MAALOX/MYLANTA) 200-200-20 MG/5ML suspension Take 15 mLs by mouth 2 (two) times daily as needed for indigestion or heartburn.  [provider]  antiseptic oral rinse (BIOTENE) LIQD 15 mLs by Mouth Rinse route 2 (two) times daily as needed for dry mouth.    [provider]  aspirin 81 MG chewable tablet Chew 81 mg by mouth daily.    [provider]  aspirin EC 325 MG EC tablet Take 1 tablet (325 mg total) by mouth daily. Patient not taking: Reported on 01/07/2019 07/26/18   Yvette RackAgyei, Obed K, MD  carbidopa-levodopa (SINEMET IR) 25-100 MG tablet Take 1.5 tablets by mouth 3 (three) times daily.     [provider]  Carbidopa-Levodopa ER (SINEMET CR) 25-100 MG tablet controlled release Take 1 tablet by mouth daily.     [provider]  citalopram (CELEXA) 20 MG tablet Take  1 tablet (20 mg total) by mouth at bedtime. Patient taking differently: Take 20 mg by mouth daily.  10/09/15   Huston FoleyAthar, Saima, MD  clonazePAM (KLONOPIN) 0.5 MG tablet Take 1 tablet (0.5 mg total) by mouth 3 (three) times daily. Patient taking differently: Take 0.5-1 mg by mouth See admin instructions. 0.5 MG in AM and 1 MG in evening 05/27/18   Sharee HolsterGreen, Deborah S, NP  levothyroxine (SYNTHROID, LEVOTHROID) 50 MCG tablet Take 50 mcg by mouth daily before breakfast.    [provider]  memantine (NAMENDA) 10 MG tablet Take 10 mg by mouth 2 (two) times daily.     [provider]  pantoprazole (PROTONIX) 20 MG tablet Take 2 tablets (40 mg total) by mouth daily. Patient taking differently: Take 20 mg by mouth daily.  02/12/18   Theotis BarrioLee, Joshua K, MD  polyvinyl alcohol (LIQUIFILM TEARS) 1.4 % ophthalmic solution Place 1 drop into both eyes 4 (four) times daily as needed for dry eyes.     [provider]  pravastatin (PRAVACHOL) 20 MG tablet Take 1 tablet (20 mg total) by mouth daily at 6 PM. 07/25/18   Agyei, Hermina Staggersbed K, MD  sennosides-docusate sodium (SENOKOT-S) 8.6-50 MG tablet Take 1 tablet by mouth daily.    [provider]  trimethoprim (TRIMPEX) 100 MG tablet Take 100 mg by mouth daily.    [provider]  Vitamin D, Cholecalciferol, 1000 units CAPS Take 1 Units by mouth daily.     [provider]    Family History Family History  Problem Relation Age of Onset  . Stroke Mother   . Parkinson's disease Mother   . Lung cancer Father   . ALS Sister   . Liver disease Sister   . Parkinson's disease Maternal Grandmother   . Parkinson's disease Maternal Aunt   . Emphysema Sister   . Emphysema Brother   . Colon cancer Neg Hx   . Colon polyps Neg Hx   . Diabetes Neg Hx   . Gallbladder disease Neg Hx   . Esophageal cancer Neg Hx     Social History Social History   Tobacco Use  . Smoking status: Never Smoker  . Smokeless tobacco: Never Used  Substance  Use Topics  . Alcohol use: No    Alcohol/week: 0.0 standard drinks  . Drug use: No     Allergies   Erythromycin, Penicillins, Aricept [donepezil hcl], Clindamycin/lincomycin, Doxycycline, and Pneumococcal vaccines   Review of Systems Review of Systems  Unable to perform ROS: Acuity of condition     Physical Exam Updated Vital Signs BP 126/73   Pulse 89   Temp 98.2 F (36.8 C) (Axillary)   Resp 19   Ht 5\' 3"  (1.6 m)  Wt 53 kg   SpO2 98%   BMI 20.70 kg/m   Physical Exam Constitutional:      General: She is in acute distress.     Appearance: She is ill-appearing and toxic-appearing.     Comments: Ill-appearing, toxic appearing  Minimally responsive  HENT:     Mouth/Throat:     Mouth: Mucous membranes are dry.     Comments: Fluid suctioned from mouth on arrival.  She has a Moist cough Gurgling on her secretions Neck:     Musculoskeletal: Normal range of motion.  Cardiovascular:     Rate and Rhythm: Tachycardia present.     Pulses: Normal pulses.  Pulmonary:     Effort: Respiratory distress present.     Breath sounds: Rhonchi and rales present.     Comments: Tachypneic, coarse rhonchi throughout Abdominal:     Palpations: Abdomen is soft.     Tenderness: There is no abdominal tenderness. There is no guarding.  Skin:    Capillary Refill: Capillary refill takes more than 3 seconds.  Neurological:     Comments: Does not speak, response to pain, moves all extremities      ED Treatments / Results  Labs (all labs ordered are listed, but only abnormal results are displayed) Labs Reviewed  COMPREHENSIVE METABOLIC PANEL - Abnormal; Notable for the following components:      Result Value   Sodium 147 (*)    Glucose, Bld 140 (*)    BUN 30 (*)    Creatinine, Ser 1.52 (*)    Albumin 2.3 (*)    AST 56 (*)    Alkaline Phosphatase 131 (*)    GFR calc non Af Amer 31 (*)    GFR calc Af Amer 35 (*)    All other components within normal limits  CBC WITH  DIFFERENTIAL/PLATELET - Abnormal; Notable for the following components:   WBC 17.3 (*)    RBC 2.28 (*)    Hemoglobin 7.3 (*)    HCT 24.2 (*)    MCV 106.1 (*)    Neutro Abs 14.4 (*)    Monocytes Absolute 1.3 (*)    Abs Immature Granulocytes 0.13 (*)    All other components within normal limits  CBC - Abnormal; Notable for the following components:   WBC 16.9 (*)    RBC 2.22 (*)    Hemoglobin 7.2 (*)    HCT 25.0 (*)    MCV 112.6 (*)    MCHC 28.8 (*)    All other components within normal limits  CREATININE, SERUM - Abnormal; Notable for the following components:   Creatinine, Ser 1.15 (*)    GFR calc non Af Amer 43 (*)    GFR calc Af Amer 50 (*)    All other components within normal limits  SARS CORONAVIRUS 2 (TAT 6-24 HRS)  CULTURE, BLOOD (ROUTINE X 2)  CULTURE, BLOOD (ROUTINE X 2)  URINE CULTURE  LACTIC ACID, PLASMA  APTT  PROTIME-INR  TSH  AMMONIA  BRAIN NATRIURETIC PEPTIDE  CBC WITH DIFFERENTIAL/PLATELET  URINALYSIS, ROUTINE W REFLEX MICROSCOPIC  PROTIME-INR  CORTISOL-AM, BLOOD  PROCALCITONIN  COMPREHENSIVE METABOLIC PANEL  CBC  POC SARS CORONAVIRUS 2 AG -  ED  POC OCCULT BLOOD, ED  TYPE AND SCREEN  PREPARE RBC (CROSSMATCH)  ABO/RH    EKG EKG Interpretation  Date/Time:  Friday February 05 2019 15:30:35 EST Ventricular Rate:  106 PR Interval:    QRS Duration: 84 QT Interval:  343 QTC Calculation: 456 R Axis:   -  19 Text Interpretation: Sinus tachycardia Consider right atrial enlargement Inferior infarct, old Anteroseptal infarct, old Nonspecific ST abnormality Confirmed by Glynn Octave [phone]) on 02/05/2019 3:37:19 PM   Radiology Dg Chest Port 1 View  Result Date: 02/05/2019 CLINICAL DATA:  85 year old female with sepsis. EXAM: PORTABLE CHEST 1 VIEW COMPARISON:  Chest radiograph dated 01/12/2019. FINDINGS: Diffuse hazy density throughout the left lung with overall slight decreased left lung volume. Findings concerning for an infectious process,  possibly atypical in etiology. Clinical correlation is recommended. There is no focal consolidation, pleural effusion, or pneumothorax. The cardiac silhouette is within normal limits. Calcification of the mitral annulus. Atherosclerotic calcification of the aorta. No acute osseous pathology. Left shoulder fixation sideplate and screws. IMPRESSION: Diffuse hazy density throughout the left lung concerning for an infectious process. Clinical correlation is recommended. Electronically Signed   By: Elgie Collard M.D.   On: 02/05/2019 16:47    Procedures .Critical Care Performed by: Glynn Octave, MD Authorized by: Glynn Octave, MD   Critical care provider statement:    Critical care time (minutes):  45   Critical care was time spent personally by me on the following activities:  Discussions with consultants, evaluation of patient's response to treatment, examination of patient, ordering and performing treatments and interventions, ordering and review of laboratory studies, ordering and review of radiographic studies, pulse oximetry, re-evaluation of patient's condition, obtaining history from patient or surrogate and review of old charts   (including critical care time)  Medications Ordered in ED Medications  sodium chloride 0.9 % bolus 1,000 mL (has no administration in time range)    And  sodium chloride 0.9 % bolus 500 mL (has no administration in time range)    And  sodium chloride 0.9 % bolus 250 mL (has no administration in time range)  aztreonam (AZACTAM) 2 g in sodium chloride 0.9 % 100 mL IVPB (has no administration in time range)  metroNIDAZOLE (FLAGYL) IVPB 500 mg (has no administration in time range)  vancomycin (VANCOCIN) IVPB 1000 mg/200 mL premix (has no administration in time range)     Initial Impression / Assessment and Plan / ED Course  I have reviewed the triage vital signs and the nursing notes.  Pertinent labs & imaging results that were available during my  care of the patient were reviewed by me and considered in my medical decision making (see chart for details).       Patient from nursing facility with respiratory distress, hypotension and hypoxia.  Strong suspicion for aspiration pneumonia.  She is hypoxic, hypotensive, tachypneic.  Code sepsis activated on arrival.  She is given IV fluids and aspiration antibiotics.  Discussed with husband Molly Maduro on arrival.  He is her power of attorney.  He states she does have an active DNR and does not want to be intubated or placed on life support.  He states she does not want CPR if her heart were to stop.  He is agreeable to IV fluids and IV antibiotics. Discussed with patient's husband and POA that this appears to be life-threatening illness and she very well may die from this.  Patient remains minimally responsive but her blood pressure is improving to 109 systolic.  Her x-ray shows infiltrate in the left side concerning for aspiration.  Found to have drop in hemoglobin to 7.2 from 10.  Hemoccult is checked but stool appears to be brown. FOBT negative.   Patient becoming more shallow in her respirations and more pale.  Concerned that she is actively  dying.  Husband updated at bedside.  He confirms DNR and DNI.  He agrees with IV fluids and IV antibiotics.  He is agreeable to more of a transition to comfort care but would like to contact some other family members first.  We will hold vasopressors at this time as her blood pressure is 112 systolic. Continue IVF and IV antibiotics for suspected aspiration pneumonia. pRBC transfusion ordered.  Admission dw Dr. Mikeal Hawthorne.  Deanna Page was evaluated in Emergency Department on 02/05/2019 for the symptoms described in the history of present illness. She was evaluated in the context of the global COVID-19 pandemic, which necessitated consideration that the patient might be at risk for infection with the SARS-CoV-2 virus that causes COVID-19. Institutional  protocols and algorithms that pertain to the evaluation of patients at risk for COVID-19 are in a state of rapid change based on information released by regulatory bodies including the CDC and federal and state organizations. These policies and algorithms were followed during the patient's care in the ED.  Final Clinical Impressions(s) / ED Diagnoses   Final diagnoses:  Sepsis with acute hypoxic respiratory failure without septic shock, due to unspecified organism [facility])  Aspiration pneumonia of left lung due to regurgitated food, unspecified part of lung [facility])    ED Discharge Orders    None       Glynn Octave, MD 02/06/19 [phone]

## 2019-02-05 NOTE — ED Notes (Signed)
Husband in with pt and MD for update.  Pt color pale and resp increasingly shallow.  Reviewed DNR status.  Will hold on pressors,but transfuse blood and continue with fluids and abx.   Pt did respond to husband and tell him she loved him in muffled tones.

## 2019-02-05 NOTE — H&P (Signed)
History and Physical   Deanna Page [id number] DOB: 1930/02/24 DOA: 02/05/2019  Referring MD/NP/PA: Dr. Wyvonnia Dusky   PCP: Janifer Adie, MD   Outpatient Specialists: None  Patient coming from: Home  Chief Complaint: Altered mental status and shortness of breath  HPI: Deanna Page is a 85 y.o. female with medical history significant of tightness on exam, asthma, chronic kidney disease stage III, dementia, GERD, hypertension, hypothyroidism multiple medical problems who was brought in from home due to sudden onset of shortness of breath about 2 hours prior to coming with significant hypoxemia and altered mental status.  Patient was at baseline of family at the time.  She is known to have history of esophageal dysmotility probably related to her 5 reflux disease.  Patient also has some diastolic heart failure.  Patient is not hypoxic hypotensive tachypneic and septic.  She appears to be in septic shock most likely due to aspiration pneumonitis.  Initial evidence showed residual food in the mouth close follow-up given credence to significant aspiration. Patient is a DNR.Marland Kitchen    ED Course: Temperature 97 for blood pressure 76/54 pulse 106 respiratory rate of 34 and oxygen sat 97% on nonrebreather bag.  White count 15.9 hemoglobin 7.2 and platelet count 291.  Creatinine is 1.15, sodium 147 potassium 4.4, glucose 140.  Lactic acid 1.9.  Head CT without contrast is negative.  Chest x-ray showed diffuse hazy density throughout the left lung concerning for pneumonia.  Review of Systems: As per HPI otherwise 10 point review of systems negative.    Past Medical History:  Diagnosis Date  . Allergic rhinitis   . Anxiety   . Asthma   . Bowel obstruction ([place])   . CKD (chronic kidney disease), stage III   . Dementia ([place])   . Diverticulosis   . Esophageal dysmotility   . Esophageal stricture 2005  . GERD (gastroesophageal reflux disease)   . Hepatitis A   . Hiatal hernia   .  Hypertension   . Hypothyroidism   . Internal hemorrhoids   . Irritable bowel syndrome   . Ischemic colitis, enteritis, or enterocolitis ([place]) 2010  . Major depression    with paranoia and insomnia  . OSA (obstructive sleep apnea)   . Parkinson disease ([place])   . Pneumonia   . TIA (transient ischemic attack)   . Urinary incontinence   . Vitamin D deficiency     Past Surgical History:  Procedure Laterality Date  . ABDOMINAL HYSTERECTOMY  1969  . APPENDECTOMY  1969  . BLADDER SURGERY    . BREAST LUMPECTOMY  1995  . CATARACT EXTRACTION    . CHOLECYSTECTOMY  2000  . CORNEAL TRANSPLANT Left   . INTRAOCULAR PROSTHESES INSERTION     Left eye at [facility]  . ORIF HUMERUS FRACTURE Left 07/23/2018   Procedure: OPEN REDUCTION INTERNAL FIXATION (ORIF) PROXIMAL HUMERUS FRACTURE;  Surgeon: Altamese [place], MD;  Location: [place];  Service: Orthopedics;  Laterality: Left;  . TUBAL LIGATION  1968     reports that she has never smoked. She has never used smokeless tobacco. She reports that she does not drink alcohol or use drugs.  Allergies  Allergen Reactions  . Erythromycin     Other reaction(s): Other (See Comments) Other Reaction: GI Upset Unknown reaction  . Penicillins Rash and Shortness Of Breath    Unknown reaction Has patient had a PCN reaction causing immediate rash, facial/tongue/throat swelling, SOB or lightheadedness with hypotension: Unknown Has patient had a PCN reaction  causing severe rash involving mucus membranes or skin necrosis: Unknown Has patient had a PCN reaction that required hospitalization: Unknown Has patient had a PCN reaction occurring within the last 10 years: Unknown If all of the above answers are "NO", then may proceed with Cephalosporin use.   Mart Piggs [Donepezil Hcl]     Unknown reaction  . Clindamycin/Lincomycin Itching  . Doxycycline     Unknown reaction  . Pneumococcal Vaccines     Unknown reaction    Family History  Problem Relation Age of Onset    . Stroke Mother   . Parkinson's disease Mother   . Lung cancer Father   . ALS Sister   . Liver disease Sister   . Parkinson's disease Maternal Grandmother   . Parkinson's disease Maternal Aunt   . Emphysema Sister   . Emphysema Brother   . Colon cancer Neg Hx   . Colon polyps Neg Hx   . Diabetes Neg Hx   . Gallbladder disease Neg Hx   . Esophageal cancer Neg Hx      Prior to Admission medications   Medication Sig Start Date End Date Taking? Authorizing Provider  acetaminophen (TYLENOL) 325 MG tablet Take 650 mg by mouth 3 (three) times daily as needed for moderate pain.     [provider]  alum & mag hydroxide-simeth (MAALOX/MYLANTA) 200-200-20 MG/5ML suspension Take 15 mLs by mouth 2 (two) times daily as needed for indigestion or heartburn.    [provider]  antiseptic oral rinse (BIOTENE) LIQD 15 mLs by Mouth Rinse route 2 (two) times daily as needed for dry mouth.    [provider]  aspirin 81 MG chewable tablet Chew 81 mg by mouth daily.    [provider]  aspirin EC 325 MG EC tablet Take 1 tablet (325 mg total) by mouth daily. Patient not taking: Reported on 01/07/2019 07/26/18   Yvette Rack, MD  carbidopa-levodopa (SINEMET IR) 25-100 MG tablet Take 1.5 tablets by mouth 3 (three) times daily.     [provider]  Carbidopa-Levodopa ER (SINEMET CR) 25-100 MG tablet controlled release Take 1 tablet by mouth daily.     [provider]  citalopram (CELEXA) 20 MG tablet Take 1 tablet (20 mg total) by mouth at bedtime. Patient taking differently: Take 20 mg by mouth daily.  10/09/15   Huston Foley, MD  clonazePAM (KLONOPIN) 0.5 MG tablet Take 1 tablet (0.5 mg total) by mouth 3 (three) times daily. Patient taking differently: Take 0.5-1 mg by mouth See admin instructions. 0.5 MG in AM and 1 MG in evening 05/27/18   Sharee Holster, NP  levothyroxine (SYNTHROID, LEVOTHROID) 50 MCG tablet Take 50 mcg by mouth daily before  breakfast.    [provider]  memantine (NAMENDA) 10 MG tablet Take 10 mg by mouth 2 (two) times daily.     [provider]  pantoprazole (PROTONIX) 20 MG tablet Take 2 tablets (40 mg total) by mouth daily. Patient taking differently: Take 20 mg by mouth daily.  02/12/18   Theotis Barrio, MD  polyvinyl alcohol (LIQUIFILM TEARS) 1.4 % ophthalmic solution Place 1 drop into both eyes 4 (four) times daily as needed for dry eyes.     [provider]  pravastatin (PRAVACHOL) 20 MG tablet Take 1 tablet (20 mg total) by mouth daily at 6 PM. 07/25/18   Agyei, Hermina Staggers, MD  sennosides-docusate sodium (SENOKOT-S) 8.6-50 MG tablet Take 1 tablet by mouth daily.  [provider]  trimethoprim (TRIMPEX) 100 MG tablet Take 100 mg by mouth daily.    [provider]  Vitamin D, Cholecalciferol, 1000 units CAPS Take 1 Units by mouth daily.     [provider]    Physical Exam: Vitals:   02/05/19 1600 02/05/19 1615 02/05/19 1630 02/05/19 1700  BP: (!) 87/57 (!) 87/52 (!) 81/57   Pulse: 88 87 88   Resp: (!) 21 20 (!) 25   SpO2: 99% 99% 97%   Weight:    53.1 kg  Height:    5\' 3"  (1.6 m)      Constitutional: Obtunded, responded to pain only Vitals:   02/05/19 1600 02/05/19 1615 02/05/19 1630 02/05/19 1700  BP: (!) 87/57 (!) 87/52 (!) 81/57   Pulse: 88 87 88   Resp: (!) 21 20 (!) 25   SpO2: 99% 99% 97%   Weight:    53.1 kg  Height:    5\' 3"  (1.6 m)   Eyes: PERRL, lids and conjunctivae normal, sunken eyes ENMT: Mucous membranes are dry. Posterior pharynx clear of any exudate or lesions.Normal dentition.  Neck: normal, supple, no masses, no thyromegaly Respiratory: Coarse breath sounds, decreased air entry, bilateral rhonchi and crackles, increased work of breathing  cardiovascular: Regular rate and rhythm, no murmurs / rubs / gallops. No extremity edema. 2+ pedal pulses. No carotid bruits.  Abdomen: no tenderness, no masses palpated. No  hepatosplenomegaly. Bowel sounds positive.  Musculoskeletal: no clubbing / cyanosis. No joint deformity upper and lower extremities. Good ROM, no contractures. Normal muscle tone.  Skin: no rashes, lesions, ulcers. No induration Neurologic: CN 2-12 grossly intact. Sensation intact, DTR normal. Strength 5/5 in all 4.  Psychiatric: Patient is obtunded, responds to pain only.    Labs on Admission: I have personally reviewed following labs and imaging studies  CBC: Recent Labs  Lab 02/05/19 1645  WBC 17.3*  NEUTROABS 14.4*  HGB 7.3*  HCT 24.2*  MCV 106.1*  PLT 356   Basic Metabolic Panel: Recent Labs  Lab 02/05/19 1529  NA 147*  K 4.4  CL 110  CO2 24  GLUCOSE 140*  BUN 30*  CREATININE 1.52*  CALCIUM 9.0   GFR: Estimated Creatinine Clearance: 21.6 mL/min (A) (by C-G formula based on SCr of 1.52 mg/dL (H)). Liver Function Tests: Recent Labs  Lab 02/05/19 1529  AST 56*  ALT 7  ALKPHOS 131*  BILITOT 0.5  PROT 6.7  ALBUMIN 2.3*   No results for input(s): LIPASE, AMYLASE in the last 168 hours. Recent Labs  Lab 02/05/19 1531  AMMONIA 14   Coagulation Profile: Recent Labs  Lab 02/05/19 1529  INR 1.2   Cardiac Enzymes: No results for input(s): CKTOTAL, CKMB, CKMBINDEX, TROPONINI in the last 168 hours. BNP (last 3 results) No results for input(s): PROBNP in the last 8760 hours. HbA1C: No results for input(s): HGBA1C in the last 72 hours. CBG: No results for input(s): GLUCAP in the last 168 hours. Lipid Profile: No results for input(s): CHOL, HDL, LDLCALC, TRIG, CHOLHDL, LDLDIRECT in the last 72 hours. Thyroid Function Tests: Recent Labs    02/05/19 1531  TSH 0.405   Anemia Panel: No results for input(s): VITAMINB12, FOLATE, FERRITIN, TIBC, IRON, RETICCTPCT in the last 72 hours. Urine analysis:    Component Value Date/Time   COLORURINE YELLOW 01/10/2019 1844   APPEARANCEUR CLEAR 01/10/2019 1844   LABSPEC 1.021 01/10/2019 1844   PHURINE 5.0  01/10/2019 1844   GLUCOSEU NEGATIVE 01/10/2019 1844  HGBUR NEGATIVE 01/10/2019 1844   BILIRUBINUR NEGATIVE 01/10/2019 1844   KETONESUR 5 (A) 01/10/2019 1844   PROTEINUR 100 (A) 01/10/2019 1844   UROBILINOGEN 0.2 08/02/2009 0032   NITRITE NEGATIVE 01/10/2019 1844   LEUKOCYTESUR NEGATIVE 01/10/2019 1844   Sepsis Labs: @LABRCNTIP (procalcitonin:4,lacticidven:4) )No results found for this or any previous visit (from the past 240 hour(s)).   Radiological Exams on Admission: Dg Chest Port 1 View  Result Date: 02/05/2019 CLINICAL DATA:  85 year old female with sepsis. EXAM: PORTABLE CHEST 1 VIEW COMPARISON:  Chest radiograph dated 01/12/2019. FINDINGS: Diffuse hazy density throughout the left lung with overall slight decreased left lung volume. Findings concerning for an infectious process, possibly atypical in etiology. Clinical correlation is recommended. There is no focal consolidation, pleural effusion, or pneumothorax. The cardiac silhouette is within normal limits. Calcification of the mitral annulus. Atherosclerotic calcification of the aorta. No acute osseous pathology. Left shoulder fixation sideplate and screws. IMPRESSION: Diffuse hazy density throughout the left lung concerning for an infectious process. Clinical correlation is recommended. Electronically Signed   By: 13/12/2018 M.D.   On: 02/05/2019 16:47    EKG: Independently reviewed.  Sinus tachycardia with a rate of 106, low voltage EKG.  No significant ST changes.  Assessment/Plan Principal Problem:   Septic shock (HCC) Active Problems:   Essential hypertension   Parkinson's disease (HCC)   Dementia (HCC)   Hypothyroidism   CKD (chronic kidney disease), stage III   Aspiration pneumonia (H[facility])     #1 septic shock: Presumed secondary to aspiration pneumonia.  Patient is currently obtunded and not communicating.  She is a DNR.  Will admit to progressive care.  IV fluids with IV antibiotics.  Patient placed on aztreonam  metronidazole and vancomycin for now.  #2 essential hypertension: Patient's blood pressure is low now.  Hold antihypertensives.  #3 aspiration pneumonia: As a result of advanced dysphagia.  Continue IV aztreonam with Flagyl due to patient's penicillin allergies.  Obtain cultures.  #4 Parkinson's disease: Patient will be n.p.o. for now.  Resume home regimen when able to take p.o.'s.  #5 advanced dementia: Currently obtunded.  No agitation  #6 chronic kidney disease stage III: Appears to be at baseline  #7 esophageal dysmotility with GERD: Probably as a result of her Parkinson's disease.  Continue monitoring   #8 anemia: No evidence of GI bleed.  Patient initiated on blood transfusion just 1 unit.  Will follow H&H afterwards.   DVT prophylaxis: Heparin Code Status: DNR Family Communication: Husband Disposition Plan: To be determined Consults called: None Admission status: Inpatient  Severity of Illness: The appropriate patient status for this patient is INPATIENT. Inpatient status is judged to be reasonable and necessary in order to provide the required intensity of service to ensure the patient's safety. The patient's presenting symptoms, physical exam findings, and initial radiographic and laboratory data in the context of their chronic comorbidities is felt to place them at high risk for further clinical deterioration. Furthermore, it is not anticipated that the patient will be medically stable for discharge from the hospital within 2 midnights of admission. The following factors support the patient status of inpatient.   " The patient's presenting symptoms include altered mental status. " The worrisome physical exam findings include completely obtunded. " The initial radiographic and laboratory data are worrisome because of evidence of sepsis with shock. " The chronic co-morbidities include dementia.   * I certify that at the point of admission it is my clinical judgment that the  patient  will require inpatient hospital care spanning beyond 2 midnights from the point of admission due to high intensity of service, high risk for further deterioration and high frequency of surveillance required.Lonia Blood MD [facility] Pager [phone]  If 7PM-7AM, please contact night-coverage [email or web address] Password [facility]  02/05/2019, 6:27 PM

## 2019-02-05 NOTE — ED Notes (Signed)
Remains unresponsive but tolerating [place] oxygen.  Last Bl Cultures drawn, additional liter of fluids ordered.  Husband here and discussing plan with MD.

## 2019-02-05 NOTE — ED Triage Notes (Signed)
Brought in by EMS unresponsive and unable to follow commands.  On NRB @15L  with coarse lung sounds.  Food noted in mouth and suction done with gag reflex.  Skin warm to touch.  DNR paperwork at bedside.  MD to call family as well.

## 2019-02-05 NOTE — Progress Notes (Signed)
Pharmacy Antibiotic Note  Deanna Page is a 85 y.o. female admitted on 02/05/2019 with sepsis.  Pharmacy has been consulted for vancomycin and aztreonam dosing. WBC is elevated at 17.3. SCr is elevated but appears to be at her baseline. Lactic acid is <2.   Plan: Vancomycin 1gm IV x 1 then 750mg  IV Q48H Aztreonam 2gm IV x 1 then 1gm IV Q8H F/u renal fxn, C&S, clinical status and peak/trough at [facility]    No data recorded.  Recent Labs  Lab 02/05/19 1529 02/05/19 1645  WBC  --  17.3*  CREATININE 1.52*  --   LATICACIDVEN 1.9  --     Estimated Creatinine Clearance: 21.6 mL/min (A) (by C-G formula based on SCr of 1.52 mg/dL (H)).    Allergies  Allergen Reactions  . Erythromycin     Other reaction(s): Other (See Comments) Other Reaction: GI Upset Unknown reaction  . Penicillins Rash and Shortness Of Breath    Unknown reaction Has patient had a PCN reaction causing immediate rash, facial/tongue/throat swelling, SOB or lightheadedness with hypotension: Unknown Has patient had a PCN reaction causing severe rash involving mucus membranes or skin necrosis: Unknown Has patient had a PCN reaction that required hospitalization: Unknown Has patient had a PCN reaction occurring within the last 10 years: Unknown If all of the above answers are "NO", then may proceed with Cephalosporin use.   Deanna Page [Donepezil Hcl]     Unknown reaction  . Clindamycin/Lincomycin Itching  . Doxycycline     Unknown reaction  . Pneumococcal Vaccines     Unknown reaction    Antimicrobials this admission: Vanc 12/4>> Aztreo 12/4>> Flagyl x 1 12/4  Dose adjustments this admission: N/A  Microbiology results: Pending  Thank you for allowing pharmacy to be a part of this patient's care.  Deanna Page, [name] 02/05/2019 3:42 PM

## 2019-02-06 DIAGNOSIS — N183 Chronic kidney disease, stage 3 unspecified: Secondary | ICD-10-CM

## 2019-02-06 DIAGNOSIS — J690 Pneumonitis due to inhalation of food and vomit: Secondary | ICD-10-CM

## 2019-02-06 DIAGNOSIS — N179 Acute kidney failure, unspecified: Secondary | ICD-10-CM

## 2019-02-06 DIAGNOSIS — G20 Parkinson's disease: Secondary | ICD-10-CM

## 2019-02-06 DIAGNOSIS — R6520 Severe sepsis without septic shock: Secondary | ICD-10-CM

## 2019-02-06 LAB — CBC
HCT: 31.9 % — ABNORMAL LOW (ref 36.0–46.0)
Hemoglobin: 10 g/dL — ABNORMAL LOW (ref 12.0–15.0)
MCH: 31.5 pg (ref 26.0–34.0)
MCHC: 31.3 g/dL (ref 30.0–36.0)
MCV: 100.6 fL — ABNORMAL HIGH (ref 80.0–100.0)
Platelets: 303 10*3/uL (ref 150–400)
RBC: 3.17 MIL/uL — ABNORMAL LOW (ref 3.87–5.11)
RDW: 15.9 % — ABNORMAL HIGH (ref 11.5–15.5)
WBC: 16.2 10*3/uL — ABNORMAL HIGH (ref 4.0–10.5)
nRBC: 0 % (ref 0.0–0.2)

## 2019-02-06 LAB — COMPREHENSIVE METABOLIC PANEL
ALT: 18 U/L (ref 0–44)
AST: 35 U/L (ref 15–41)
Albumin: 1.8 g/dL — ABNORMAL LOW (ref 3.5–5.0)
Alkaline Phosphatase: 107 U/L (ref 38–126)
Anion gap: 7 (ref 5–15)
BUN: 22 mg/dL (ref 8–23)
CO2: 21 mmol/L — ABNORMAL LOW (ref 22–32)
Calcium: 7.7 mg/dL — ABNORMAL LOW (ref 8.9–10.3)
Chloride: 117 mmol/L — ABNORMAL HIGH (ref 98–111)
Creatinine, Ser: 0.95 mg/dL (ref 0.44–1.00)
GFR calc Af Amer: >60 mL/min (ref >60)
GFR calc non Af Amer: 54 mL/min — ABNORMAL LOW (ref >60)
Glucose, Bld: 155 mg/dL — ABNORMAL HIGH (ref 70–99)
Potassium: 3.6 mmol/L (ref 3.5–5.1)
Sodium: 145 mmol/L (ref 135–145)
Total Bilirubin: 0.6 mg/dL (ref 0.3–1.2)
Total Protein: 5.5 g/dL — ABNORMAL LOW (ref 6.5–8.1)

## 2019-02-06 LAB — URINALYSIS, ROUTINE W REFLEX MICROSCOPIC
Bilirubin Urine: NEGATIVE
Glucose, UA: NEGATIVE mg/dL
Hgb urine dipstick: NEGATIVE
Ketones, ur: NEGATIVE mg/dL
Leukocytes,Ua: NEGATIVE
Nitrite: NEGATIVE
Protein, ur: NEGATIVE mg/dL
Specific Gravity, Urine: 1.019 (ref 1.005–1.030)
pH: 5 (ref 5.0–8.0)

## 2019-02-06 LAB — PROCALCITONIN: Procalcitonin: 0.22 ng/mL

## 2019-02-06 LAB — CORTISOL-AM, BLOOD: Cortisol - AM: 18.6 ug/dL (ref 6.7–22.6)

## 2019-02-06 LAB — PROTIME-INR
INR: 1.3 — ABNORMAL HIGH (ref 0.8–1.2)
Prothrombin Time: 16.1 seconds — ABNORMAL HIGH (ref 11.4–15.2)

## 2019-02-06 MED ORDER — VANCOMYCIN HCL 10 G IV SOLR
1250.0000 mg | INTRAVENOUS | Status: DC
  Filled 2019-02-06: qty 1250

## 2019-02-06 MED ORDER — CHLORHEXIDINE GLUCONATE 0.12 % MT SOLN
15.0000 mL | Freq: Two times a day (BID) | OROMUCOSAL | Status: DC
  Administered 2019-02-06 – 2019-02-08 (×4): 15 mL via OROMUCOSAL
  Filled 2019-02-06 (×4): qty 15

## 2019-02-06 MED ORDER — ACETAMINOPHEN 650 MG RE SUPP
650.0000 mg | RECTAL | Status: DC | PRN
  Administered 2019-02-06: 650 mg via RECTAL
  Filled 2019-02-06 (×2): qty 1

## 2019-02-06 MED ORDER — ORAL CARE MOUTH RINSE
15.0000 mL | Freq: Two times a day (BID) | OROMUCOSAL | Status: DC
  Administered 2019-02-07 – 2019-02-08 (×2): 15 mL via OROMUCOSAL

## 2019-02-06 MED ORDER — CHLORHEXIDINE GLUCONATE CLOTH 2 % EX PADS
6.0000 | MEDICATED_PAD | Freq: Every day | CUTANEOUS | Status: DC
  Administered 2019-02-07 (×2): 6 via TOPICAL

## 2019-02-06 MED ORDER — MORPHINE SULFATE (PF) 2 MG/ML IV SOLN
2.0000 mg | INTRAVENOUS | Status: DC | PRN
  Administered 2019-02-06 – 2019-02-07 (×4): 2 mg via INTRAVENOUS
  Filled 2019-02-06 (×5): qty 1

## 2019-02-06 MED ORDER — SODIUM CHLORIDE 0.9 % IV SOLN
1.0000 g | INTRAVENOUS | Status: DC
  Administered 2019-02-06 – 2019-02-07 (×2): 1 g via INTRAVENOUS
  Filled 2019-02-06 (×2): qty 1
  Filled 2019-02-06 (×2): qty 10

## 2019-02-06 NOTE — Progress Notes (Signed)
Patient ID: NIKOLETA DADY, female   DOB: Dec 10, 1929, 85 y.o.   MRN: [id number]  PROGRESS NOTE    Margarie Mcguirt [facility]  LFY:[id number] DOB: 12/23/1929 DOA: 02/05/2019 PCP: Jethro Bastos, MD   Brief Narrative:  85 year old female with history of Parkinson's disease, dementia, chronic kidney disease stage III, asthma, GERD, hypertension, hypothyroidism presented on 02/05/2019 with altered mental status and shortness of breath.  She was initially obtunded on presentation and required nonrebreather and was hypotensive with leukocytosis.  Chest x-ray showed diffuse hazy density throughout the left lung concerning for pneumonia.  CT of the head was negative for acute abnormality.  She was started on IV fluids and antibiotics.  Assessment & Plan:   Possible aspiration pneumonia Severe sepsis: Present on admission -Patient is currently on broad-spectrum antibiotics.  Follow cultures.  Blood pressure improving.  Currently n.p.o.  Will request SLP evaluation.  Decrease IV fluids to 100 cc an hour.  Acute metabolic encephalopathy History of Parkinson's disease and advanced dementia -Patient was apparently obtunded on presentation.  Currently more awake but does not answer much questions.  Confused.  Unclear if this is her baseline mental status. -Monitor mental status.  CT of the head was negative for acute abnormality on presentation.  Fall precautions. -We will resume regimen for Parkinson's disease once she starts tolerating p.o.  Acute kidney injury on chronic kidney disease stage III -Kidney function improving.  Monitor  Leukocytosis -Probably from sepsis and pneumonia.  Improving.  Monitor  Anemia of chronic disease -Presented with hemoglobin of 7.3 on admission.  She was transfused 1 unit of packed red cells on admission.  Hemoglobin 10 today.  Hypertension -Antihypertensives on hold because of hypotension on presentation.  Monitor.  Esophageal dysmotility with GERD  -Probably as  result of her Parkinson's disease.  Continue monitoring.  Generalized deconditioning -Overall prognosis is poor.  Will request medical care consultation.  Patient is a DNR.  DVT prophylaxis: Heparin Code Status: DNR Family Communication: Spoke to husband/Robert on phone on 02/06/2019 Disposition Plan: Depends on clinical outcome  Consultants: We will request palliative care consultation  Procedures: None  Antimicrobials: Azactam, vancomycin and Flagyl from 02/05/2019 onwards   Subjective: Patient seen and examined at bedside.  She is more awake this morning, answers her name but does not answer any other questions.  No overnight fever or vomiting reported.  Objective: Vitals:   02/06/19 0915 02/06/19 0930 02/06/19 0945 02/06/19 1000  BP: 131/73 140/70 130/68 129/72  Pulse: 88 84 83 82  Resp: (!) 21 (!) 22 20 (!) 21  Temp:      TempSrc:      SpO2: 99% 100% 99% 100%  Weight:      Height:        Intake/Output Summary (Last 24 hours) at 02/06/2019 1057 Last data filed at 02/06/2019 0459 Gross per 24 hour  Intake 3330 ml  Output 750 ml  Net 2580 ml   Filed Weights   02/05/19 1700 02/05/19 1950  Weight: 53.1 kg 53 kg    Examination:  General exam: Elderly female lying in bed.  No acute distress.  Looks chronically ill. Respiratory system: Bilateral decreased breath sounds at bases with scattered crackles.  Intermittently tachypneic Cardiovascular system: S1 & S2 heard, Rate controlled Gastrointestinal system: Abdomen is nondistended, soft and nontender. Normal bowel sounds heard. Extremities: No cyanosis, clubbing, edema  Central nervous system: Awake but confused.  No focal neurological deficits. Moving extremities Skin: No rashes, lesions or ulcers Psychiatry: Could not  be assessed because of mental status.    Data Reviewed: I have personally reviewed following labs and imaging studies  CBC: Recent Labs  Lab 02/05/19 1645 02/05/19 2047 02/06/19 0449  WBC  17.3* 16.9* 16.2*  NEUTROABS 14.4*  --   --   HGB 7.3* 7.2* 10.0*  HCT 24.2* 25.0* 31.9*  MCV 106.1* 112.6* 100.6*  PLT 356 291 303   Basic Metabolic Panel: Recent Labs  Lab 02/05/19 1529 02/05/19 2047 02/06/19 0449  NA 147*  --  145  K 4.4  --  3.6  CL 110  --  117*  CO2 24  --  21*  GLUCOSE 140*  --  155*  BUN 30*  --  22  CREATININE 1.52* 1.15* 0.95  CALCIUM 9.0  --  7.7*   GFR: Estimated Creatinine Clearance: 34.5 mL/min (by C-G formula based on SCr of 0.95 mg/dL). Liver Function Tests: Recent Labs  Lab 02/05/19 1529 02/06/19 0449  AST 56* 35  ALT 7 18  ALKPHOS 131* 107  BILITOT 0.5 0.6  PROT 6.7 5.5*  ALBUMIN 2.3* 1.8*   No results for input(s): LIPASE, AMYLASE in the last 168 hours. Recent Labs  Lab 02/05/19 1531  AMMONIA 14   Coagulation Profile: Recent Labs  Lab 02/05/19 1529 02/06/19 0449  INR 1.2 1.3*   Cardiac Enzymes: No results for input(s): CKTOTAL, CKMB, CKMBINDEX, TROPONINI in the last 168 hours. BNP (last 3 results) No results for input(s): PROBNP in the last 8760 hours. HbA1C: No results for input(s): HGBA1C in the last 72 hours. CBG: No results for input(s): GLUCAP in the last 168 hours. Lipid Profile: No results for input(s): CHOL, HDL, LDLCALC, TRIG, CHOLHDL, LDLDIRECT in the last 72 hours. Thyroid Function Tests: Recent Labs    02/05/19 1531  TSH 0.405   Anemia Panel: No results for input(s): VITAMINB12, FOLATE, FERRITIN, TIBC, IRON, RETICCTPCT in the last 72 hours. Sepsis Labs: Recent Labs  Lab 02/05/19 1529 02/06/19 0449  PROCALCITON  --  0.22  LATICACIDVEN 1.9  --     Recent Results (from the past 240 hour(s))  SARS CORONAVIRUS 2 (TAT 6-24 HRS) Nasopharyngeal Nasopharyngeal Swab     Status: None   Collection Time: 02/05/19  4:06 PM   Specimen: Nasopharyngeal Swab  Result Value Ref Range Status   SARS Coronavirus 2 NEGATIVE NEGATIVE Final    Comment: (NOTE) SARS-CoV-2 target nucleic acids are NOT DETECTED. The  SARS-CoV-2 RNA is generally detectable in upper and lower respiratory specimens during the acute phase of infection. Negative results do not preclude SARS-CoV-2 infection, do not rule out co-infections with other pathogens, and should not be used as the sole basis for treatment or other patient management decisions. Negative results must be combined with clinical observations, patient history, and epidemiological information. The expected result is Negative. Fact Sheet for Patients: HairSlick.nohttps://www.fda.gov/media/138098/download Fact Sheet for Healthcare Providers: quierodirigir.comhttps://www.fda.gov/media/138095/download This test is not yet approved or cleared by the [place] FDA and  has been authorized for detection and/or diagnosis of SARS-CoV-2 by FDA under an Emergency Use Authorization (EUA). This EUA will remain  in effect (meaning this test can be used) for the duration of the COVID-19 declaration under Section 56 4(b)(1) of the Act, 21 U.S.C. section 360bbb-3(b)(1), unless the authorization is terminated or revoked sooner. Performed at [facility], [address] [place], [place] [address]          Radiology Studies: Dg Chest Port 1 View  Result Date: 02/05/2019 CLINICAL DATA:  85 year old female with sepsis. EXAM: PORTABLE CHEST 1 VIEW COMPARISON:  Chest radiograph dated 01/12/2019. FINDINGS: Diffuse hazy density throughout the left lung with overall slight decreased left lung volume. Findings concerning for an infectious process, possibly atypical in etiology. Clinical correlation is recommended. There is no focal consolidation, pleural effusion, or pneumothorax. The cardiac silhouette is within normal limits. Calcification of the mitral annulus. Atherosclerotic calcification of the aorta. No acute osseous pathology. Left shoulder fixation sideplate and screws. IMPRESSION: Diffuse hazy density throughout the left lung concerning for an infectious process. Clinical correlation is  recommended. Electronically Signed   By: Anner Crete M.D.   On: 02/05/2019 16:47        Scheduled Meds: . heparin  5,000 Units Subcutaneous Q8H   Continuous Infusions: . sodium chloride    . aztreonam Stopped (02/06/19 0248)  . dextrose 5 % and 0.9% NaCl 125 mL/hr at 02/05/19 1947  . metronidazole Stopped (02/06/19 0214)  . sodium chloride    . [START ON 02/07/2019] vancomycin            Aline August, MD [facility] 02/06/2019, 10:57 AM

## 2019-02-06 NOTE — ED Notes (Signed)
Patient denies pain and is resting comfortably.  

## 2019-02-06 NOTE — Progress Notes (Signed)
Pharmacy Antibiotic Note  Deanna Page is a 85 y.o. female admitted on 02/05/2019 with sepsis.  Pharmacy has been consulted for vancomycin and aztreonam dosing. Pt is afebrile and WBC is down slightly to 16.2. SCr has also improved to 0.95.   Plan: Change vancomycin to 1250mg  IV Q48H Continue aztreonam 1gm IV Q8H F/u renal fxn, C&S, clinical status and peak/trough at SS  Height: 5\' 3"  (160 cm) Weight: 116 lb 13.5 oz (53 kg) IBW/kg (Calculated) : 52.4  Temp (24hrs), Avg:97.7 F (36.5 C), Min:97.4 F (36.3 C), Max:98.2 F (36.8 C)  Recent Labs  Lab 02/05/19 1529 02/05/19 1645 02/05/19 2047 02/06/19 0449  WBC  --  17.3* 16.9* 16.2*  CREATININE 1.52*  --  1.15* 0.95  LATICACIDVEN 1.9  --   --   --     Estimated Creatinine Clearance: 34.5 mL/min (by C-G formula based on SCr of 0.95 mg/dL).    Allergies  Allergen Reactions  . Erythromycin     Other reaction(s): Other (See Comments) Other Reaction: GI Upset Unknown reaction  . Penicillins Rash and Shortness Of Breath    Unknown reaction Has patient had a PCN reaction causing immediate rash, facial/tongue/throat swelling, SOB or lightheadedness with hypotension: Unknown Has patient had a PCN reaction causing severe rash involving mucus membranes or skin necrosis: Unknown Has patient had a PCN reaction that required hospitalization: Unknown Has patient had a PCN reaction occurring within the last 10 years: Unknown If all of the above answers are "NO", then may proceed with Cephalosporin use.   [name] [Donepezil Hcl]     Unknown reaction  . Clindamycin/Lincomycin Itching  . Doxycycline     Unknown reaction  . Pneumococcal Vaccines     Unknown reaction    Antimicrobials this admission: Vanc 12/4>> Aztreo 12/4>> Flagyl 12/4>>  Microbiology results: Pending  Thank you for allowing pharmacy to be a part of this patient's care.  Kaitlyn Skowron, [name] 02/06/2019 10:47 AM

## 2019-02-06 NOTE — Progress Notes (Deleted)
   Vital Signs MEWS/VS Documentation      02/06/2019 1115 02/06/2019 1203 02/06/2019 1207 02/06/2019 1733   MEWS Score:  1  2  1  2    MEWS Score Color:  Green  Yellow  Green  Yellow   Resp:  19  (!) 22  -  18   Pulse:  -  91  -  97   BP:  132/67  132/79  -  127/74   Temp:  -  98.6 F (37 C)  -  (!) 102.8 F (39.3 C)   O2 Device:  -  Nasal Cannula  -  Non-rebreather Mask   O2 Flow Rate (L/min):  -  4 L/min  -  15 L/min   Level of Consciousness:  -  -  Alert  -      Dr Starla Link was made aware on temperature of 102.8 axilllary. Tylenol was ordered suppository and she was given a dose.       [address] Dajai Wahlert R 02/06/2019,6:10 PM

## 2019-02-06 NOTE — Progress Notes (Signed)
02/06/2019 Patient husband do not want her to have the Pneumonia vaccine. [facility]

## 2019-02-06 NOTE — Progress Notes (Signed)
   Vital Signs MEWS/VS Documentation      02/06/2019 1115 02/06/2019 1203 02/06/2019 1207 02/06/2019 1733   MEWS Score:  1  2  1  2    MEWS Score Color:  Green  Yellow  Green  Yellow   Resp:  19  (!) 22  -  18   Pulse:  -  91  -  97   BP:  132/67  132/79  -  127/74   Temp:  -  98.6 F (37 C)  -  (!) 102.8 F (39.3 C)   O2 Device:  -  Nasal Cannula  -  Non-rebreather Mask   O2 Flow Rate (L/min):  -  4 L/min  -  15 L/min   Level of Consciousness:  -  -  Alert  -      Dr Starla Link was made aware on temperature of 102.8 axilllary. Tylenol suppository was ordered and she was given a dose.       [address] Deanna Page R 02/06/2019,6:44 PM

## 2019-02-07 ENCOUNTER — Inpatient Hospital Stay (HOSPITAL_COMMUNITY): Payer: Medicare Other

## 2019-02-07 DIAGNOSIS — D72829 Elevated white blood cell count, unspecified: Secondary | ICD-10-CM

## 2019-02-07 DIAGNOSIS — I10 Essential (primary) hypertension: Secondary | ICD-10-CM

## 2019-02-07 DIAGNOSIS — F0280 Dementia in other diseases classified elsewhere without behavioral disturbance: Secondary | ICD-10-CM

## 2019-02-07 LAB — CBC WITH DIFFERENTIAL/PLATELET
Abs Immature Granulocytes: 0.12 10*3/uL — ABNORMAL HIGH (ref 0.00–0.07)
Basophils Absolute: 0.1 10*3/uL (ref 0.0–0.1)
Basophils Relative: 0 %
Eosinophils Absolute: 0.3 10*3/uL (ref 0.0–0.5)
Eosinophils Relative: 2 %
HCT: 28.9 % — ABNORMAL LOW (ref 36.0–46.0)
Hemoglobin: 9.7 g/dL — ABNORMAL LOW (ref 12.0–15.0)
Immature Granulocytes: 1 %
Lymphocytes Relative: 6 %
Lymphs Abs: 1 10*3/uL (ref 0.7–4.0)
MCH: 32.3 pg (ref 26.0–34.0)
MCHC: 33.6 g/dL (ref 30.0–36.0)
MCV: 96.3 fL (ref 80.0–100.0)
Monocytes Absolute: 1 10*3/uL (ref 0.1–1.0)
Monocytes Relative: 6 %
Neutro Abs: 13.2 10*3/uL — ABNORMAL HIGH (ref 1.7–7.7)
Neutrophils Relative %: 85 %
Platelets: 330 10*3/uL (ref 150–400)
RBC: 3 MIL/uL — ABNORMAL LOW (ref 3.87–5.11)
RDW: 15.5 % (ref 11.5–15.5)
WBC: 15.6 10*3/uL — ABNORMAL HIGH (ref 4.0–10.5)
nRBC: 0 % (ref 0.0–0.2)

## 2019-02-07 LAB — BASIC METABOLIC PANEL
Anion gap: 9 (ref 5–15)
BUN: 16 mg/dL (ref 8–23)
CO2: 22 mmol/L (ref 22–32)
Calcium: 8.2 mg/dL — ABNORMAL LOW (ref 8.9–10.3)
Chloride: 116 mmol/L — ABNORMAL HIGH (ref 98–111)
Creatinine, Ser: 0.88 mg/dL (ref 0.44–1.00)
GFR calc Af Amer: >60 mL/min (ref >60)
GFR calc non Af Amer: 59 mL/min — ABNORMAL LOW (ref >60)
Glucose, Bld: 100 mg/dL — ABNORMAL HIGH (ref 70–99)
Potassium: 3.4 mmol/L — ABNORMAL LOW (ref 3.5–5.1)
Sodium: 147 mmol/L — ABNORMAL HIGH (ref 135–145)

## 2019-02-07 LAB — MAGNESIUM: Magnesium: 1.4 mg/dL — ABNORMAL LOW (ref 1.7–2.4)

## 2019-02-07 MED ORDER — FUROSEMIDE 10 MG/ML IJ SOLN
40.0000 mg | Freq: Once | INTRAMUSCULAR | Status: AC
  Administered 2019-02-07: 40 mg via INTRAVENOUS
  Filled 2019-02-07: qty 4

## 2019-02-07 MED ORDER — HALOPERIDOL LACTATE 5 MG/ML IJ SOLN: 0.5000 mg | INTRAMUSCULAR | Status: DC | PRN

## 2019-02-07 MED ORDER — HALOPERIDOL 0.5 MG PO TABS: 0.5000 mg | ORAL_TABLET | ORAL | Status: DC | PRN

## 2019-02-07 MED ORDER — GLYCOPYRROLATE 0.2 MG/ML IJ SOLN: 0.2000 mg | INTRAMUSCULAR | Status: DC | PRN

## 2019-02-07 MED ORDER — GLYCOPYRROLATE 1 MG PO TABS: 1.0000 mg | ORAL_TABLET | ORAL | Status: DC | PRN

## 2019-02-07 MED ORDER — HALOPERIDOL LACTATE 2 MG/ML PO CONC
0.5000 mg | ORAL | Status: DC | PRN
  Filled 2019-02-07: qty 0.3

## 2019-02-07 MED ORDER — BIOTENE DRY MOUTH MT LIQD: 15.0000 mL | OROMUCOSAL | Status: DC | PRN

## 2019-02-07 MED ORDER — MAGNESIUM SULFATE 2 GM/50ML IV SOLN
2.0000 g | Freq: Once | INTRAVENOUS | Status: AC
  Administered 2019-02-07: 2 g via INTRAVENOUS
  Filled 2019-02-07: qty 50

## 2019-02-07 MED ORDER — POLYVINYL ALCOHOL 1.4 % OP SOLN
1.0000 [drp] | Freq: Four times a day (QID) | OPHTHALMIC | Status: DC | PRN
  Filled 2019-02-07: qty 15

## 2019-02-07 MED ORDER — MORPHINE SULFATE (PF) 2 MG/ML IV SOLN
2.0000 mg | INTRAVENOUS | Status: DC | PRN
  Administered 2019-02-07: 2 mg via INTRAVENOUS
  Administered 2019-02-08: 3 mg via INTRAVENOUS
  Administered 2019-02-08 (×2): 2 mg via INTRAVENOUS
  Administered 2019-02-08: 3 mg via INTRAVENOUS
  Filled 2019-02-07: qty 1
  Filled 2019-02-07: qty 2
  Filled 2019-02-07: qty 1
  Filled 2019-02-07: qty 2
  Filled 2019-02-07: qty 1

## 2019-02-07 NOTE — Progress Notes (Addendum)
Patient ID: AKANE TESSIER, female   DOB: Aug 24, 1929, 85 y.o.   MRN: [id number]  PROGRESS NOTE    Valeria Boza [facility]  LXB:[id number] DOB: January 23, 1930 DOA: 02/05/2019 PCP: Jethro Bastos, MD   Brief Narrative:  85 year old female with history of Parkinson's disease, dementia, chronic kidney disease stage III, asthma, GERD, hypertension, hypothyroidism presented on 02/05/2019 with altered mental status and shortness of breath.  She was initially obtunded on presentation and required nonrebreather and was hypotensive with leukocytosis.  Chest x-ray showed diffuse hazy density throughout the left lung concerning for pneumonia.  CT of the head was negative for acute abnormality.  She was started on IV fluids and antibiotics.  Assessment & Plan:   Possible aspiration pneumonia Severe sepsis: Present on admission -Antibiotics were switched to Rocephin and vancomycin on 02/06/2019.  Cultures negative so far.  Blood pressure improving.  Currently n.p.o. SLP evaluation pending. -IV fluids discontinued on 02/06/2019 evening because of worsening hypoxia.  Acute hypoxic respiratory failure: Present on admission -Required nonrebreather on presentation.  Subsequently was down to 2 L oxygen on 02/06/2019 but oxygen requirement worsened overnight and she required nonrebreather again.  Currently down to 4 L oxygen.  Will repeat chest x-ray.  Use morphine as needed for air hunger.  Acute metabolic encephalopathy History of Parkinson's disease and advanced dementia -Patient was apparently obtunded on presentation.  Currently more awake but does not answer much questions.  Confused.  Unclear if this is her baseline mental status. -Monitor mental status.  CT of the head was negative for acute abnormality on presentation.  Fall precautions. -We will resume regimen for Parkinson's disease once she starts tolerating p.o.  Acute kidney injury on chronic kidney disease stage III -Kidney function  improved. Monitor   Leukocytosis -Probably from sepsis and pneumonia.  Improving.  Monitor  Anemia of chronic disease -Presented with hemoglobin of 7.3 on admission.  She was transfused 1 unit of packed red cells on admission.  Hemoglobin 9.7 today.  Hypertension -Antihypertensives on hold because of hypotension on presentation.  Monitor.  Esophageal dysmotility with GERD  -Probably as result of her Parkinson's disease.  Continue monitoring.  Generalized deconditioning -Overall prognosis is poor.  Palliative care consultation is pending.  Patient is a DNR. -Husband is leaning towards residential hospice if her condition doesn't improve in the next 24-48 hours.  DVT prophylaxis: Heparin Code Status: DNR Family Communication: Spoke to husband/Robert at bedside on 02/07/2019 Disposition Plan: Depends on clinical outcome  Consultants:  palliative care   Procedures: None  Antimicrobials: Azactam, vancomycin and Flagyl from 02/05/2019 onwards   Subjective: Patient seen and examined at bedside.  Very poor historian.  Nursing staff reported worsening shortness of breath overnight.  She also had a temperature spike up to 102.8 last night.  No overnight vomiting.  Wakes up very slightly, hardly answers any questions. Objective: Vitals:   02/06/19 1733 02/06/19 1950 02/06/19 2009 02/06/19 2234  BP: 127/74 103/84  122/62  Pulse: 97 92 90 93  Resp: 18 (!) 36 (!) 28 (!) 24  Temp: (!) 102.8 F (39.3 C) 99.3 F (37.4 C)  98.6 F (37 C)  TempSrc: Axillary Oral  Oral  SpO2: 100% 100% 99% 99%  Weight:      Height:        Intake/Output Summary (Last 24 hours) at 02/07/2019 0755 Last data filed at 02/06/2019 2300 Gross per 24 hour  Intake 200 ml  Output 650 ml  Net -450 ml   [employer]  02/05/19 1700 02/05/19 1950  Weight: 53.1 kg 53 kg    Examination:  General exam: Elderly female lying in bed.  No distress.  Looks chronically ill. Respiratory system: Bilateral decreased breath sounds at bases,  scattered crackles.  Tachypneic intermittently  cardiovascular system: Rate controlled, S1-S2 heard Gastrointestinal system: Abdomen is nondistended, soft and nontender. Normal bowel sounds heard. Extremities: No cyanosis, clubbing; trace lower extremity edema Central nervous system: Sleepy, wakes up slightly but appears confused.  Hardly answers any questions.  No focal neurological deficits. Moving extremities Skin: No rashes, lesions or ulcers Psychiatry: Could not be assessed because of mental status.    Data Reviewed: I have personally reviewed following labs and imaging studies  CBC: Recent Labs  Lab 02/05/19 1645 02/05/19 2047 02/06/19 0449 02/07/19 0213  WBC 17.3* 16.9* 16.2* 15.6*  NEUTROABS 14.4*  --   --  13.2*  HGB 7.3* 7.2* 10.0* 9.7*  HCT 24.2* 25.0* 31.9* 28.9*  MCV 106.1* 112.6* 100.6* 96.3  PLT 356 291 303 330   Basic Metabolic Panel: Recent Labs  Lab 02/05/19 1529 02/05/19 2047 02/06/19 0449 02/07/19 0213  NA 147*  --  145 147*  K 4.4  --  3.6 3.4*  CL 110  --  117* 116*  CO2 24  --  21* 22  GLUCOSE 140*  --  155* 100*  BUN 30*  --  22 16  CREATININE 1.52* 1.15* 0.95 0.88  CALCIUM 9.0  --  7.7* 8.2*  MG  --   --   --  1.4*   GFR: Estimated Creatinine Clearance: 37.3 mL/min (by C-G formula based on SCr of 0.88 mg/dL). Liver Function Tests: Recent Labs  Lab 02/05/19 1529 02/06/19 0449  AST 56* 35  ALT 7 18  ALKPHOS 131* 107  BILITOT 0.5 0.6  PROT 6.7 5.5*  ALBUMIN 2.3* 1.8*   No results for input(s): LIPASE, AMYLASE in the last 168 hours. Recent Labs  Lab 02/05/19 1531  AMMONIA 14   Coagulation Profile: Recent Labs  Lab 02/05/19 1529 02/06/19 0449  INR 1.2 1.3*   Cardiac Enzymes: No results for input(s): CKTOTAL, CKMB, CKMBINDEX, TROPONINI in the last 168 hours. BNP (last 3 results) No results for input(s): PROBNP in the last 8760 hours. HbA1C: No results for input(s): HGBA1C in the last 72 hours. CBG: No results for  input(s): GLUCAP in the last 168 hours. Lipid Profile: No results for input(s): CHOL, HDL, LDLCALC, TRIG, CHOLHDL, LDLDIRECT in the last 72 hours. Thyroid Function Tests: Recent Labs    02/05/19 1531  TSH 0.405   Anemia Panel: No results for input(s): VITAMINB12, FOLATE, FERRITIN, TIBC, IRON, RETICCTPCT in the last 72 hours. Sepsis Labs: Recent Labs  Lab 02/05/19 1529 02/06/19 0449  PROCALCITON  --  0.22  LATICACIDVEN 1.9  --     Recent Results (from the past 240 hour(s))  SARS CORONAVIRUS 2 (TAT 6-24 HRS) Nasopharyngeal Nasopharyngeal Swab     Status: None   Collection Time: 02/05/19  4:06 PM   Specimen: Nasopharyngeal Swab  Result Value Ref Range Status   SARS Coronavirus 2 NEGATIVE NEGATIVE Final    Comment: (NOTE) SARS-CoV-2 target nucleic acids are NOT DETECTED. The SARS-CoV-2 RNA is generally detectable in upper and lower respiratory specimens during the acute phase of infection. Negative results do not preclude SARS-CoV-2 infection, do not rule out co-infections with other pathogens, and should not be used as the sole basis for treatment or other patient management decisions. Negative results must be combined  with clinical observations, patient history, and epidemiological information. The expected result is Negative. Fact Sheet for Patients: SugarRoll.be Fact Sheet for Healthcare Providers: https://www.woods-mathews.com/ This test is not yet approved or cleared by the [place] FDA and  has been authorized for detection and/or diagnosis of SARS-CoV-2 by FDA under an Emergency Use Authorization (EUA). This EUA will remain  in effect (meaning this test can be used) for the duration of the COVID-19 declaration under Section 56 4(b)(1) of the Act, 21 U.S.C. section 360bbb-3(b)(1), unless the authorization is terminated or revoked sooner. Performed at [facility], [place] [address] [place], [place] [address]    Blood Culture (routine x 2)     Status: None (Preliminary result)   Collection Time: 02/05/19  4:26 PM   Specimen: BLOOD RIGHT WRIST  Result Value Ref Range Status   Specimen Description BLOOD RIGHT WRIST  Final   Special Requests   Final    BOTTLES DRAWN AEROBIC AND ANAEROBIC Blood Culture adequate volume   Culture   Final    NO GROWTH < 24 HOURS Performed at [facility], [place] [address] [place], [place] [address]    Report Status PENDING  Incomplete  Blood Culture (routine x 2)     Status: None (Preliminary result)   Collection Time: 02/05/19  4:59 PM   Specimen: BLOOD  Result Value Ref Range Status   Specimen Description BLOOD BLOOD RIGHT FOREARM  Final   Special Requests   Final    BOTTLES DRAWN AEROBIC AND ANAEROBIC Blood Culture results may not be optimal due to an inadequate volume of blood received in culture bottles   Culture   Final    NO GROWTH < 24 HOURS Performed at [facility], [place] [address], [place], [place] [address]    Report Status PENDING  Incomplete         Radiology Studies: Dg Chest Port 1 View  Result Date: 02/05/2019 CLINICAL DATA:  85 year old female with sepsis. EXAM: PORTABLE CHEST 1 VIEW COMPARISON:  Chest radiograph dated 01/12/2019. FINDINGS: Diffuse hazy density throughout the left lung with overall slight decreased left lung volume. Findings concerning for an infectious process, possibly atypical in etiology. Clinical correlation is recommended. There is no focal consolidation, pleural effusion, or pneumothorax. The cardiac silhouette is within normal limits. Calcification of the mitral annulus. Atherosclerotic calcification of the aorta. No acute osseous pathology. Left shoulder fixation sideplate and screws. IMPRESSION: Diffuse hazy density throughout the left lung concerning for an infectious process. Clinical correlation is recommended. Electronically Signed   By: Anner Crete M.D.   On: 02/05/2019 16:47        Scheduled  Meds: . chlorhexidine  15 mL Mouth Rinse BID  . Chlorhexidine Gluconate Cloth  6 each Topical Daily  . heparin  5,000 Units Subcutaneous Q8H  . mouth rinse  15 mL Mouth Rinse q12n4p   Continuous Infusions: . sodium chloride    . cefTRIAXone (ROCEPHIN)  IV 1 g (02/06/19 1446)  . sodium chloride    . vancomycin            Aline August, MD [facility] 02/07/2019, 7:55 AM

## 2019-02-07 NOTE — Consult Note (Signed)
Consultation Note Date: 02/07/2019   Patient Name: Deanna Page  DOB: 1930-01-14  MRN: [id number]  Age / Sex: 85 y.o., female  PCP: Janifer Adie, MD Referring Physician: Aline August, MD  Reason for Consultation: Establishing goals of care, Inpatient hospice referral and Psychosocial/spiritual support  HPI/Patient Profile: 85 y.o. female  with past medical history of parkinsons, dementia, CVA (2020), dyphagia, CKD3 and ischemic colitis/bowel obstruction who was admitted on 02/05/2019 obtunded and hypotensive. She was found to have left sided pneumonia due to aspiration.  She was evaluated by SLP who found her to be at high risk for aspiration and recommended NPO  Clinical Assessment and Goals of Care:  I have reviewed medical records including EPIC notes, labs and imaging, received report from the care team, assessed the patient and then spoke with her husband and son  to discuss diagnosis prognosis, GOC, EOL wishes, disposition and options.  I introduced Palliative Medicine as specialized medical care for people living with serious illness. It focuses on providing relief from the symptoms and stress of a serious illness. The goal is to improve quality of life for both the patient and the family.  Elta Guadeloupe (Son) asked about Palliative vs Hospice so I explained the difference between the two and when each was appropriate.  I also spoke of [place] - and the differences in services provided there.  We discussed a brief life review of the patient. The Richner's have been married for over 74 years.  She is described as bright, vibrant, friendly, and sharp.  She has suffered with Parkinsons for over 8 years.  She has been well cared [facility] by [facility] - and the family has been very happy with PACE.  Most recently she was in [facility] and was not faring well.  THe family was able to bring her home  for 5 weeks.  Elta Guadeloupe reflects on what a special time that was.  The patient was able to go to the beauty parlor and have her hair done (very important to her).  Unfortunately she stopped taking her medications and became paranoid.  We discussed her current illness and what it means in the larger context of her on-going co-morbidities.  Natural disease trajectory and expectations at EOL were discussed.  Specifically [name] is aspirating significantly and unable to take any PO.  This is a terminal illness.  The difference between aggressive medical intervention and comfort care was considered in light of the patient's goals of care.  Elta Guadeloupe and Mr. Slaubaugh agree that comfort is the priority.  The family recognizes that she is near end of life.  Hospice and Palliative Care services outpatient were explained and offered.  The family requests transfer to [facility] as soon as a bed is available.    Questions and concerns were addressed. The family was encouraged to call with questions or concerns.    Primary Decision Maker:  NEXT OF KIN  Husband Robert Mussa.  Son Elta Guadeloupe is the back up.  SUMMARY OF RECOMMENDATIONS    "light" comfort meds ordered PRN.  Family sees the decline and realizes she is near EOL, but would like as much quality time with her as possible.  [facility] bed requested.  Patient is ready for transfer ASAP.  Discussed with CSW and Psychologist, sport and exercise Liasion  Code Status/Advance Care Planning:  DNR   Symptom Management:   Morphine PRN  Additional Recommendations (Limitations, Scope, Preferences):  Full Comfort Care   Can very carefully offer comfort feeds with aspiration precautions if she is alert.  Palliative Prophylaxis:   Aspiration  Psycho-social/Spiritual:   Desire for further Chaplaincy support: not discussed.  Prognosis:  Less than two weeks.  Advanced Parkinsons dementia with severe aspiration.  Not chewing or swallowing.   Discharge Planning:  Hospice facility      Primary Diagnoses: Present on Admission: . Septic shock (HCC) . Essential hypertension . Parkinson's disease (HCC) . Dementia (HCC) . CKD (chronic kidney disease), stage III . Hypothyroidism . Aspiration pneumonia (HCC)   I have reviewed the medical record, interviewed the patient and family, and examined the patient. The following aspects are pertinent.  Past Medical History:  Diagnosis Date  . Allergic rhinitis   . Anxiety   . Asthma   . Bowel obstruction (HCC)   . CKD (chronic kidney disease), stage III   . Dementia (HCC)   . Diverticulosis   . Esophageal dysmotility   . Esophageal stricture 2005  . GERD (gastroesophageal reflux disease)   . Hepatitis A   . Hiatal hernia   . Hypertension   . Hypothyroidism   . Internal hemorrhoids   . Irritable bowel syndrome   . Ischemic colitis, enteritis, or enterocolitis (HCC) 2010  . Major depression    with paranoia and insomnia  . OSA (obstructive sleep apnea)   . Parkinson disease (HCC)   . Pneumonia   . TIA (transient ischemic attack)   . Urinary incontinence   . Vitamin D deficiency    Social History   Socioeconomic History  . Marital status: Married    Spouse name: Molly Maduro  . Number of children: 3  . Years of education: 43  . Highest education level: Not on file  Occupational History  . Occupation: Retired    Associate Professor: RETIRED  Social Needs  . Financial resource strain: Not on file  . Food insecurity    Worry: Not on file    Inability: Not on file  . Transportation needs    Medical: Not on file    Non-medical: Not on file  Tobacco Use  . Smoking status: Never Smoker  . Smokeless tobacco: Never Used  Substance and Sexual Activity  . Alcohol use: No    Alcohol/week: 0.0 standard drinks  . Drug use: No  . Sexual activity: Not on file  Lifestyle  . Physical activity    Days per week: Not on file    Minutes per session: Not on file  . Stress: Not on file  Relationships  .  Social Musician on phone: Not on file    Gets together: Not on file    Attends religious service: Not on file    Active member of club or organization: Not on file    Attends meetings of clubs or organizations: Not on file    Relationship status: Not on file  Other Topics Concern  . Not on file  Social History Narrative   Patient is married Molly Maduro) and lives with her  husband.   Patient has three children.   Patient is retired.   Patient has an high school education.   Patient does drink caffeine beverages.   Patient is left handed.         Family History  Problem Relation Age of Onset  . Stroke Mother   . Parkinson's disease Mother   . Lung cancer Father   . ALS Sister   . Liver disease Sister   . Parkinson's disease Maternal Grandmother   . Parkinson's disease Maternal Aunt   . Emphysema Sister   . Emphysema Brother   . Colon cancer Neg Hx   . Colon polyps Neg Hx   . Diabetes Neg Hx   . Gallbladder disease Neg Hx   . Esophageal cancer Neg Hx    Scheduled Meds: . chlorhexidine  15 mL Mouth Rinse BID  . Chlorhexidine Gluconate Cloth  6 each Topical Daily  . mouth rinse  15 mL Mouth Rinse q12n4p   Continuous Infusions: . sodium chloride    . cefTRIAXone (ROCEPHIN)  IV 1 g (02/07/19 1140)  . sodium chloride     PRN Meds:.acetaminophen, antiseptic oral rinse, glycopyrrolate **OR** glycopyrrolate **OR** glycopyrrolate, haloperidol **OR** haloperidol **OR** haloperidol lactate, morphine injection, ondansetron **OR** ondansetron (ZOFRAN) IV, polyvinyl alcohol Allergies  Allergen Reactions  . Erythromycin     Other reaction(s): Other (See Comments) Other Reaction: GI Upset Unknown reaction  . Penicillins Rash and Shortness Of Breath    Unknown reaction Has patient had a PCN reaction causing immediate rash, facial/tongue/throat swelling, SOB or lightheadedness with hypotension: Unknown Has patient had a PCN reaction causing severe rash involving mucus  membranes or skin necrosis: Unknown Has patient had a PCN reaction that required hospitalization: Unknown Has patient had a PCN reaction occurring within the last 10 years: Unknown If all of the above answers are "NO", then may proceed with Cephalosporin use.   Mart Piggs [Donepezil Hcl]     Unknown reaction  . Clindamycin/Lincomycin Itching  . Doxycycline     Unknown reaction  . Pneumococcal Vaccines     Unknown reaction   Review of Systems Patient unable to give  Physical Exam  Thin frail elderly female, eyes open but she does not track or respond to me. Lower jaw with continuous movement in time with her breathing. CV rrr resp mild to moderate distress, no frank crackles or rales Abdomen soft, nt, nd  Vital Signs: BP (!) 145/81 (BP Location: Left Arm)   Pulse 88   Temp 98.5 F (36.9 C) (Oral)   Resp (!) 24   Ht  (1.6 m)   Wt 53 kg   SpO2 98%   BMI 20.70 kg/m  Pain Scale: 0-10   Pain Score: Asleep   SpO2: SpO2: 98 % O2 Device:SpO2: 98 % O2 Flow Rate: .O2 Flow Rate (L/min): 5 L/min  IO: Intake/output summary:   Intake/Output Summary (Last 24 hours) at 02/07/2019 1618 Last data filed at 02/06/2019 2300 Gross per 24 hour  Intake 100 ml  Output 650 ml  Net -550 ml    LBM: Last BM Date: (not sure when last bm) Baseline Weight: Weight: 53.1 kg(per previous visit) Most recent weight: Weight: 53 kg     Palliative Assessment/Data: 10%     Time In: 2:00 Time Out: 3:10 Time Total: 70 min Visit consisted of counseling and education dealing with the complex and emotionally intense issues surrounding the need for palliative care and symptom management in the setting of serious  and potentially life-threatening illness. Greater than 50%  of this time was spent counseling and coordinating care related to the above assessment and plan.  Signed by: Norvel RichardsMarianne Karo Rog, PA-C Palliative Medicine Pager: [phone]  Please contact Palliative Medicine Team phone at  [phone] for questions and concerns.  For individual provider: See Loretha StaplerAmion

## 2019-02-07 NOTE — Progress Notes (Signed)
CSW is following for placement at residential hospice. CSW called Lattie Haw with [facility]. Deanna Page has made the referral and Lattie Haw is reviewing her chart. They have to get approval for [facility].   CSW will continue to follow and assist with disposition planning.   Domenic Schwab, MSW, [place] Worker [facility]  [phone]

## 2019-02-07 NOTE — Progress Notes (Signed)
Manufacturing engineer [facility]) Hospital Liaison: RN note @1600    Received request from Palliative Medicine Latanya Presser, [place]) confirmed by CSW Nunzio Cory) for family interest in [facility].  Chart reviewed and spoke with spouse Herbie Baltimore) to acknowledge referral.    Family and CSW are aware [place] liaison will follow up with CSW and family based upon eligibility.   Please do not hesitate to call with questions.  Thank you. ?  Vicente Serene, [facility] Hospital Liaison  [phone]   [place] are on [place]

## 2019-02-07 NOTE — Progress Notes (Signed)
SLP Note  Patient Details Name: Deanna Page MRN: [id number] DOB: Jun 29, 1929   New orders were received, pt actually assessed yesterday by SLP but no note was added.     Impression statement by Marijo File, SLP reads:   Pt presents with oral dysphagia and clincial indicators of pharyngeal dysphagia in setting of possible aspiration pneumonia, dementia, parkinson's disease, GERD, and known hx of esophageal dysmotility. Pt on non-rebreather on SLP arrival. RN transitioned pt to nasal cannula. Pt exhibited increased WOB throughout evaluation. SLP provided thorough, aggressive oral care prior to administration of PO trials. Oral cavity and dentition in very poor condition. Dried secretions coating teeth and throughout oral cavity. Dorsal surface of tongue was black. SLP used suction and mouthwash to remove as many dried secretions as possible. Pt slightly resistant to oral care, but not combative. Pt exhibited poor oral acceptance of bolus trials. Mandibular tremor and grinding of teeth noted. Pt did not masticate ice chip once placed in mouth. There was no swallow palpated with ice chip or small amount of thin liquid by spoon. There was significant bolus loss from L side of oral cavity. On second of 2 trials of thin liquid, there was wet cough. Recommend pt remain NPO at present. Pt is not appropriate for further evaluation of swallow function at this time. SLP will follow for PO readiness. Consider discussion around goals of care.     Given this result will plan to f/u on Monday as scheduled. Sorry for the confusion.   Herbie Baltimore, MA CCC-SLP  Acute Rehabilitation Services Pager [phone] Office [phone]  Lynann Beaver 02/07/2019, 11:25 AM

## 2019-02-08 DIAGNOSIS — Z515 Encounter for palliative care: Secondary | ICD-10-CM

## 2019-02-08 DIAGNOSIS — Z7189 Other specified counseling: Secondary | ICD-10-CM

## 2019-02-08 DIAGNOSIS — F0390 Unspecified dementia without behavioral disturbance: Secondary | ICD-10-CM

## 2019-02-08 LAB — URINE CULTURE: Culture: >=100000 — AB

## 2019-02-08 MED ORDER — GLYCOPYRROLATE 0.2 MG/ML IJ SOLN: 0.2000 mg | INTRAMUSCULAR | Status: AC | PRN

## 2019-02-08 MED ORDER — ONDANSETRON HCL 4 MG/2ML IJ SOLN: 4.0000 mg | Freq: Four times a day (QID) | INTRAMUSCULAR | Status: AC | PRN

## 2019-02-08 MED ORDER — LORAZEPAM 2 MG/ML IJ SOLN: 1.0000 mg | INTRAMUSCULAR | Status: AC | PRN

## 2019-02-08 MED ORDER — HALOPERIDOL LACTATE 2 MG/ML PO CONC: 1.0000 mg | ORAL | Status: AC | PRN

## 2019-02-08 MED ORDER — GLYCOPYRROLATE 1 MG PO TABS: 1.0000 mg | ORAL_TABLET | ORAL | Status: AC | PRN

## 2019-02-08 MED ORDER — LORAZEPAM 2 MG/ML IJ SOLN
1.0000 mg | INTRAMUSCULAR | Status: DC | PRN
  Administered 2019-02-08: 1 mg via INTRAVENOUS
  Filled 2019-02-08: qty 1

## 2019-02-08 MED ORDER — MORPHINE 100MG IN NS 100ML (1MG/ML) PREMIX INFUSION
1.0000 mg/h | INTRAVENOUS | Status: DC
  Administered 2019-02-08: 1 mg/h via INTRAVENOUS
  Filled 2019-02-08: qty 100

## 2019-02-08 MED ORDER — MORPHINE SULFATE (PF) 2 MG/ML IV SOLN: 2.0000 mg | INTRAVENOUS | Status: AC | PRN

## 2019-02-08 MED ORDER — MORPHINE 100MG IN NS 100ML (1MG/ML) PREMIX INFUSION: 1.0000 mg/h | INTRAVENOUS | Status: AC

## 2019-02-08 NOTE — Progress Notes (Signed)
[facility] room available for Deanna Page today. Spouse plans to visit her at the hospital before making decision on transfer. He will call me once decision has been made. He is aware paper work will need to be completed prior to transfer. CSW [place] aware.  Thank you,  Erling Conte, LCSW [phone]

## 2019-02-08 NOTE — Care Management Important Message (Signed)
Important Message  Patient Details  Name: Deanna Page MRN: [id number] Date of Birth: 1929-03-07   Medicare Important Message Given:  Yes     Dorathea Faerber 02/08/2019, 3:05 PM

## 2019-02-08 NOTE — Progress Notes (Signed)
[facility]  Continue to follow for family interest in [place] transfer. Chart under review. Appreciate Palliative Medicine note. Will follow up with [facility] manager and family regarding availability this morning.   Thank you,  Erling Conte, LCSW [phone]

## 2019-02-08 NOTE — TOC Transition Note (Signed)
Transition of Care [facility]) - CM/SW Discharge Note   Patient Details  Name: RAYGEN LINQUIST MRN: [id number] Date of Birth: [birth year]  Transition of Care [facility]) CM/SW Contact:  Sharin Mons, RN Phone Number: 02/08/2019, 3:49 PM   Clinical Narrative:      Patient will DC to: [place] date:02/08/2019 Family notified: husband Transport by: Corey Harold   Per MD patient ready for DC to [facility] . RN, patient, patient's family, and facility notified of DC. Discharge Summary and FL2 sent to facility. RN to call report prior to discharge [phone]). DC packet on chart. Ambulance transport requested for patient.   NCM will sign off for now as intervention is no longer needed. Please consult [place] again if new needs arise.   Final next level of care: [place] Barriers to Discharge: No Barriers Identified   Patient Goals and CMS Choice        Discharge Placement   Discharge Plan and Services            Social Determinants of Health (SDOH) Interventions     Readmission Risk Interventions Readmission Risk Prevention Plan 07/24/2018  Transportation Screening Complete  PCP or Specialist Appt within 3-5 Days Complete  HRI or [place] Not Complete  HRI or Home Care Consult comments Admission to SNF  Social Work Consult for [place] Planning/Counseling Complete  Palliative Care Screening Not Applicable  Some recent data might be hidden

## 2019-02-08 NOTE — Discharge Summary (Signed)
Physician Discharge Summary  Deanna Page [facility] XBM:[id number] DOB: Sep 05, 1929 DOA: 02/05/2019  PCP: Jethro Bastos, MD  Admit date: 02/05/2019 Discharge date: 02/08/2019  Admitted From: SNF Disposition: Residential hospice  Recommendations for Outpatient Follow-up:  Follow up with residential hospice at earliest convenience  Home Health: No Equipment/Devices: None  Discharge Condition: Poor CODE STATUS: DNR Diet recommendation: For comfort  Brief/Interim Summary: 85 year old female with history of Parkinson's disease, dementia, chronic kidney disease stage III, asthma, GERD, hypertension, hypothyroidism presented on 02/05/2019 with altered mental status and shortness of breath.  She was initially obtunded on presentation and required nonrebreather and was hypotensive with leukocytosis.  Chest x-ray showed diffuse hazy density throughout the left lung concerning for pneumonia.  CT of the head was negative for acute abnormality.  She was started on IV fluids and antibiotics.  Because of overall poor prognosis, palliative care was consulted.  After discussion with patient's family, family has decided to pursue residential hospice.  She will be discharged to residential hospice once bed is available.  Discharge Diagnoses:  Possible aspiration pneumonia Severe sepsis: Present on admission Acute hypoxic respiratory failure: Present on admission Acute metabolic encephalopathy History of Parkinson's disease and advanced dementia Acute kidney injury on chronic kidney disease stage III Leukocytosis Anemia of chronic disease Hypertension Esophageal dysmotility with GERD  Generalized deconditioning  Plan -Patient was initially started on intravenous antibiotics and IV fluids.  She intermittently required nonrebreather and had to be given intermittent doses of morphine for air hunger.  After discussion with family and palliative care team, family has decided to pursue residential hospice.   Will discontinue IV antibiotics.  This morning, patient looks in more distress and even more tachypneic.  Will add IV Ativan on top of as needed morphine.    Will start morphine drip as well.  Discharge to residential hospice once bed is available.  Discharge Instructions   Allergies as of 02/08/2019      Reactions   Erythromycin    Other reaction(s): Other (See Comments) Other Reaction: GI Upset Unknown reaction   Penicillins Rash, Shortness Of Breath   Unknown reaction Has patient had a PCN reaction causing immediate rash, facial/tongue/throat swelling, SOB or lightheadedness with hypotension: Unknown Has patient had a PCN reaction causing severe rash involving mucus membranes or skin necrosis: Unknown Has patient had a PCN reaction that required hospitalization: Unknown Has patient had a PCN reaction occurring within the last 10 years: Unknown If all of the above answers are "NO", then may proceed with Cephalosporin use.   Aricept [donepezil Hcl]    Unknown reaction   Clindamycin/lincomycin Itching   Doxycycline    Unknown reaction   Pneumococcal Vaccines    Unknown reaction      Medication List    STOP taking these medications   acetaminophen 325 MG tablet Commonly known as: TYLENOL   carbidopa-levodopa 25-100 MG tablet Commonly known as: SINEMET IR   citalopram 20 MG tablet Commonly known as: CELEXA   clonazePAM 0.5 MG tablet Commonly known as: KLONOPIN   fluorometholone 0.1 % ophthalmic suspension Commonly known as: FML   levothyroxine 50 MCG tablet Commonly known as: SYNTHROID   memantine 10 MG tablet Commonly known as: NAMENDA   pantoprazole 20 MG tablet Commonly known as: PROTONIX   pravastatin 20 MG tablet Commonly known as: PRAVACHOL   QUEtiapine 100 MG tablet Commonly known as: SEROQUEL   sennosides-docusate sodium 8.6-50 MG tablet Commonly known as: SENOKOT-S   trimethoprim 100 MG tablet Commonly known as:  TRIMPEX   Vitamin D  (Cholecalciferol) 25 MCG (1000 UT) Caps     TAKE these medications   glycopyrrolate 1 MG tablet Commonly known as: ROBINUL Take 1 tablet (1 mg total) by mouth every 4 (four) hours as needed (excessive secretions).   glycopyrrolate 0.2 MG/ML injection Commonly known as: ROBINUL Inject 1 mL (0.2 mg total) into the skin every 4 (four) hours as needed (excessive secretions).   haloperidol 2 MG/ML solution Commonly known as: HALDOL Place 0.5 mLs (1 mg total) under the tongue every 4 (four) hours as needed for agitation (or delirium).   LORazepam 2 MG/ML injection Commonly known as: ATIVAN Inject 0.5 mLs (1 mg total) into the vein every 4 (four) hours as needed for anxiety.   morphine 1 mg/mL Soln infusion Inject 1-10 mg/hr into the vein continuous.   morphine 2 MG/ML injection Inject 1-1.5 mLs (2-3 mg total) into the vein every hour as needed [employer]).   ondansetron 4 MG/2ML Soln injection Commonly known as: ZOFRAN Inject 2 mLs (4 mg total) into the vein every 6 (six) hours as needed for nausea.       Allergies  Allergen Reactions  . Erythromycin     Other reaction(s): Other (See Comments) Other Reaction: GI Upset Unknown reaction  . Penicillins Rash and Shortness Of Breath    Unknown reaction Has patient had a PCN reaction causing immediate rash, facial/tongue/throat swelling, SOB or lightheadedness with hypotension: Unknown Has patient had a PCN reaction causing severe rash involving mucus membranes or skin necrosis: Unknown Has patient had a PCN reaction that required hospitalization: Unknown Has patient had a PCN reaction occurring within the last 10 years: Unknown If all of the above answers are "NO", then may proceed with Cephalosporin use.   Mart Piggs [Donepezil Hcl]     Unknown reaction  . Clindamycin/Lincomycin Itching  . Doxycycline     Unknown reaction  . Pneumococcal Vaccines     Unknown reaction    Consultations:  Palliative  care   Procedures/Studies: Dg Chest Port 1 View  Result Date: 02/07/2019 CLINICAL DATA:  Dyspnea.  Asthma. EXAM: PORTABLE CHEST 1 VIEW COMPARISON:  02/05/2019 FINDINGS: Normal heart size. Interval complete atelectasis of the left lower lobe with veil like opacification of the left hemithorax. There is shift of the mediastinum into the left hemithorax with hyperexpansion of the right lung. Previous hardware fixation of the proximal left humerus. IMPRESSION: 1. Left hemithorax volume loss with veil like opacification of the anterior left lung favored to represent sequelae of complete left lower lobe atelectasis. Findings may be due to mucous plugging. Recommend follow-up imaging following respiratory therapy. If aeration to the left lung does not improve consider further investigation with CT of the chest to assess for underlying obstructing lesion. Electronically Signed   By: Signa Kell M.D.   On: 02/07/2019 08:59   Dg Chest Port 1 View  Result Date: 02/05/2019 CLINICAL DATA:  85 year old female with sepsis. EXAM: PORTABLE CHEST 1 VIEW COMPARISON:  Chest radiograph dated 01/12/2019. FINDINGS: Diffuse hazy density throughout the left lung with overall slight decreased left lung volume. Findings concerning for an infectious process, possibly atypical in etiology. Clinical correlation is recommended. There is no focal consolidation, pleural effusion, or pneumothorax. The cardiac silhouette is within normal limits. Calcification of the mitral annulus. Atherosclerotic calcification of the aorta. No acute osseous pathology. Left shoulder fixation sideplate and screws. IMPRESSION: Diffuse hazy density throughout the left lung concerning for an infectious process. Clinical correlation is  recommended. Electronically Signed   By: Elgie Collard M.D.   On: 02/05/2019 16:47   Dg Chest Portable 1 View  Result Date: 01/12/2019 CLINICAL DATA:  Fever, possible aspiration. EXAM: PORTABLE CHEST 1 VIEW COMPARISON:   January 10, 2019. FINDINGS: The heart size and mediastinal contours are within normal limits. Both lungs are clear. The visualized skeletal structures are unremarkable. Atherosclerosis of thoracic aorta. IMPRESSION: No active disease. Aortic Atherosclerosis (ICD10-I70.0). Electronically Signed   By: Lupita Raider M.D.   On: 01/12/2019 08:04   Dg Chest Portable 1 View  Result Date: 01/10/2019 CLINICAL DATA:  Fever and cough EXAM: PORTABLE CHEST 1 VIEW COMPARISON:  None. FINDINGS: The heart size and mediastinal contours are within normal limits. Both lungs are clear. The visualized skeletal structures are unremarkable. IMPRESSION: No active disease. Electronically Signed   By: Deatra Robinson M.D.   On: 01/10/2019 21:57       Subjective: Patient seen and examined at bedside.  Very poor historian.  Awake but hardly answers any questions.  Looks extremely dyspneic.    Discharge Exam: Vitals:   02/08/19 0900 02/08/19 1200  BP:    Pulse: (!) 101 (!) 108  Resp: (!) 35 (!) 30  Temp:    SpO2: (!) 84% (!) 87%    General exam: Elderly female lying in bed.  Looks chronically ill.  Looks in moderate distress secondary to dyspnea. Respiratory system: Bilateral decreased breath sounds at bases with scattered crackles.   Cardiovascular system: S1-S2 heard, rate controlled     The results of significant diagnostics from this hospitalization (including imaging, microbiology, ancillary and laboratory) are listed below for reference.     Microbiology: Recent Results (from the past 240 hour(s))  SARS CORONAVIRUS 2 (TAT 6-24 HRS) Nasopharyngeal Nasopharyngeal Swab     Status: None   Collection Time: 02/05/19  4:06 PM   Specimen: Nasopharyngeal Swab  Result Value Ref Range Status   SARS Coronavirus 2 NEGATIVE NEGATIVE Final    Comment: (NOTE) SARS-CoV-2 target nucleic acids are NOT DETECTED. The SARS-CoV-2 RNA is generally detectable in upper and lower respiratory specimens during the acute  phase of infection. Negative results do not preclude SARS-CoV-2 infection, do not rule out co-infections with other pathogens, and should not be used as the sole basis for treatment or other patient management decisions. Negative results must be combined with clinical observations, patient history, and epidemiological information. The expected result is Negative. Fact Sheet for Patients: HairSlick.no Fact Sheet for Healthcare Providers: quierodirigir.com This test is not yet approved or cleared by the [place] FDA and  has been authorized for detection and/or diagnosis of SARS-CoV-2 by FDA under an Emergency Use Authorization (EUA). This EUA will remain  in effect (meaning this test can be used) for the duration of the COVID-19 declaration under Section 56 4(b)(1) of the Act, 21 U.S.C. section 360bbb-3(b)(1), unless the authorization is terminated or revoked sooner. Performed at [facility], [address], [place], [place] [address]   Blood Culture (routine x 2)     Status: None (Preliminary result)   Collection Time: 02/05/19  4:26 PM   Specimen: BLOOD RIGHT WRIST  Result Value Ref Range Status   Specimen Description BLOOD RIGHT WRIST  Final   Special Requests   Final    BOTTLES DRAWN AEROBIC AND ANAEROBIC Blood Culture adequate volume   Culture   Final    NO GROWTH 2 DAYS Performed at [facility], [address]  [place], [place] [address]    Report Status PENDING  Incomplete  Blood Culture (routine x 2)     Status: None (Preliminary result)   Collection Time: 02/05/19  4:59 PM   Specimen: BLOOD  Result Value Ref Range Status   Specimen Description BLOOD BLOOD RIGHT FOREARM  Final   Special Requests   Final    BOTTLES DRAWN AEROBIC AND ANAEROBIC Blood Culture results may not be optimal due to an inadequate volume of blood received in culture bottles   Culture   Final    NO GROWTH 2 DAYS Performed at  [facility], [address] [place], [place] [address]    Report Status PENDING  Incomplete  Urine culture     Status: Abnormal   Collection Time: 02/06/19  4:31 AM   Specimen: In/Out Cath Urine  Result Value Ref Range Status   Specimen Description IN/OUT CATH URINE  Final   Special Requests   Final    NONE Performed at [facility], [address], [place], [place] [address]    Culture >=100,000 COLONIES/mL ENTEROCOCCUS FAECALIS (A)  Final   Report Status 02/08/2019 FINAL  Final   Organism ID, Bacteria ENTEROCOCCUS FAECALIS (A)  Final      Susceptibility   Enterococcus faecalis - MIC*    AMPICILLIN <=2 SENSITIVE Sensitive     LEVOFLOXACIN 1 SENSITIVE Sensitive     NITROFURANTOIN <=16 SENSITIVE Sensitive     VANCOMYCIN 1 SENSITIVE Sensitive     * >=100,000 COLONIES/mL ENTEROCOCCUS FAECALIS     Labs: BNP (last 3 results) Recent Labs    02/05/19 1532  BNP 94.0   Basic Metabolic Panel: Recent Labs  Lab 02/05/19 1529 02/05/19 2047 02/06/19 0449 02/07/19 0213  NA 147*  --  145 147*  K 4.4  --  3.6 3.4*  CL 110  --  117* 116*  CO2 24  --  21* 22  GLUCOSE 140*  --  155* 100*  BUN 30*  --  22 16  CREATININE 1.52* 1.15* 0.95 0.88  CALCIUM 9.0  --  7.7* 8.2*  MG  --   --   --  1.4*   Liver Function Tests: Recent Labs  Lab 02/05/19 1529 02/06/19 0449  AST 56* 35  ALT 7 18  ALKPHOS 131* 107  BILITOT 0.5 0.6  PROT 6.7 5.5*  ALBUMIN 2.3* 1.8*   No results for input(s): LIPASE, AMYLASE in the last 168 hours. Recent Labs  Lab 02/05/19 1531  AMMONIA 14   CBC: Recent Labs  Lab 02/05/19 1645 02/05/19 2047 02/06/19 0449 02/07/19 0213  WBC 17.3* 16.9* 16.2* 15.6*  NEUTROABS 14.4*  --   --  13.2*  HGB 7.3* 7.2* 10.0* 9.7*  HCT 24.2* 25.0* 31.9* 28.9*  MCV 106.1* 112.6* 100.6* 96.3  PLT 356 291 303 330   Cardiac Enzymes: No results for input(s): CKTOTAL, CKMB, CKMBINDEX, TROPONINI in the last 168 hours. BNP: Invalid input(s): POCBNP CBG: No  results for input(s): GLUCAP in the last 168 hours. D-Dimer No results for input(s): DDIMER in the last 72 hours. Hgb A1c No results for input(s): HGBA1C in the last 72 hours. Lipid Profile No results for input(s): CHOL, HDL, LDLCALC, TRIG, CHOLHDL, LDLDIRECT in the last 72 hours. Thyroid function studies Recent Labs    02/05/19 1531  TSH 0.405   Anemia work up No results for input(s): VITAMINB12, FOLATE, FERRITIN, TIBC, IRON, RETICCTPCT in the last 72 hours. Urinalysis    Component Value  Date/Time   COLORURINE YELLOW 02/06/2019 [place] 02/06/2019 0432   LABSPEC 1.019 02/06/2019 0432   PHURINE 5.0 02/06/2019 0432   GLUCOSEU NEGATIVE 02/06/2019 0432   HGBUR NEGATIVE 02/06/2019 0432   BILIRUBINUR NEGATIVE 02/06/2019 0432   KETONESUR NEGATIVE 02/06/2019 0432   PROTEINUR NEGATIVE 02/06/2019 0432   UROBILINOGEN 0.2 08/02/2009 0032   NITRITE NEGATIVE 02/06/2019 0432   LEUKOCYTESUR NEGATIVE 02/06/2019 0432   Sepsis Labs Invalid input(s): PROCALCITONIN,  WBC,  LACTICIDVEN Microbiology Recent Results (from the past 240 hour(s))  SARS CORONAVIRUS 2 (TAT 6-24 HRS) Nasopharyngeal Nasopharyngeal Swab     Status: None   Collection Time: 02/05/19  4:06 PM   Specimen: Nasopharyngeal Swab  Result Value Ref Range Status   SARS Coronavirus 2 NEGATIVE NEGATIVE Final    Comment: (NOTE) SARS-CoV-2 target nucleic acids are NOT DETECTED. The SARS-CoV-2 RNA is generally detectable in upper and lower respiratory specimens during the acute phase of infection. Negative results do not preclude SARS-CoV-2 infection, do not rule out co-infections with other pathogens, and should not be used as the sole basis for treatment or other patient management decisions. Negative results must be combined with clinical observations, patient history, and epidemiological information. The expected result is Negative. Fact Sheet for Patients: SugarRoll.be Fact  Sheet for Healthcare Providers: https://www.woods-mathews.com/ This test is not yet approved or cleared by the [place] FDA and  has been authorized for detection and/or diagnosis of SARS-CoV-2 by FDA under an Emergency Use Authorization (EUA). This EUA will remain  in effect (meaning this test can be used) for the duration of the COVID-19 declaration under Section 56 4(b)(1) of the Act, 21 U.S.C. section 360bbb-3(b)(1), unless the authorization is terminated or revoked sooner. Performed at [facility], [place] [address], [place], [place] [address]   Blood Culture (routine x 2)     Status: None (Preliminary result)   Collection Time: 02/05/19  4:26 PM   Specimen: BLOOD RIGHT WRIST  Result Value Ref Range Status   Specimen Description BLOOD RIGHT WRIST  Final   Special Requests   Final    BOTTLES DRAWN AEROBIC AND ANAEROBIC Blood Culture adequate volume   Culture   Final    NO GROWTH 2 DAYS Performed at [facility], [place] [address], [place], [place] [address]    Report Status PENDING  Incomplete  Blood Culture (routine x 2)     Status: None (Preliminary result)   Collection Time: 02/05/19  4:59 PM   Specimen: BLOOD  Result Value Ref Range Status   Specimen Description BLOOD BLOOD RIGHT FOREARM  Final   Special Requests   Final    BOTTLES DRAWN AEROBIC AND ANAEROBIC Blood Culture results may not be optimal due to an inadequate volume of blood received in culture bottles   Culture   Final    NO GROWTH 2 DAYS Performed at [facility], [place] [address], [place], [place] [address]    Report Status PENDING  Incomplete  Urine culture     Status: Abnormal   Collection Time: 02/06/19  4:31 AM   Specimen: In/Out Cath Urine  Result Value Ref Range Status   Specimen Description IN/OUT CATH URINE  Final   Special Requests   Final    NONE Performed at [facility], [place] [address], [place], [place] [address]    Culture >=100,000 COLONIES/mL  ENTEROCOCCUS FAECALIS (A)  Final   Report Status 02/08/2019 FINAL  Final  Organism ID, Bacteria ENTEROCOCCUS FAECALIS (A)  Final      Susceptibility   Enterococcus faecalis - MIC*    AMPICILLIN <=2 SENSITIVE Sensitive     LEVOFLOXACIN 1 SENSITIVE Sensitive     NITROFURANTOIN <=16 SENSITIVE Sensitive     VANCOMYCIN 1 SENSITIVE Sensitive     * >=100,000 COLONIES/mL ENTEROCOCCUS FAECALIS     Time coordinating discharge: 35 minutes  SIGNED:   Glade LloydKshitiz Jalee Saine, MD  [facility] 02/08/2019, 12:58 PM

## 2019-02-08 NOTE — Progress Notes (Signed)
This RN wasted pt's morphine dripwith Sena Hitch.  Wasted 90 mL in stericycle.

## 2019-02-08 NOTE — Progress Notes (Signed)
Patient ID: Deanna Page, female   DOB: 29-Oct-1929, 85 y.o.   MRN: [id number]  PROGRESS NOTE    [facility]  [id number] DOB: [birth year] DOA: 02/05/2019 PCP: Janifer Adie, MD   Brief Narrative:  85 year old female with history of Parkinson's disease, dementia, chronic kidney disease stage III, asthma, GERD, hypertension, hypothyroidism presented on 02/05/2019 with altered mental status and shortness of breath.  She was initially obtunded on presentation and required nonrebreather and was hypotensive with leukocytosis.  Chest x-ray showed diffuse hazy density throughout the left lung concerning for pneumonia.  CT of the head was negative for acute abnormality.  She was started on IV fluids and antibiotics.  Because of overall poor prognosis, palliative care was consulted.  After discussion with patient's family, family has decided to pursue residential hospice.  Assessment & Plan:   Possible aspiration pneumonia Severe sepsis: Present on admission Acute hypoxic respiratory failure: Present on admission Acute metabolic encephalopathy History of Parkinson's disease and advanced dementia Acute kidney injury on chronic kidney disease stage III Leukocytosis Anemia of chronic disease Hypertension Esophageal dysmotility with GERD  Generalized deconditioning  Plan -Patient was initially started on intravenous antibiotics and IV fluids.  She intermittently required nonrebreather and had to be given intermittent doses of morphine for air hunger.  After discussion with family and palliative care team, family has decided to pursue residential hospice.  Will discontinue IV antibiotics.  This morning, patient looks in more distress and even more tachypneic.  Will add IV Ativan on top of as needed morphine.  If patient continues to be in respiratory status, will start morphine drip.  If the patient stabilizes on morphine drip, will transfer the patient to residential hospice once bed is  available otherwise expect in-hospital death.  DVT prophylaxis: Comfort measures Code Status: DNR Family Communication: Spoke to husband/Robert at bedside on 02/07/2019 Disposition Plan: In hospital death versus residential hospice placement  Consultants:  palliative care   Procedures: None  Antimicrobials: Discontinue antibiotics   Subjective: Patient seen and examined at bedside.  Very poor historian.  Awake but hardly answers any questions.  Looks extremely dyspneic.   Objective: Vitals:   02/07/19 0834 02/07/19 1726 02/07/19 2310 02/08/19 0900  BP: (!) 145/81 136/67 (!) 140/58   Pulse: 88 95 90 (!) 101  Resp:   20 (!) 35  Temp: 98.5 F (36.9 C) 98.3 F (36.8 C) 98.1 F (36.7 C)   TempSrc: Oral Axillary Oral   SpO2: 98% 98% 94% (!) 84%  Weight:      Height:        Intake/Output Summary (Last 24 hours) at 02/08/2019 1034 Last data filed at 02/08/2019 0600 Gross per 24 hour  Intake --  Output 1850 ml  Net -1850 ml   Filed Weights   02/05/19 1700 02/05/19 1950  Weight: 53.1 kg 53 kg    Examination:  General exam: Elderly female lying in bed.  Looks chronically ill.  Looks in moderate distress secondary to dyspnea. Respiratory system: Bilateral decreased breath sounds at bases with scattered crackles.   Cardiovascular system: S1-S2 heard, rate controlled     Data Reviewed: I have personally reviewed following labs and imaging studies  CBC: Recent Labs  Lab 02/05/19 1645 02/05/19 2047 02/06/19 0449 02/07/19 0213  WBC 17.3* 16.9* 16.2* 15.6*  NEUTROABS 14.4*  --   --  13.2*  HGB 7.3* 7.2* 10.0* 9.7*  HCT 24.2* 25.0* 31.9* 28.9*  MCV 106.1* 112.6* 100.6* 96.3  PLT 356 291  303 330   Basic Metabolic Panel: Recent Labs  Lab 02/05/19 1529 02/05/19 2047 02/06/19 0449 02/07/19 0213  NA 147*  --  145 147*  K 4.4  --  3.6 3.4*  CL 110  --  117* 116*  CO2 24  --  21* 22  GLUCOSE 140*  --  155* 100*  BUN 30*  --  22 16  CREATININE 1.52* 1.15* 0.95 0.88   CALCIUM 9.0  --  7.7* 8.2*  MG  --   --   --  1.4*   GFR: Estimated Creatinine Clearance: 37.3 mL/min (by C-G formula based on SCr of 0.88 mg/dL). Liver Function Tests: Recent Labs  Lab 02/05/19 1529 02/06/19 0449  AST 56* 35  ALT 7 18  ALKPHOS 131* 107  BILITOT 0.5 0.6  PROT 6.7 5.5*  ALBUMIN 2.3* 1.8*   No results for input(s): LIPASE, AMYLASE in the last 168 hours. Recent Labs  Lab 02/05/19 1531  AMMONIA 14   Coagulation Profile: Recent Labs  Lab 02/05/19 1529 02/06/19 0449  INR 1.2 1.3*   Cardiac Enzymes: No results for input(s): CKTOTAL, CKMB, CKMBINDEX, TROPONINI in the last 168 hours. BNP (last 3 results) No results for input(s): PROBNP in the last 8760 hours. HbA1C: No results for input(s): HGBA1C in the last 72 hours. CBG: No results for input(s): GLUCAP in the last 168 hours. Lipid Profile: No results for input(s): CHOL, HDL, LDLCALC, TRIG, CHOLHDL, LDLDIRECT in the last 72 hours. Thyroid Function Tests: Recent Labs    02/05/19 1531  TSH 0.405   Anemia Panel: No results for input(s): VITAMINB12, FOLATE, FERRITIN, TIBC, IRON, RETICCTPCT in the last 72 hours. Sepsis Labs: Recent Labs  Lab 02/05/19 1529 02/06/19 0449  PROCALCITON  --  0.22  LATICACIDVEN 1.9  --     Recent Results (from the past 240 hour(s))  SARS CORONAVIRUS 2 (TAT 6-24 HRS) Nasopharyngeal Nasopharyngeal Swab     Status: None   Collection Time: 02/05/19  4:06 PM   Specimen: Nasopharyngeal Swab  Result Value Ref Range Status   SARS Coronavirus 2 NEGATIVE NEGATIVE Final    Comment: (NOTE) SARS-CoV-2 target nucleic acids are NOT DETECTED. The SARS-CoV-2 RNA is generally detectable in upper and lower respiratory specimens during the acute phase of infection. Negative results do not preclude SARS-CoV-2 infection, do not rule out co-infections with other pathogens, and should not be used as the sole basis for treatment or other patient management decisions. Negative results  must be combined with clinical observations, patient history, and epidemiological information. The expected result is Negative. Fact Sheet for Patients: HairSlick.nohttps://www.fda.gov/media/138098/download Fact Sheet for Healthcare Providers: quierodirigir.comhttps://www.fda.gov/media/138095/download This test is not yet approved or cleared by the [place] FDA and  has been authorized for detection and/or diagnosis of SARS-CoV-2 by FDA under an Emergency Use Authorization (EUA). This EUA will remain  in effect (meaning this test can be used) for the duration of the COVID-19 declaration under Section 56 4(b)(1) of the Act, 21 U.S.C. section 360bbb-3(b)(1), unless the authorization is terminated or revoked sooner. Performed at [facility], [address] [place], [place] [address]   Blood Culture (routine x 2)     Status: None (Preliminary result)   Collection Time: 02/05/19  4:26 PM   Specimen: BLOOD RIGHT WRIST  Result Value Ref Range Status   Specimen Description BLOOD RIGHT WRIST  Final   Special Requests   Final    BOTTLES DRAWN AEROBIC AND ANAEROBIC Blood Culture adequate volume  Culture   Final    NO GROWTH 2 DAYS Performed at [facility], [address] [place], [place] [address]    Report Status PENDING  Incomplete  Blood Culture (routine x 2)     Status: None (Preliminary result)   Collection Time: 02/05/19  4:59 PM   Specimen: BLOOD  Result Value Ref Range Status   Specimen Description BLOOD BLOOD RIGHT FOREARM  Final   Special Requests   Final    BOTTLES DRAWN AEROBIC AND ANAEROBIC Blood Culture results may not be optimal due to an inadequate volume of blood received in culture bottles   Culture   Final    NO GROWTH 2 DAYS Performed at [facility], [address], [place], [place] [address]    Report Status PENDING  Incomplete  Urine culture     Status: Abnormal   Collection Time: 02/06/19  4:31 AM   Specimen: In/Out Cath Urine  Result Value Ref Range Status    Specimen Description IN/OUT CATH URINE  Final   Special Requests   Final    NONE Performed at [facility], [address] [place], [place] [address]    Culture >=100,000 COLONIES/mL ENTEROCOCCUS FAECALIS (A)  Final   Report Status 02/08/2019 FINAL  Final   Organism ID, Bacteria ENTEROCOCCUS FAECALIS (A)  Final      Susceptibility   Enterococcus faecalis - MIC*    AMPICILLIN <=2 SENSITIVE Sensitive     LEVOFLOXACIN 1 SENSITIVE Sensitive     NITROFURANTOIN <=16 SENSITIVE Sensitive     VANCOMYCIN 1 SENSITIVE Sensitive     * >=100,000 COLONIES/mL ENTEROCOCCUS FAECALIS         Radiology Studies: Dg Chest Port 1 View  Result Date: 02/07/2019 CLINICAL DATA:  Dyspnea.  Asthma. EXAM: PORTABLE CHEST 1 VIEW COMPARISON:  02/05/2019 FINDINGS: Normal heart size. Interval complete atelectasis of the left lower lobe with veil like opacification of the left hemithorax. There is shift of the mediastinum into the left hemithorax with hyperexpansion of the right lung. Previous hardware fixation of the proximal left humerus. IMPRESSION: 1. Left hemithorax volume loss with veil like opacification of the anterior left lung favored to represent sequelae of complete left lower lobe atelectasis. Findings may be due to mucous plugging. Recommend follow-up imaging following respiratory therapy. If aeration to the left lung does not improve consider further investigation with CT of the chest to assess for underlying obstructing lesion. Electronically Signed   By: Signa Kell M.D.   On: 02/07/2019 08:59        Scheduled Meds:  chlorhexidine  15 mL Mouth Rinse BID   Chlorhexidine Gluconate Cloth  6 each Topical Daily   mouth rinse  15 mL Mouth Rinse q12n4p   Continuous Infusions:  sodium chloride     morphine     sodium chloride            Glade Lloyd, MD [facility] 02/08/2019, 10:34 AM

## 2019-02-08 NOTE — Progress Notes (Signed)
NCM spoke with pt's husband and was told his was ready to sign paperwork for [facility]. [facility] liaison/ [facility] made aware.  2:45 pm time establish for liaison and husband to meet to sign papers in front lobby of hospital. NCM will continue to monitor for needs... Whitman Hero RN,BSN,CM [phone]

## 2019-02-09 LAB — BPAM RBC
Blood Product Expiration Date: 202012312359
Blood Product Expiration Date: 202012312359
ISSUE DATE / TIME: 202012042031
Unit Type and Rh: 7300
Unit Type and Rh: 7300

## 2019-02-09 LAB — TYPE AND SCREEN
ABO/RH(D): B POS
Antibody Screen: NEGATIVE
Unit division: 0
Unit division: 0

## 2019-02-10 LAB — CULTURE, BLOOD (ROUTINE X 2)
Culture: NO GROWTH
Culture: NO GROWTH
Special Requests: ADEQUATE

## 2019-03-05 DEATH — deceased

## 2020-05-10 IMAGING — CT CT CERVICAL SPINE WITHOUT CONTRAST
5 of 8 series · 12 of 33 positions shown, 13 images · non-contrast
Comparison: Head CT 12/30/2016 and earlier.

CLINICAL DATA: 88-year-old female unwitnessed fall.

EXAM:
CT HEAD WITHOUT CONTRAST
CT CERVICAL SPINE WITHOUT CONTRAST
TECHNIQUE: Multidetector CT imaging of the head and cervical spine was
performed following the standard protocol without intravenous
contrast. Multiplanar CT image reconstructions of the cervical spine
were also generated.

[Series 4: head bone · axial · 0.40mm/px · z∈[-96,-44]mm · 2 of 80 slices shown]
[im 27/80  bone]
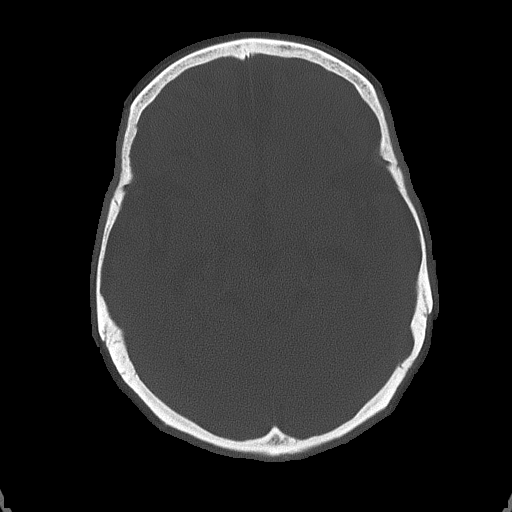
[im 53/80  bone]
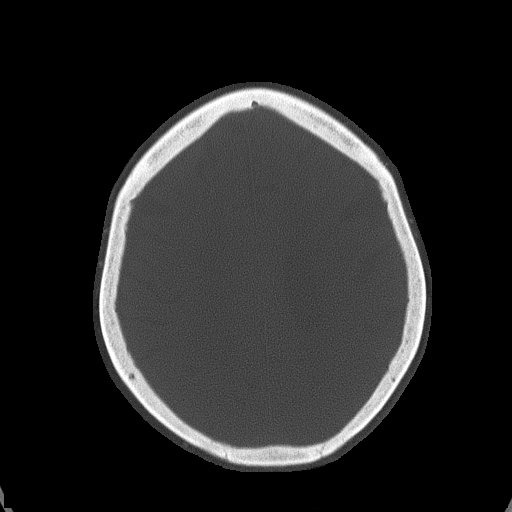

[Series 6: head without cor · coronal · non-contrast · 0.30mm/px · 2 of 67 slices shown]
[im 23/67  bone]
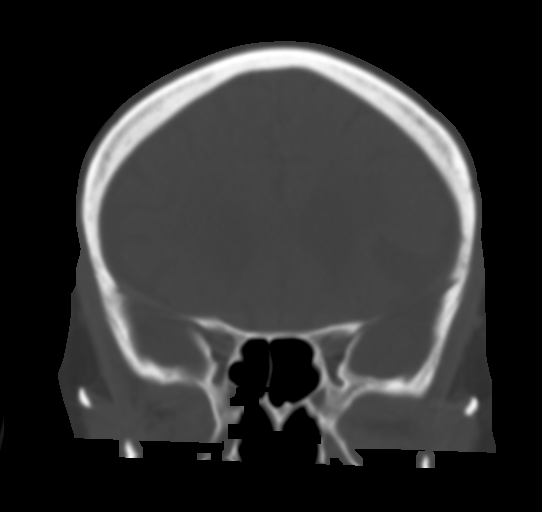
[im 45/67  bone]
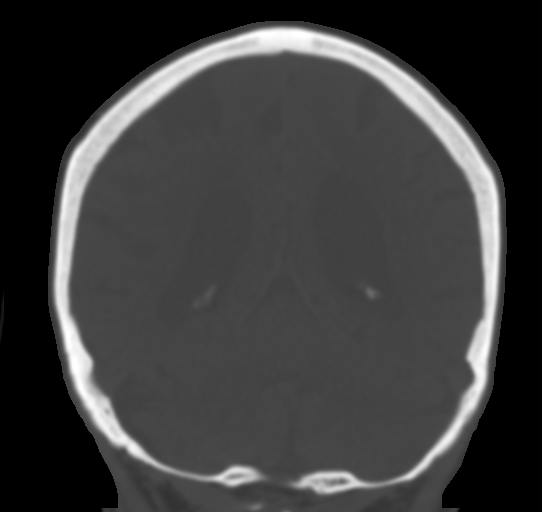

[Series 8: c_spine 2.0 st · axial · 0.25mm/px · z∈[-223,-179]mm · 2 of 66 slices shown, 3 images]
[im 22/66  soft-tissue]
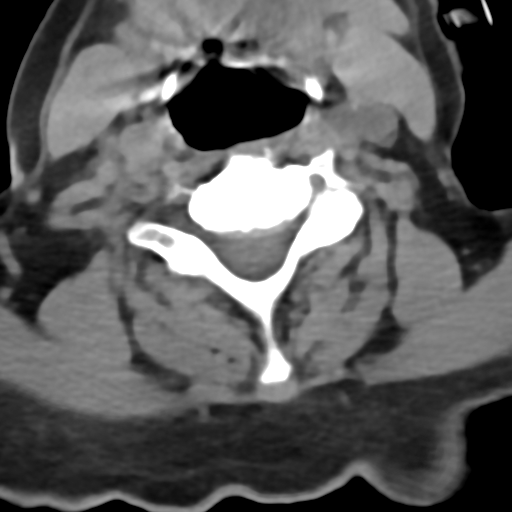
[im 22/66  bone]
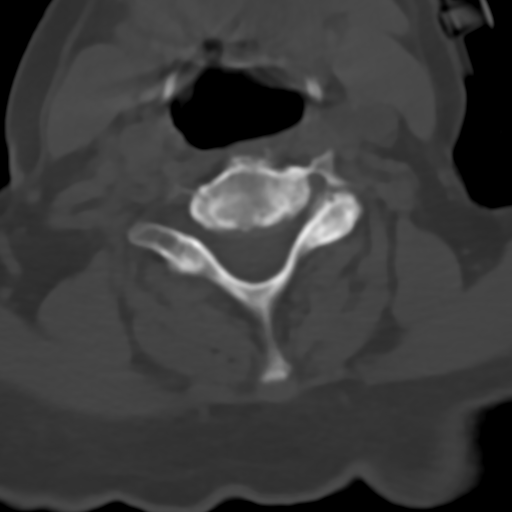
[im 44/66  bone]
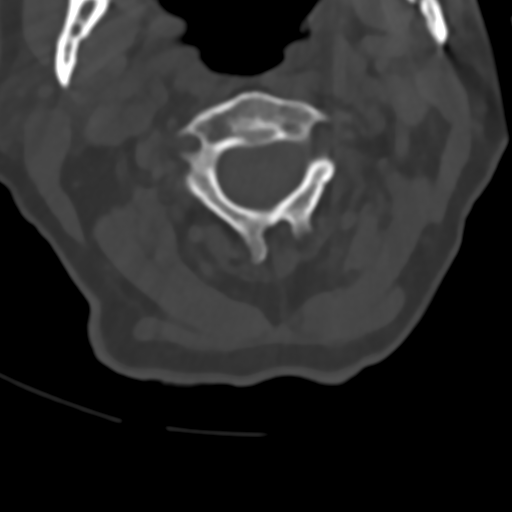

[Series 12: c_spine 2.0 sag bone · sagittal · 0.25mm/px · 4 of 61 slices shown]
[im 13/61  bone]
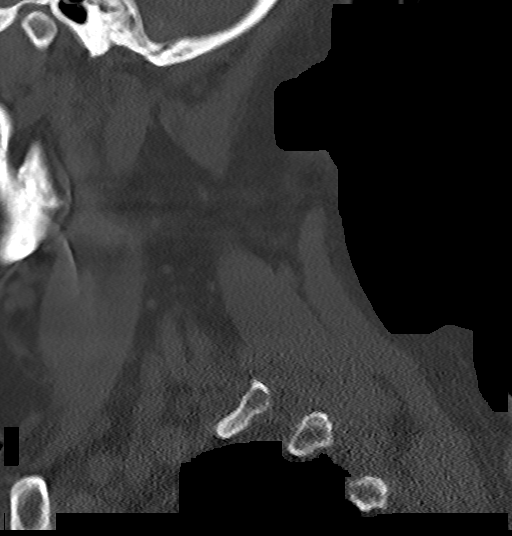
[im 25/61  bone]
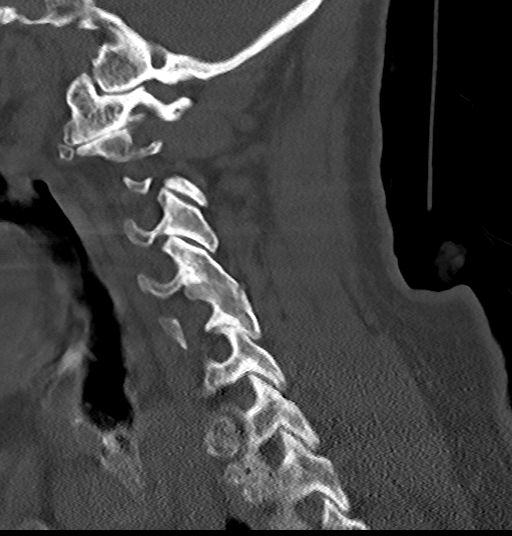
[im 37/61  bone]
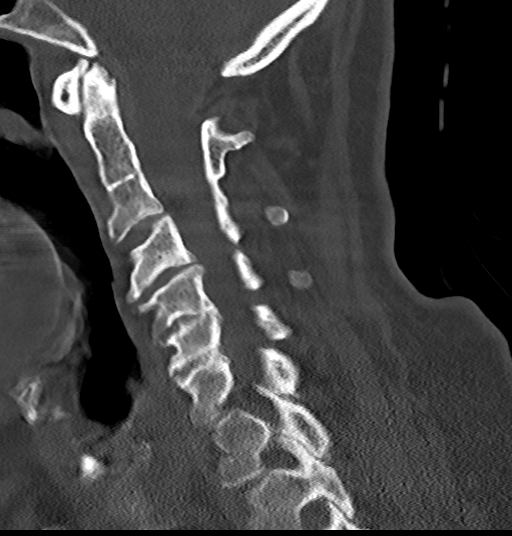
[im 49/61  bone]
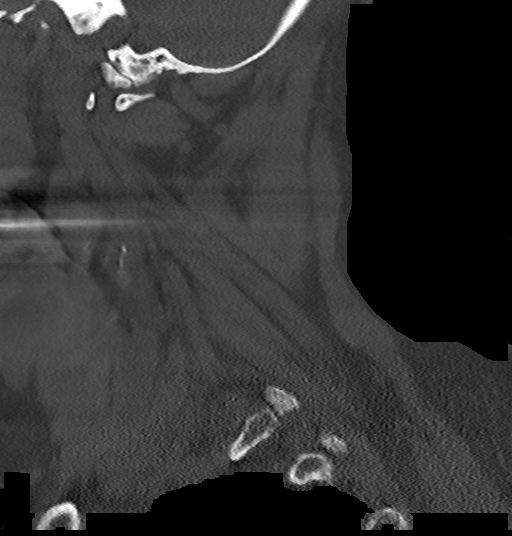

[Series 14: c_spine 2.0 orthogonals · axial · 0.21mm/px · z∈[-234,-194]mm · 2 of 77 slices shown]
[im 26/77  bone]
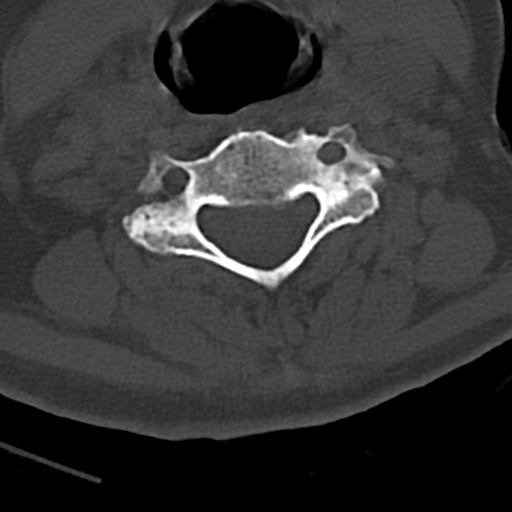
[im 51/77  bone]
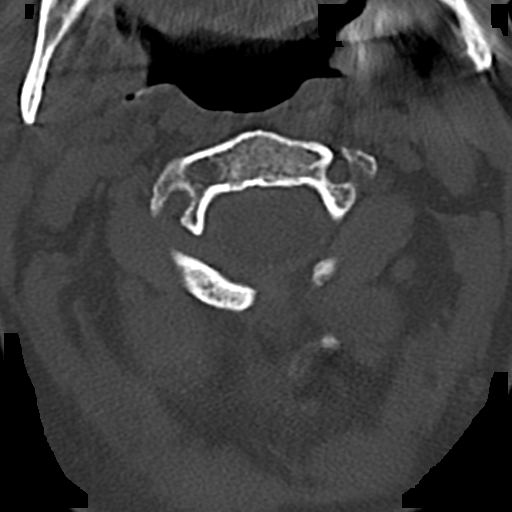

[12 of 33 positions shown; findings below may reference images not displayed]

FINDINGS: CT HEAD FINDINGS

Brain: Confluent hypodensity in the left occipital lobe affecting
gray and white matter most resembles an acute to subacute infarct
(series 3, image 16). Possible mild petechial hemorrhage but no
malignant hemorrhagic transformation. No significant mass effect.

Stable gray-white matter differentiation elsewhere with confluent
white matter hypodensity. No other intracranial blood products.

Vascular: Calcified atherosclerosis at the skull base. No suspicious
intracranial vascular hyperdensity.

Skull: No acute fracture identified.

Sinuses/Orbits: New right mastoid effusion since 8371, sparing the
mastoid antrum. The right tympanic cavity remains clear. The left
tympanic cavity remains clear but there is also mild new left
mastoid fluid. Visible paranasal sinuses are stable in clear.

Other: No scalp hematoma identified. Negative orbits.

CT CERVICAL SPINE FINDINGS

Alignment: Chronic straightening of cervical lordosis.
Cervicothoracic junction alignment is within normal limits.
Bilateral posterior element alignment is within normal limits.

Skull base and vertebrae: Stable skull base and craniocervical
junction alignment but severe chronic left C1-occipital condyle
degeneration. Congenital incomplete ossification of the posterior C1
arch. Superimposed congenital incomplete segmentation of C2-C3, and
ankylosis of the left C3-C4 posterior elements. Superimposed right
side C1-C2 joint degeneration also.

No acute osseous abnormality identified.

Soft tissues and spinal canal: No prevertebral fluid or swelling. No
visible canal hematoma.

Disc levels: Advanced degenerative change from the skull base to C2
as stated above. Incomplete segmentation of C2-C3 as stated above as
well as left C3-C4 and also C5-C6 posterior element ankylosis.

Upper chest: Visible upper thoracic levels appear grossly intact.
Negative visible lung apices.

Other: There is a proximal left humerus fracture visible on the
scout view.
IMPRESSION: 1. Acute to subacute appearing Left PCA territory infarct.
Possible associated Petechial Hemorrhage but no malignant
hemorrhagic transformation.
No intracranial mass effect.
2. No superimposed acute traumatic injury identified in the head or
cervical spine. Proximal left humerus fracture is visible on the
scout view.
3. Advanced craniocervical junction degeneration superimposed on
congenital and acquired cervical spine fusion.
